# Patient Record
Sex: Female | Born: 1955
Health system: Southern US, Community
[De-identification: ages and names within clinical notes are randomized; demographics above are authoritative.]

## PROBLEM LIST (undated history)

## (undated) DIAGNOSIS — K649 Unspecified hemorrhoids: Secondary | ICD-10-CM

## (undated) DIAGNOSIS — M8580 Other specified disorders of bone density and structure, unspecified site: Secondary | ICD-10-CM

## (undated) DIAGNOSIS — E039 Hypothyroidism, unspecified: Secondary | ICD-10-CM

## (undated) DIAGNOSIS — D229 Melanocytic nevi, unspecified: Secondary | ICD-10-CM

## (undated) DIAGNOSIS — C439 Malignant melanoma of skin, unspecified: Secondary | ICD-10-CM

## (undated) DIAGNOSIS — I10 Essential (primary) hypertension: Secondary | ICD-10-CM

## (undated) DIAGNOSIS — C801 Malignant (primary) neoplasm, unspecified: Secondary | ICD-10-CM

## (undated) DIAGNOSIS — D259 Leiomyoma of uterus, unspecified: Secondary | ICD-10-CM

## (undated) DIAGNOSIS — C4491 Basal cell carcinoma of skin, unspecified: Secondary | ICD-10-CM

## (undated) DIAGNOSIS — F419 Anxiety disorder, unspecified: Secondary | ICD-10-CM

## (undated) DIAGNOSIS — M1990 Unspecified osteoarthritis, unspecified site: Secondary | ICD-10-CM

## (undated) DIAGNOSIS — J309 Allergic rhinitis, unspecified: Secondary | ICD-10-CM

## (undated) DIAGNOSIS — E785 Hyperlipidemia, unspecified: Secondary | ICD-10-CM

## (undated) DIAGNOSIS — T7840XA Allergy, unspecified, initial encounter: Secondary | ICD-10-CM

## (undated) DIAGNOSIS — K602 Anal fissure, unspecified: Secondary | ICD-10-CM

## (undated) HISTORY — DX: Unspecified osteoarthritis, unspecified site: M19.90

## (undated) HISTORY — DX: Anxiety disorder, unspecified: F41.9

## (undated) HISTORY — DX: Unspecified hemorrhoids: K64.9

## (undated) HISTORY — DX: Anal fissure, unspecified: K60.2

## (undated) HISTORY — DX: Allergy, unspecified, initial encounter: T78.40XA

## (undated) HISTORY — DX: Leiomyoma of uterus, unspecified: D25.9

## (undated) HISTORY — DX: Malignant (primary) neoplasm, unspecified: C80.1

## (undated) HISTORY — PX: HEMORRHOID BANDING: SHX5850

## (undated) HISTORY — DX: Allergic rhinitis, unspecified: J30.9

## (undated) HISTORY — DX: Hyperlipidemia, unspecified: E78.5

## (undated) HISTORY — DX: Other specified disorders of bone density and structure, unspecified site: M85.80

## (undated) HISTORY — DX: Hypothyroidism, unspecified: E03.9

## (undated) HISTORY — DX: Essential (primary) hypertension: I10

## (undated) HISTORY — PX: OTHER SURGICAL HISTORY: SHX169

---
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
## 1986-02-12 HISTORY — PX: URETHRAL DILATION: SUR417

## 1993-02-12 HISTORY — PX: ABDOMINAL HYSTERECTOMY: SHX81

## 1997-03-26 ENCOUNTER — Encounter: Payer: Self-pay | Admitting: Family Medicine

## 1997-03-26 LAB — CONVERTED CEMR LAB: Blood Glucose, Fasting: 79 mg/dL

## 1997-06-29 ENCOUNTER — Encounter: Payer: Self-pay | Admitting: Family Medicine

## 1997-06-29 LAB — CONVERTED CEMR LAB: Blood Glucose, Fasting: 81 mg/dL

## 1997-08-20 ENCOUNTER — Ambulatory Visit (HOSPITAL_COMMUNITY): Admission: RE | Admit: 1997-08-20 | Discharge: 1997-08-20 | Payer: Self-pay | Admitting: Obstetrics & Gynecology

## 1999-09-04 ENCOUNTER — Encounter: Payer: Self-pay | Admitting: Family Medicine

## 1999-09-08 ENCOUNTER — Encounter: Payer: Self-pay | Admitting: Family Medicine

## 1999-09-08 LAB — CONVERTED CEMR LAB: TSH: 3.97 microintl units/mL

## 2000-02-13 HISTORY — PX: FLEXIBLE SIGMOIDOSCOPY: SHX1649

## 2001-08-25 ENCOUNTER — Other Ambulatory Visit: Admission: RE | Admit: 2001-08-25 | Discharge: 2001-08-25 | Payer: Self-pay | Admitting: Obstetrics & Gynecology

## 2002-09-24 ENCOUNTER — Other Ambulatory Visit: Admission: RE | Admit: 2002-09-24 | Discharge: 2002-09-24 | Payer: Self-pay | Admitting: Obstetrics & Gynecology

## 2003-07-22 ENCOUNTER — Encounter: Admission: RE | Admit: 2003-07-22 | Discharge: 2003-07-22 | Payer: Self-pay | Admitting: Obstetrics & Gynecology

## 2003-08-13 ENCOUNTER — Encounter: Admission: RE | Admit: 2003-08-13 | Discharge: 2003-08-13 | Payer: Self-pay | Admitting: Obstetrics & Gynecology

## 2003-08-27 ENCOUNTER — Encounter: Admission: RE | Admit: 2003-08-27 | Discharge: 2003-08-27 | Payer: Self-pay | Admitting: Obstetrics & Gynecology

## 2003-09-21 ENCOUNTER — Encounter: Admission: RE | Admit: 2003-09-21 | Discharge: 2003-09-21 | Payer: Self-pay | Admitting: Obstetrics & Gynecology

## 2003-09-29 ENCOUNTER — Other Ambulatory Visit: Admission: RE | Admit: 2003-09-29 | Discharge: 2003-09-29 | Payer: Self-pay | Admitting: Obstetrics & Gynecology

## 2004-10-20 ENCOUNTER — Other Ambulatory Visit: Admission: RE | Admit: 2004-10-20 | Discharge: 2004-10-20 | Payer: Self-pay | Admitting: Obstetrics & Gynecology

## 2004-11-03 ENCOUNTER — Ambulatory Visit: Payer: Self-pay | Admitting: Family Medicine

## 2004-12-18 ENCOUNTER — Ambulatory Visit: Payer: Self-pay | Admitting: Family Medicine

## 2005-05-17 ENCOUNTER — Ambulatory Visit: Payer: Self-pay | Admitting: Family Medicine

## 2005-06-28 ENCOUNTER — Ambulatory Visit: Payer: Self-pay | Admitting: Family Medicine

## 2005-08-28 ENCOUNTER — Ambulatory Visit: Payer: Self-pay | Admitting: Family Medicine

## 2005-08-31 ENCOUNTER — Ambulatory Visit: Payer: Self-pay | Admitting: Family Medicine

## 2005-12-24 ENCOUNTER — Ambulatory Visit: Payer: Self-pay | Admitting: Family Medicine

## 2006-01-11 DIAGNOSIS — Z85828 Personal history of other malignant neoplasm of skin: Secondary | ICD-10-CM

## 2006-01-11 HISTORY — DX: Personal history of other malignant neoplasm of skin: Z85.828

## 2006-01-24 ENCOUNTER — Encounter: Payer: Self-pay | Admitting: Family Medicine

## 2006-01-24 LAB — CONVERTED CEMR LAB: TSH: 6.375 microintl units/mL

## 2006-03-01 ENCOUNTER — Ambulatory Visit: Payer: Self-pay | Admitting: Family Medicine

## 2006-04-02 ENCOUNTER — Ambulatory Visit: Payer: Self-pay | Admitting: Family Medicine

## 2007-01-24 DIAGNOSIS — Z86018 Personal history of other benign neoplasm: Secondary | ICD-10-CM

## 2007-01-24 HISTORY — DX: Personal history of other benign neoplasm: Z86.018

## 2007-01-29 ENCOUNTER — Ambulatory Visit: Payer: Self-pay | Admitting: Family Medicine

## 2007-01-29 ENCOUNTER — Encounter: Payer: Self-pay | Admitting: Family Medicine

## 2007-01-29 DIAGNOSIS — E7800 Pure hypercholesterolemia, unspecified: Secondary | ICD-10-CM | POA: Insufficient documentation

## 2007-01-29 DIAGNOSIS — F329 Major depressive disorder, single episode, unspecified: Secondary | ICD-10-CM | POA: Insufficient documentation

## 2007-01-29 DIAGNOSIS — I10 Essential (primary) hypertension: Secondary | ICD-10-CM | POA: Insufficient documentation

## 2007-02-13 HISTORY — PX: COLONOSCOPY: SHX174

## 2007-03-03 ENCOUNTER — Ambulatory Visit: Payer: Self-pay | Admitting: Gastroenterology

## 2007-03-25 ENCOUNTER — Encounter: Payer: Self-pay | Admitting: Family Medicine

## 2007-03-25 ENCOUNTER — Ambulatory Visit: Payer: Self-pay | Admitting: Gastroenterology

## 2007-06-05 ENCOUNTER — Ambulatory Visit: Payer: Self-pay | Admitting: Family Medicine

## 2007-06-05 LAB — CONVERTED CEMR LAB
ALT: 15 units/L (ref 0–35)
AST: 19 units/L (ref 0–37)
Albumin: 3.5 g/dL (ref 3.5–5.2)
Basophils Absolute: 0 10*3/uL (ref 0.0–0.1)
Cholesterol: 218 mg/dL (ref 0–200)
Eosinophils Absolute: 0.1 10*3/uL (ref 0.0–0.7)
Eosinophils Relative: 1.5 % (ref 0.0–5.0)
GFR calc non Af Amer: 70 mL/min
Glucose, Bld: 89 mg/dL (ref 70–99)
HCT: 38.5 % (ref 36.0–46.0)
Lymphocytes Relative: 20.9 % (ref 12.0–46.0)
Monocytes Relative: 9.5 % (ref 3.0–12.0)
Neutrophils Relative %: 68.1 % (ref 43.0–77.0)
Platelets: 174 10*3/uL (ref 150–400)
Potassium: 4 meq/L (ref 3.5–5.1)
Sodium: 141 meq/L (ref 135–145)
Total Bilirubin: 0.4 mg/dL (ref 0.3–1.2)
VLDL: 15 mg/dL (ref 0–40)

## 2007-06-09 ENCOUNTER — Ambulatory Visit: Payer: Self-pay | Admitting: Family Medicine

## 2007-06-10 ENCOUNTER — Encounter: Payer: Self-pay | Admitting: Family Medicine

## 2007-06-18 ENCOUNTER — Encounter: Payer: Self-pay | Admitting: Family Medicine

## 2008-03-11 ENCOUNTER — Ambulatory Visit: Payer: Self-pay | Admitting: Family Medicine

## 2008-07-14 ENCOUNTER — Ambulatory Visit: Payer: Self-pay | Admitting: Family Medicine

## 2008-07-15 ENCOUNTER — Encounter (INDEPENDENT_AMBULATORY_CARE_PROVIDER_SITE_OTHER): Payer: Self-pay | Admitting: *Deleted

## 2008-07-15 ENCOUNTER — Ambulatory Visit: Payer: Self-pay | Admitting: Family Medicine

## 2008-07-15 LAB — CONVERTED CEMR LAB
AST: 20 units/L (ref 0–37)
Albumin: 3.3 g/dL — ABNORMAL LOW (ref 3.5–5.2)
Calcium: 8.7 mg/dL (ref 8.4–10.5)
Creatinine, Ser: 0.9 mg/dL (ref 0.4–1.2)
Direct LDL: 142.7 mg/dL
Total Bilirubin: 0.8 mg/dL (ref 0.3–1.2)
Total CHOL/HDL Ratio: 3
Total Protein: 6.3 g/dL (ref 6.0–8.3)
Triglycerides: 89 mg/dL (ref 0.0–149.0)

## 2009-02-02 ENCOUNTER — Telehealth: Payer: Self-pay | Admitting: Family Medicine

## 2009-05-09 ENCOUNTER — Ambulatory Visit: Payer: Self-pay | Admitting: Family Medicine

## 2009-05-09 DIAGNOSIS — J018 Other acute sinusitis: Secondary | ICD-10-CM | POA: Insufficient documentation

## 2009-08-01 ENCOUNTER — Ambulatory Visit: Payer: Self-pay | Admitting: Family Medicine

## 2009-08-01 LAB — CONVERTED CEMR LAB
BUN: 20 mg/dL (ref 6–23)
CO2: 30 meq/L (ref 19–32)
Cholesterol: 213 mg/dL — ABNORMAL HIGH (ref 0–200)
Direct LDL: 139.6 mg/dL
GFR calc non Af Amer: 67.61 mL/min (ref >60)
Glucose, Bld: 85 mg/dL (ref 70–99)
HDL: 53.5 mg/dL (ref >39.00)
Total Bilirubin: 0.4 mg/dL (ref 0.3–1.2)
Total Protein: 6.7 g/dL (ref 6.0–8.3)
VLDL: 20.4 mg/dL (ref 0.0–40.0)

## 2009-08-08 ENCOUNTER — Ambulatory Visit: Payer: Self-pay | Admitting: Family Medicine

## 2009-08-08 DIAGNOSIS — J309 Allergic rhinitis, unspecified: Secondary | ICD-10-CM | POA: Insufficient documentation

## 2009-12-19 ENCOUNTER — Ambulatory Visit: Payer: Self-pay | Admitting: Family Medicine

## 2009-12-19 DIAGNOSIS — N3941 Urge incontinence: Secondary | ICD-10-CM | POA: Insufficient documentation

## 2009-12-19 DIAGNOSIS — R3129 Other microscopic hematuria: Secondary | ICD-10-CM | POA: Insufficient documentation

## 2009-12-19 DIAGNOSIS — R109 Unspecified abdominal pain: Secondary | ICD-10-CM | POA: Insufficient documentation

## 2009-12-19 LAB — CONVERTED CEMR LAB
Ketones, urine, test strip: NEGATIVE
Nitrite: NEGATIVE
Specific Gravity, Urine: >=1.03
WBC Urine, dipstick: NEGATIVE
pH: 5

## 2009-12-20 ENCOUNTER — Encounter: Payer: Self-pay | Admitting: Family Medicine

## 2009-12-22 ENCOUNTER — Telehealth: Payer: Self-pay | Admitting: Family Medicine

## 2009-12-29 DIAGNOSIS — C439 Malignant melanoma of skin, unspecified: Secondary | ICD-10-CM

## 2009-12-29 HISTORY — DX: Malignant melanoma of skin, unspecified: C43.9

## 2010-03-16 NOTE — Assessment & Plan Note (Signed)
Summary: ACUTE-SORE SORE THROAT, SINUSES, ETC  CYD   Vital Signs:  Patient profile:   55 year old female Height:      58 inches Weight:      119 pounds BMI:     24.96 Temp:     99 degrees F oral Pulse rate:   88 / minute Pulse rhythm:   regular BP sitting:   108 / 72  (left arm) Cuff size:   regular  Vitals Entered By: Delilah Shan CMA (AAMA) (May 09, 2009 8:16 AM) CC: Cough, sinus congestion.   History of Present Illness: 55 yo with 1 week of sinus pressure, congestion. Started with sore throat and runny nose. Feels symptoms getting worse over last 3 days. Increased frontal pressure, had chills and subjective fever last night. Dry cough. No wheezing or SOB. Ears popping.  Current Medications (verified): 1)  Metoprolol Succinate 50 Mg Xr24h-Tab (Metoprolol Succinate) .... One Tab By Mouth At Night 2)  Cvs Magnesium 250 Mg  Tabs (Magnesium) .Marland Kitchen.. 1 Daily By Mouth 3)  Calcium 1200-1000 Mg-Unit  Chew (Calcium Carbonate-Vit D-Min) .Marland Kitchen.. 1 Daily By Mouth 4)  Vitamin D 1000 Unit  Caps (Cholecalciferol) .Marland Kitchen.. 1 Daily By Mouth 5)  Multivitamins  Tabs (Multiple Vitamin) .Marland Kitchen.. 1 Daily By Mouth 6)  Anusol-Hc 25 Mg Supp (Hydrocortisone Acetate) .... One Supp Per Rectum Three Times A Day For One Week Then As Needed. 7)  Estradiol 1 Mg Tabs (Estradiol) .... Take 1 Tablet By Mouth Once A Day 8)  Augmentin 875-125 Mg Tabs (Amoxicillin-Pot Clavulanate) .Marland Kitchen.. 1 By Mouth 2 Times Daily X 10 Days  Allergies (verified): No Known Drug Allergies  Review of Systems      See HPI General:  Complains of chills and fever. ENT:  Complains of sinus pressure. CV:  Denies chest pain or discomfort. Resp:  Complains of cough; denies shortness of breath, sputum productive, and wheezing.  Physical Exam  General:  Well-developed,well-nourished,in no acute distress; alert,appropriate and cooperative throughout examination Ears:  Mod clear fluid bilat. Nose:  nasal dischargemucosal pallor.   Mouth:  Oral  mucosa and oropharynx without lesions or exudates.  Teeth in good repair. Lungs:  Normal respiratory effort, chest expands symmetrically. Lungs are clear to auscultation, no crackles or wheezes. Heart:  Normal rate and regular rhythm. S1 and S2 normal without gallop, murmur, click, rub or other extra sounds. Extremities:  No clubbing, cyanosis, edema, or deformity noted with normal full range of motion of all joints.   Psych:  Cognition and judgment appear intact. Alert and cooperative with normal attention span and concentration. No apparent delusions, illusions, hallucinations   Impression & Recommendations:  Problem # 1:  OTHER ACUTE SINUSITIS (ICD-461.8) Assessment New Given duration and progression of symptoms, will treat for bacterial sinusitis with Augmetin. Cont supportive care.  See pt instructions for details. Her updated medication list for this problem includes:    Augmentin 875-125 Mg Tabs (Amoxicillin-pot clavulanate) .Marland Kitchen... 1 by mouth 2 times daily x 10 days  Complete Medication List: 1)  Metoprolol Succinate 50 Mg Xr24h-tab (Metoprolol succinate) .... One tab by mouth at night 2)  Cvs Magnesium 250 Mg Tabs (Magnesium) .Marland Kitchen.. 1 daily by mouth 3)  Calcium 1200-1000 Mg-unit Chew (Calcium carbonate-vit d-min) .Marland Kitchen.. 1 daily by mouth 4)  Vitamin D 1000 Unit Caps (Cholecalciferol) .Marland Kitchen.. 1 daily by mouth 5)  Multivitamins Tabs (Multiple vitamin) .Marland Kitchen.. 1 daily by mouth 6)  Anusol-hc 25 Mg Supp (Hydrocortisone acetate) .... One supp per rectum three times  a day for one week then as needed. 7)  Estradiol 1 Mg Tabs (Estradiol) .... Take 1 tablet by mouth once a day 8)  Augmentin 875-125 Mg Tabs (Amoxicillin-pot clavulanate) .Marland Kitchen.. 1 by mouth 2 times daily x 10 days  Patient Instructions: 1)  Take Augmentin as directed.  Drink lots of fluids.  Treat sympotmatically with Mucinex, nasal saline irrigation, and Tylenol/Ibuprofen. Also try claritin D or zyrtec D over the counter- two times a day as  needed ( have to sign for them at pharmacy). You can use warm compresses.  Cough suppressant at night. Call if not improving as expected in 5-7 days.  Prescriptions: AUGMENTIN 875-125 MG TABS (AMOXICILLIN-POT CLAVULANATE) 1 by mouth 2 times daily x 10 days  #20 x 0   Entered and Authorized by:   Ruthe Mannan MD   Signed by:   Ruthe Mannan MD on 05/09/2009   Method used:   Electronically to        [facility] (retail)       [address]       [place], [place]  [address]       Ph: [phone]       Fax: [phone]   RxID:   [id number]   Current Allergies (reviewed today): No known allergies

## 2010-03-16 NOTE — Assessment & Plan Note (Signed)
Summary: CPX AND ESTAB FROM [facility]   Vital Signs:  Patient profile:   55 year old female Height:      58.5 inches Weight:      118 pounds Temp:     98.6 degrees F oral Pulse rate:   64 / minute Pulse rhythm:   regular BP sitting:   122 / 80  (left arm) Cuff size:   regular  Vitals Entered By: Lowella Petties CMA (August 08, 2009 9:23 AM) CC: New patient to establish, transfer from Dr. Hetty Ely   History of Present Illness: 55 yo here for CPX and to transfer care to me.  HTN- on Metoprolol 50 mg daily. Over past several months, has noticed that her BP is running low, sometimes as low as 80s/60s, which makes her feel really bad. She gets dizzy and very fatigued. She has improved her diet and exercise since she started this higher dose of metoprolol  (was previously on 25 mg daily).  No CP, SOB, fevers or chills.  Well woman- goes to OBGYN for annual pap (s/p hysterectomy?) and mammogram. UTD on all prevention.  lipid panel stable from prior years.  Current Medications (verified): 1)  Cvs Magnesium 250 Mg  Tabs (Magnesium) .Marland Kitchen.. 1 Daily By Mouth 2)  Calcium 1200-1000 Mg-Unit  Chew (Calcium Carbonate-Vit D-Min) .Marland Kitchen.. 1 Daily By Mouth 3)  Vitamin D 1000 Unit  Caps (Cholecalciferol) .Marland Kitchen.. 1 Daily By Mouth 4)  Multivitamins  Tabs (Multiple Vitamin) .Marland Kitchen.. 1 Daily By Mouth 5)  Anusol-Hc 25 Mg Supp (Hydrocortisone Acetate) .... One Supp Per Rectum Three Times A Day For One Week Then As Needed. 6)  Estradiol 1 Mg Tabs (Estradiol) .... Take 1 Tablet By Mouth Once A Day 7)  Metoprolol Succinate 25 Mg Tb24 (Metoprolol Succinate) .... Take 1 Tab  By Mouth Daily  Allergies (verified): No Known Drug Allergies  Past History:  Past Surgical History: Last updated: 04/04/2007 PARTIAL HYSTERECTOMY; FIBROIDS :(1995) DR. Gerre Scull DILITATION :(1988) DR.BRADLEY FLEX TO 40 CM // INTERNAL AND EXTERNAL HEMORRHOIDS:(10/01/2000) COLONOSCOPY  SM EXT HEMMS  (03/25/2007)   10 YEARS  Family  History: Last updated: 08-03-08 Father: DECEASED AT 65 YOA CIRRHOSIS DUE TO ETOH ABUSE Mother: 58  HTN  Chol SISTER A 51 SISTER A 50 HTN CV: NEGATIVE HBP + MOTHER// MAUNT DM: NEGATIVE GOUT/ARTHRITIS: PROSTATE CANCER: NEGATIVE BREAST/OVARIAN/UTERINE CANCER: NEGATIVE COLON CANCER: LUNG --PAUNT DEPRESSION: ETOH/DRUG ABUSE: + FATHER  Social History: Last updated: 06/09/2007 Marital Status: DIVORCED Children: NONE Occupation: Conservator, museum/gallery asst, Community education officer.  Risk Factors: Alcohol Use: 0 (08-03-2008) Caffeine Use: 2 (Aug 03, 2008) Exercise: yes (08-03-2008)  Risk Factors: Smoking Status: quit (Aug 03, 2008) Packs/Day: 1991 (2008/08/03) Passive Smoke Exposure: no (08/03/2008)  Review of Systems      See HPI General:  Complains of fatigue. Eyes:  Denies blurring. ENT:  Denies difficulty swallowing. CV:  Denies chest pain or discomfort. Resp:  Denies shortness of breath. GI:  Denies abdominal pain, bloody stools, and change in bowel habits. GU:  Denies abnormal vaginal bleeding. MS:  Denies joint pain, joint redness, joint swelling, and muscle weakness. Derm:  Denies rash. Neuro:  Denies headaches. Psych:  Denies anxiety and depression. Endo:  Denies cold intolerance and heat intolerance. Heme:  Denies abnormal bruising.  Physical Exam  General:  Well-developed,well-nourished,in no acute distress; alert,appropriate and cooperative throughout examination Head:  Normocephalic and atraumatic without obvious abnormalities. No apparent alopecia or balding. Eyes:  Conjunctiva clear bilaterally.  Ears:  Mod clear fluid bilat. Nose:  nasal dischargemucosal pallor.  Mouth:  Oral mucosa and oropharynx without lesions or exudates.  Teeth in good repair. Lungs:  Normal respiratory effort, chest expands symmetrically. Lungs are clear to auscultation, no crackles or wheezes. Heart:  Normal rate and regular rhythm. S1 and S2 normal without gallop, murmur, click, rub or other extra  sounds. Abdomen:  Bowel sounds positive,abdomen soft and non-tender without masses, organomegaly or hernias noted. Msk:  No deformity or scoliosis noted of thoracic or lumbar spine.   Extremities:  No clubbing, cyanosis, edema, or deformity noted with normal full range of motion of all joints.   Neurologic:  No cranial nerve deficits noted. Station and gait are normal. Plantar reflexes are down-going bilaterally. DTRs are symmetrical throughout. Sensory, motor and coordinative functions appear intact. Skin:  Intact without suspicious lesions or rashes Psych:  Cognition and judgment appear intact. Alert and cooperative with normal attention span and concentration. No apparent delusions, illusions, hallucinations   Impression & Recommendations:  Problem # 1:  Preventive Health Care (ICD-V70.0) Reviewed preventive care protocols, scheduled due services, and updated immunizations Discussed nutrition, exercise, diet, and healthy lifestyle.  UTD on prevention.  Problem # 2:  HYPERTENSION, BENIGN ESSENTIAL (ICD-401.1) Assessment: Improved with new dizziness and fatigue. Will decrease metoprolol dose to 25 mg daily and have her call me in a few weeks with update of her BP. May eventually be able to stop the medication completely. The following medications were removed from the medication list:    Metoprolol Succinate 50 Mg Xr24h-tab (Metoprolol succinate) ..... One tab by mouth at night Her updated medication list for this problem includes:    Metoprolol Succinate 25 Mg Tb24 (Metoprolol succinate) .Marland Kitchen... Take 1 tab  by mouth daily  Problem # 3:  ALLERGIC RHINITIS CAUSE UNSPECIFIED (ICD-477.9) Assessment: New Discussed using claritin or other medications safe for a hypertensive.  Complete Medication List: 1)  Cvs Magnesium 250 Mg Tabs (Magnesium) .Marland Kitchen.. 1 daily by mouth 2)  Calcium 1200-1000 Mg-unit Chew (Calcium carbonate-vit d-min) .Marland Kitchen.. 1 daily by mouth 3)  Vitamin D 1000 Unit Caps  (Cholecalciferol) .Marland Kitchen.. 1 daily by mouth 4)  Multivitamins Tabs (Multiple vitamin) .Marland Kitchen.. 1 daily by mouth 5)  Anusol-hc 25 Mg Supp (Hydrocortisone acetate) .... One supp per rectum three times a day for one week then as needed. 6)  Estradiol 1 Mg Tabs (Estradiol) .... Take 1 tablet by mouth once a day 7)  Metoprolol Succinate 25 Mg Tb24 (Metoprolol succinate) .... Take 1 tab  by mouth daily Prescriptions: METOPROLOL SUCCINATE 25 MG TB24 (METOPROLOL SUCCINATE) Take 1 tab  by mouth daily  #180 x 3   Entered and Authorized by:   Ruthe Mannan MD   Signed by:   Ruthe Mannan MD on 08/08/2009   Method used:   Electronically to        [facility] (retail)       [address]       [place], [place]  [address]       Ph: [phone]       Fax: [phone]   RxID:   [id number]   Prior Medications (reviewed today): [facility]S MAGNESIUM 250 MG  TABS (MAGNESIUM) 1 daily by mouth CALCIUM 1200-1000 MG-UNIT  CHEW (CALCIUM CARBONATE-VIT D-MIN) 1 daily by mouth VITAMIN D 1000 UNIT  CAPS (CHOLECALCIFEROL) 1 daily by mouth MULTIVITAMINS  TABS (MULTIPLE VITAMIN) 1 daily by mouth ANUSOL-HC 25 MG SUPP (HYDROCORTISONE ACETATE) one supp per rectum three times a day for  one week then as needed. ESTRADIOL 1 MG TABS (ESTRADIOL) Take 1 tablet by mouth once a day Current Allergies (reviewed today): No known allergies   Flex Sig Next Due:  Not Indicated Hemoccult Next Due:  Not Indicated

## 2010-03-16 NOTE — Progress Notes (Signed)
Summary: Pt request call back  Phone Note Call from Patient   Reason for Call: Talk to Nurse Details for Reason: Pt requested call back Summary of Call: Pt left voice mail and requesting call back from nurse/doctor.  Pt asked to be called at work # [phone] Initial call taken by: Clarisa Schools,  December 22, 2009 9:15 AM  Follow-up for Phone Call        Patient advised as instructed via telephone regarding urine culture results.  She has an appt to see her gyn on 01/12/2010 and she will wait until after that appt to decide whether or not she wants a referral to urology.  See append dated 12/21/2009. Follow-up by: Linde Gillis CMA Duncan Dull),  December 22, 2009 9:26 AM

## 2010-03-16 NOTE — Assessment & Plan Note (Signed)
Summary: ?UTI/CLE   Vital Signs:  Patient profile:   55 year old female Height:      58.5 inches Weight:      121 pounds BMI:     24.95 Temp:     98.9 degrees F oral Pulse rate:   68 / minute Pulse rhythm:   regular BP sitting:   112 / 80  (left arm) Cuff size:   regular  Vitals Entered By: Linde Gillis CMA Duncan Dull) (December 19, 2009 10:50 AM) CC: ? UTI   History of Present Illness: 55 yo here with ? UTI.  One week ago, suprapubic pressure which resolved. H/o increased urinary frequency for sometime.  Sometimes does not make it to the bathroom. No incontinence with straing, cough or sneezing.  No back pain. Does have h/o kidney stones and h/o microscopic hematuria.  No fevers, chills, nausea or vomiting.    Current Medications (verified): 1)  Cvs Magnesium 250 Mg  Tabs (Magnesium) .Marland Kitchen.. 1 Daily By Mouth 2)  Calcium 1200-1000 Mg-Unit  Chew (Calcium Carbonate-Vit D-Min) .Marland Kitchen.. 1 Daily By Mouth 3)  Vitamin D 1000 Unit  Caps (Cholecalciferol) .Marland Kitchen.. 1 Daily By Mouth 4)  Multivitamins  Tabs (Multiple Vitamin) .Marland Kitchen.. 1 Daily By Mouth 5)  Anusol-Hc 25 Mg Supp (Hydrocortisone Acetate) .... One Supp Per Rectum Three Times A Day For One Week Then As Needed. 6)  Estradiol 1 Mg Tabs (Estradiol) .... Take 1 Tablet By Mouth Once A Day 7)  Metoprolol Succinate 25 Mg Tb24 (Metoprolol Succinate) .... Take 1 Tab  By Mouth Daily 8)  Detrol La 2 Mg  Cp24 (Tolterodine Tartrate) .Marland Kitchen.. 1 Tablet By Mouth Daily  Allergies (verified): No Known Drug Allergies  Past History:  Past Surgical History: Last updated: 04/04/2007 PARTIAL HYSTERECTOMY; FIBROIDS :(1995) DR. Gerre Scull DILITATION :(1988) DR.BRADLEY FLEX TO 40 CM // INTERNAL AND EXTERNAL HEMORRHOIDS:(10/01/2000) COLONOSCOPY  SM EXT HEMMS  (03/25/2007)   10 YEARS  Family History: Last updated: 2008/07/20 Father: DECEASED AT 78 YOA CIRRHOSIS DUE TO ETOH ABUSE Mother: 27  HTN  Chol SISTER A 51 SISTER A 50 HTN CV: NEGATIVE HBP + MOTHER//  MAUNT DM: NEGATIVE GOUT/ARTHRITIS: PROSTATE CANCER: NEGATIVE BREAST/OVARIAN/UTERINE CANCER: NEGATIVE COLON CANCER: LUNG --PAUNT DEPRESSION: ETOH/DRUG ABUSE: + FATHER  Social History: Last updated: 06/09/2007 Marital Status: DIVORCED Children: NONE Occupation: Conservator, museum/gallery asst, Community education officer.  Risk Factors: Alcohol Use: 0 (2008/07/20) Caffeine Use: 2 (2008/07/20) Exercise: yes (2008-07-20)  Risk Factors: Smoking Status: quit (07-20-08) Packs/Day: 1991 (07-20-2008) Passive Smoke Exposure: no (2008-07-20)  Review of Systems      See HPI General:  Denies chills and fever. GI:  Denies abdominal pain.  Physical Exam  General:  Well-developed,well-nourished,in no acute distress; alert,appropriate and cooperative throughout examination Abdomen:  Bowel sounds positive,abdomen soft and non-tender without masses, organomegaly or hernias noted. NO suprapubic or CVA tenderness Psych:  Cognition and judgment appear intact. Alert and cooperative with normal attention span and concentration. No apparent delusions, illusions, hallucinations   Impression & Recommendations:  Problem # 1:  MICROSCOPIC HEMATURIA (ICD-599.72) Assessment New Per pt, recurrent issue.  will send urine for cultlure. Strongly adivsed urological work up.  Pt will consider.  Problem # 2:  INCONTINENCE, URGE (ICD-788.31) Assessment: New Will start Detrol 2 mg daily. Discussed side effects.  Complete Medication List: 1)  Cvs Magnesium 250 Mg Tabs (Magnesium) .Marland Kitchen.. 1 daily by mouth 2)  Calcium 1200-1000 Mg-unit Chew (Calcium carbonate-vit d-min) .Marland Kitchen.. 1 daily by mouth 3)  Vitamin D 1000 Unit Caps (Cholecalciferol) .Marland KitchenMarland KitchenMarland Kitchen  1 daily by mouth 4)  Multivitamins Tabs (Multiple vitamin) .Marland Kitchen.. 1 daily by mouth 5)  Anusol-hc 25 Mg Supp (Hydrocortisone acetate) .... One supp per rectum three times a day for one week then as needed. 6)  Estradiol 1 Mg Tabs (Estradiol) .... Take 1 tablet by mouth once a day 7)  Metoprolol  Succinate 25 Mg Tb24 (Metoprolol succinate) .... Take 1 tab  by mouth daily 8)  Detrol La 2 Mg Cp24 (Tolterodine tartrate) .Marland Kitchen.. 1 tablet by mouth daily  Other Orders: UA Dipstick w/o Micro (manual) (71696) T-Culture, Urine ([id number]) Prescriptions: DETROL LA 2 MG  CP24 (TOLTERODINE TARTRATE) 1 tablet by mouth daily  #90 x 3   Entered and Authorized by:   Ruthe Mannan MD   Signed by:   Ruthe Mannan MD on 12/19/2009   Method used:   Electronically to        [facility] (retail)       [address]       [place], [place]  [address]       Ph: [phone]       Fax: [phone]   RxID:   [id number]    Orders Added: 1)  UA Dipstick w/o Micro (manual) [81002] 2)  T-Culture, Urine [[id number]] 3)  Est. Patient Level IV [25053]    Current Allergies (reviewed today): No known allergies   Laboratory Results   Urine Tests  Date/Time Received: December 19, 2009 10:59 AM   Routine Urinalysis   Color: yellow Appearance: Clear Glucose: negative   (Normal Range: Negative) Bilirubin: negative   (Normal Range: Negative) Ketone: negative   (Normal Range: Negative) Spec. Gravity: >=1.030   (Normal Range: 1.003-1.035) Blood: small   (Normal Range: Negative) pH: 5.0   (Normal Range: 5.0-8.0) Protein: trace   (Normal Range: Negative) Urobilinogen: 0.2   (Normal Range: 0-1) Nitrite: negative   (Normal Range: Negative) Leukocyte Esterace: negative   (Normal Range: Negative)       Appended Document: ?UTI/CLE

## 2010-08-21 ENCOUNTER — Other Ambulatory Visit: Payer: Self-pay | Admitting: *Deleted

## 2010-08-21 MED ORDER — METOPROLOL TARTRATE 25 MG PO TABS: 25.0000 mg | ORAL_TABLET | Freq: Two times a day (BID) | ORAL | Status: DC

## 2010-08-22 ENCOUNTER — Telehealth: Payer: Self-pay | Admitting: *Deleted

## 2010-08-22 MED ORDER — METOPROLOL SUCCINATE ER 25 MG PO TB24: 25.0000 mg | ORAL_TABLET | Freq: Every day | ORAL | Status: DC

## 2010-08-22 NOTE — Telephone Encounter (Signed)
Called pharmacy to call in Rx for Metoprolol Tartrate, was advised by pharmacist that patient is on succiante not tartrate.  Please advise.  The fax we received as for Tartrate.  Please advise.

## 2010-08-22 NOTE — Telephone Encounter (Signed)
Rx changed in Epic, rx sent to pharmacy.

## 2010-08-22 NOTE — Telephone Encounter (Signed)
Ok to switch to succinate.  Please change rx in epic. Thanks

## 2010-08-29 ENCOUNTER — Other Ambulatory Visit: Payer: Self-pay | Admitting: Family Medicine

## 2010-08-29 DIAGNOSIS — E7800 Pure hypercholesterolemia, unspecified: Secondary | ICD-10-CM

## 2010-08-29 DIAGNOSIS — Z0000 Encounter for general adult medical examination without abnormal findings: Secondary | ICD-10-CM

## 2010-09-04 ENCOUNTER — Other Ambulatory Visit (INDEPENDENT_AMBULATORY_CARE_PROVIDER_SITE_OTHER): Payer: BC Managed Care – PPO | Admitting: Family Medicine

## 2010-09-04 DIAGNOSIS — Z0000 Encounter for general adult medical examination without abnormal findings: Secondary | ICD-10-CM

## 2010-09-04 DIAGNOSIS — E7800 Pure hypercholesterolemia, unspecified: Secondary | ICD-10-CM

## 2010-09-04 LAB — LIPID PANEL
HDL: 77.9 mg/dL (ref >39.00)
Total CHOL/HDL Ratio: 3

## 2010-09-04 LAB — BASIC METABOLIC PANEL
CO2: 27 mEq/L (ref 19–32)
Calcium: 8.7 mg/dL (ref 8.4–10.5)
Creatinine, Ser: 1 mg/dL (ref 0.4–1.2)
GFR: 58.45 mL/min — ABNORMAL LOW (ref >60.00)

## 2010-09-04 LAB — LDL CHOLESTEROL, DIRECT: Direct LDL: 128.4 mg/dL

## 2010-09-08 ENCOUNTER — Encounter: Payer: Self-pay | Admitting: Family Medicine

## 2010-09-12 ENCOUNTER — Ambulatory Visit (INDEPENDENT_AMBULATORY_CARE_PROVIDER_SITE_OTHER): Payer: BC Managed Care – PPO | Admitting: Family Medicine

## 2010-09-12 ENCOUNTER — Encounter: Payer: Self-pay | Admitting: Family Medicine

## 2010-09-12 VITALS — BP 120/80 | HR 79 | Temp 98.6°F | Ht 58.75 in | Wt 115.5 lb

## 2010-09-12 DIAGNOSIS — Z0000 Encounter for general adult medical examination without abnormal findings: Secondary | ICD-10-CM | POA: Insufficient documentation

## 2010-09-12 DIAGNOSIS — I10 Essential (primary) hypertension: Secondary | ICD-10-CM

## 2010-09-12 DIAGNOSIS — E7800 Pure hypercholesterolemia, unspecified: Secondary | ICD-10-CM

## 2010-09-12 MED ORDER — METOPROLOL SUCCINATE 12.5 MG HALF TABLET: 12.5000 mg | ORAL_TABLET | Freq: Every day | ORAL | Status: DC

## 2010-09-12 NOTE — Progress Notes (Signed)
55 yo here for CPX .  HTN- on Metoprolol 25 mg daily, we cut back from 50 mg daily last year. No dizziness but does question if she can continue to cut back as BP has been great. Lab Results  Component Value Date   CREATININE 1.0 09/04/2010    Well woman- goes to OBGYN for annual pap (s/p hysterectomy?) and mammogram. UTD on all prevention.   Patient Active Problem List  Diagnoses  . PURE HYPERCHOLESTEROLEMIA  . DEPRESSION, MILD  . HYPERTENSION, BENIGN ESSENTIAL  . OTHER ACUTE SINUSITIS  . ALLERGIC RHINITIS CAUSE UNSPECIFIED  . MICROSCOPIC HEMATURIA  . INCONTINENCE, URGE  . ABDOMINAL PAIN OTHER SPECIFIED SITE   No past medical history on file. Past Surgical History  Procedure Date  . Abdominal hysterectomy 1995    partial, fibroids, Dr. Jennette Kettle  . Urethral dilation 1988    Dr. Elige Radon   History  Substance Use Topics  . Smoking status: Not on file  . Smokeless tobacco: Not on file  . Alcohol Use:    Family History  Problem Relation Age of Onset  . Hypertension Mother   . Hyperlipidemia Mother   . Alcohol abuse Father   . Cirrhosis Father   . Hypertension Maternal Aunt   . Cancer Paternal Aunt     lung  . Hypertension Sister    Allergies not on file Current Outpatient Prescriptions on File Prior to Visit  Medication Sig Dispense Refill  . Calcium 1200-1000 MG-UNIT CHEW Chew 1 capsule by mouth daily.        . cholecalciferol (VITAMIN D) 1000 UNITS tablet Take 1,000 Units by mouth daily.        Marland Kitchen estradiol (ESTRACE) 1 MG tablet Take 1 mg by mouth daily.        . hydrocortisone (ANUSOL-HC) 25 MG suppository Place 25 mg rectally as needed.        . Magnesium (CVS MAGNESIUM) 250 MG TABS Take 1 tablet by mouth daily.        . metoprolol succinate (TOPROL-XL) 25 MG 24 hr tablet Take 1 tablet (25 mg total) by mouth daily.  30 tablet  0  . Multiple Vitamin (MULTIVITAMIN) tablet Take 1 tablet by mouth daily.        Marland Kitchen tolterodine (DETROL LA) 2 MG 24 hr capsule Take 2 mg by  mouth daily.         The PMH, PSH, Social History, Family History, Medications, and allergies have been reviewed in [facility], and have been updated if relevant.   Review of Systems       See HPI General:  Complains of fatigue. Eyes:  Denies blurring. ENT:  Denies difficulty swallowing. CV:  Denies chest pain or discomfort. Resp:  Denies shortness of breath. GI:  Denies abdominal pain, bloody stools, and change in bowel habits. GU:  Denies abnormal vaginal bleeding. MS:  Denies joint pain, joint redness, joint swelling, and muscle weakness. Derm:  Denies rash. Neuro:  Denies headaches. Psych:  Denies anxiety and depression. Endo:  Denies cold intolerance and heat intolerance. Heme:  Denies abnormal bruising.  Physical Exam BP 120/80  Pulse 79  Temp(Src) 98.6 F (37 C) (Oral)  Ht 4' 10.75" (1.492 m)  Wt 115 lb 8 oz (52.39 kg)  BMI 23.53 kg/m2  General:  Well-developed,well-nourished,in no acute distress; alert,appropriate and cooperative throughout examination Head:  Normocephalic and atraumatic without obvious abnormalities. No apparent alopecia or balding. Eyes:  Conjunctiva clear bilaterally.  Ears:  Mod clear fluid bilat.  Nose:  nasal dischargemucosal pallor.   Mouth:  Oral mucosa and oropharynx without lesions or exudates.  Teeth in good repair. Lungs:  Normal respiratory effort, chest expands symmetrically. Lungs are clear to auscultation, no crackles or wheezes. Heart:  Normal rate and regular rhythm. S1 and S2 normal without gallop, murmur, click, rub or other extra sounds. Abdomen:  Bowel sounds positive,abdomen soft and non-tender without masses, organomegaly or hernias noted. Msk:  No deformity or scoliosis noted of thoracic or lumbar spine.   Extremities:  No clubbing, cyanosis, edema, or deformity noted with normal full range of motion of all joints.   Neurologic:  No cranial nerve deficits noted. Station and gait are normal. Plantar reflexes are down-going bilaterally.  DTRs are symmetrical throughout. Sensory, motor and coordinative functions appear intact. Skin:  Intact without suspicious lesions or rashes Psych:  Cognition and judgment appear intact. Alert and cooperative with normal attention span and concentration. No apparent delusions, illusions, hallucinations  Assessment and Plan: 1. Pure hypercholesterolemia  Lipids excellent, discussed results with pt.  2. HYPERTENSION, BENIGN ESSENTIAL  Stable.  Will decrease to 12.5 mg daily.  3. Routine general medical examination at a health care facility        Reviewed preventive care protocols, scheduled due services, and updated immunizations      Discussed nutrition, exercise, diet, and healthy lifestyle.

## 2010-10-06 ENCOUNTER — Encounter: Payer: Self-pay | Admitting: Family Medicine

## 2010-10-06 ENCOUNTER — Ambulatory Visit (INDEPENDENT_AMBULATORY_CARE_PROVIDER_SITE_OTHER): Payer: BC Managed Care – PPO | Admitting: Family Medicine

## 2010-10-06 VITALS — BP 130/90 | HR 84 | Temp 98.2°F | Wt 118.8 lb

## 2010-10-06 DIAGNOSIS — J329 Chronic sinusitis, unspecified: Secondary | ICD-10-CM

## 2010-10-06 MED ORDER — AMOXICILLIN 500 MG PO CAPS: 500.0000 mg | ORAL_CAPSULE | Freq: Two times a day (BID) | ORAL | Status: AC

## 2010-10-06 NOTE — Progress Notes (Signed)
SUBJECTIVE:  Terri Espinoza is a 55 y.o. female who complains of coryza, congestion, headache and teeth hurting for 21 days. She denies a history of fatigue, fevers, sweats, vomiting and weakness and denies a history of asthma. Patient denies smoke cigarettes. Using netty pot and claritin OTC   OBJECTIVE: BP 130/90  Pulse 84  Temp(Src) 98.2 F (36.8 C) (Oral)  Wt 118 lb 12 oz (53.865 kg)  She appears well, vital signs are as noted. Ears normal.  Throat and pharynx normal.  Neck supple. No adenopathy in the neck. Nose is congested. Sinuses non tender. The chest is clear, without wheezes or rales.  ASSESSMENT:  sinusitis  PLAN: Given duration and progression of symptoms, will treat for bacterial sinusitis with amoxicillin 500 mg twice daily x 10 days. Symptomatic therapy suggested: push fluids, rest and return office visit prn if symptoms persist or worsen. Call or return to clinic prn if these symptoms worsen or fail to improve as anticipated.

## 2010-10-24 ENCOUNTER — Telehealth: Payer: Self-pay | Admitting: *Deleted

## 2010-10-24 MED ORDER — METOPROLOL SUCCINATE ER 25 MG PO TB24: 25.0000 mg | ORAL_TABLET | Freq: Every day | ORAL | Status: DC

## 2010-10-24 NOTE — Telephone Encounter (Signed)
Pt states her metoprolol was cut back to half of a 25mg  tablet but now her BP is going up.  She has gotten readings of 148/102, 160/108.  She thinks her dose needs to go back up up.  She took 25 mg's yesterday and it went back down to 125/82.  Uses [facility].

## 2010-10-24 NOTE — Telephone Encounter (Signed)
Yes please increase dosage back 25 mg daily.

## 2010-10-24 NOTE — Telephone Encounter (Signed)
Patient advised as instructed via telephone.  Medication updated in Epic, Rx sent to pharmacy.

## 2011-06-05 ENCOUNTER — Other Ambulatory Visit: Payer: Self-pay | Admitting: *Deleted

## 2011-06-05 MED ORDER — METOPROLOL SUCCINATE ER 25 MG PO TB24: 25.0000 mg | ORAL_TABLET | Freq: Every day | ORAL | Status: DC

## 2011-07-02 ENCOUNTER — Encounter: Payer: Self-pay | Admitting: Family Medicine

## 2011-07-02 ENCOUNTER — Ambulatory Visit (INDEPENDENT_AMBULATORY_CARE_PROVIDER_SITE_OTHER): Payer: BC Managed Care – PPO | Admitting: Family Medicine

## 2011-07-02 VITALS — BP 124/80 | HR 64 | Temp 98.0°F | Wt 118.0 lb

## 2011-07-02 DIAGNOSIS — L237 Allergic contact dermatitis due to plants, except food: Secondary | ICD-10-CM | POA: Insufficient documentation

## 2011-07-02 DIAGNOSIS — L255 Unspecified contact dermatitis due to plants, except food: Secondary | ICD-10-CM

## 2011-07-02 MED ORDER — PREDNISONE 20 MG PO TABS: ORAL_TABLET | ORAL | Status: DC

## 2011-07-02 NOTE — Patient Instructions (Signed)
Poison Oak Poison oak is an inflammation of the skin (contact dermatitis). It is caused by contact with the allergens on the leaves of the oak (toxicodendron) plants. Depending on your sensitivity, the rash may consist simply of redness and itching, or it may also progress to blisters which may break open (rupture). These must be well cared for to prevent secondary germ (bacterial) infection as these infections can lead to scarring. The eyes may also get puffy. The puffiness is worst in the morning and gets better as the day progresses. Healing is best accomplished by keeping any open areas dry, clean, covered with a bandage, and covered with an antibacterial ointment if needed. Without secondary infection, this dermatitis usually heals without scarring within 2 to 3 weeks without treatment. HOME CARE INSTRUCTIONS When you have been exposed to poison oak, it is very important to thoroughly wash with soap and water as soon as the exposure has been discovered. You have about one half hour to remove the plant resin before it will cause the rash. This cleaning will quickly destroy the oil or antigen on the skin (the antigen is what causes the rash). Wash aggressively under the fingernails as any plant resin still there will continue to spread the rash. Do not rub skin vigorously when washing affected area. Poison oak cannot spread if no oil from the plant remains on your body. Rash that has progressed to weeping sores (lesions) will not spread the rash unless you have not washed thoroughly. It is also important to clean any clothes you have been wearing as they may carry active allergens which will spread the rash, even several days later. Avoidance of the plant in the future is the best measure. Poison oak plants can be recognized by the number of leaves. Generally, poison oak has three leaves with flowering branches on a single stem. Diphenhydramine may be purchased over the counter and used as needed for  itching. Do not drive with this medication if it makes you drowsy. Ask your caregiver about medication for children. SEEK IMMEDIATE MEDICAL CARE IF:   Open areas of the rash develop.   You notice redness extending beyond the area of the rash.   There is a pus like discharge.   There is increased pain.   Other signs of infection develop (such as fever).  Document Released: 08/05/2002 Document Revised: 01/18/2011 Document Reviewed: 12/15/2008 [employer] Patient Information 2012 [employer]. 

## 2011-07-02 NOTE — Progress Notes (Signed)
  Subjective:    Patient ID: Terri Espinoza, female    DOB: 13-Sep-1955, 56 y.o.   MRN: [id number]  HPI  56 yo was working in yard over the weekend, pulling weeds.  Noticed a little itchy spot, raised on inner thighs bilaterally, now has spread to left arm, bilateral hands, abdomen.  Very itchy.  No wheezing or SOB.  Patient Active Problem List  Diagnoses  . PURE HYPERCHOLESTEROLEMIA  . DEPRESSION, MILD  . HYPERTENSION, BENIGN ESSENTIAL  . OTHER ACUTE SINUSITIS  . ALLERGIC RHINITIS CAUSE UNSPECIFIED  . MICROSCOPIC HEMATURIA  . INCONTINENCE, URGE  . ABDOMINAL PAIN OTHER SPECIFIED SITE  . Routine general medical examination at a health care facility   No past medical history on file. Past Surgical History  Procedure Date  . Abdominal hysterectomy 1995    partial, fibroids, Dr. Jennette Kettle  . Urethral dilation 1988    Dr. Elige Radon   History  Substance Use Topics  . Smoking status: Never Smoker   . Smokeless tobacco: Not on file  . Alcohol Use: Not on file   Family History  Problem Relation Age of Onset  . Hypertension Mother   . Hyperlipidemia Mother   . Alcohol abuse Father   . Cirrhosis Father   . Hypertension Maternal Aunt   . Cancer Paternal Aunt     lung  . Hypertension Sister    No Known Allergies Current Outpatient Prescriptions on File Prior to Visit  Medication Sig Dispense Refill  . Calcium 1200-1000 MG-UNIT CHEW Chew 1 capsule by mouth daily.        . cholecalciferol (VITAMIN D) 1000 UNITS tablet Take 1,000 Units by mouth daily.        Marland Kitchen estradiol (ESTRACE) 1 MG tablet Take 1 mg by mouth daily.        . hydrocortisone (ANUSOL-HC) 25 MG suppository Place 25 mg rectally as needed.        . metoprolol succinate (TOPROL-XL) 25 MG 24 hr tablet Take 1 tablet (25 mg total) by mouth daily.  30 tablet  3  . Multiple Vitamin (MULTIVITAMIN) tablet Take 1 tablet by mouth daily.         The PMH, PSH, Social History, Family History, Medications, and allergies have been  reviewed in [facility], and have been updated if relevant.    Review of Systems    See HPI Objective:   Physical Exam BP 124/80  Pulse 64  Temp(Src) 98 F (36.7 C) (Oral)  Wt 118 lb (53.524 kg) Gen:  Alert, pleasant, NAD Skin: multiple raised, linear, erythematous lesions consistent with contact dermatitis on left arm, bilaterally hands, inner thighs and abdomen.  No surrounding erythema or drainage.     Assessment & Plan:   1. Plant allergic contact dermatitis    New- see pt instructions for details. Given wide spread lesions- treat with oral prednisone taper.  Continue antihistamines as directed. The patient indicates understanding of these issues and agrees with the plan.

## 2011-10-02 ENCOUNTER — Other Ambulatory Visit: Payer: Self-pay | Admitting: Family Medicine

## 2011-11-30 ENCOUNTER — Other Ambulatory Visit: Payer: Self-pay | Admitting: Family Medicine

## 2011-11-30 DIAGNOSIS — E7800 Pure hypercholesterolemia, unspecified: Secondary | ICD-10-CM

## 2011-11-30 DIAGNOSIS — Z0000 Encounter for general adult medical examination without abnormal findings: Secondary | ICD-10-CM

## 2011-11-30 DIAGNOSIS — I10 Essential (primary) hypertension: Secondary | ICD-10-CM

## 2011-12-03 ENCOUNTER — Other Ambulatory Visit (INDEPENDENT_AMBULATORY_CARE_PROVIDER_SITE_OTHER): Payer: BC Managed Care – PPO

## 2011-12-03 DIAGNOSIS — E7800 Pure hypercholesterolemia, unspecified: Secondary | ICD-10-CM

## 2011-12-03 DIAGNOSIS — I10 Essential (primary) hypertension: Secondary | ICD-10-CM

## 2011-12-03 LAB — COMPREHENSIVE METABOLIC PANEL
AST: 18 U/L (ref 0–37)
BUN: 17 mg/dL (ref 6–23)
Calcium: 8.8 mg/dL (ref 8.4–10.5)
Chloride: 105 mEq/L (ref 96–112)
Creatinine, Ser: 0.9 mg/dL (ref 0.4–1.2)

## 2011-12-03 LAB — LIPID PANEL
Cholesterol: 239 mg/dL — ABNORMAL HIGH (ref 0–200)
HDL: 63.2 mg/dL (ref >39.00)
Triglycerides: 118 mg/dL (ref 0.0–149.0)

## 2011-12-03 LAB — LDL CHOLESTEROL, DIRECT: Direct LDL: 140 mg/dL

## 2011-12-10 ENCOUNTER — Encounter: Payer: Self-pay | Admitting: Family Medicine

## 2011-12-10 ENCOUNTER — Ambulatory Visit (INDEPENDENT_AMBULATORY_CARE_PROVIDER_SITE_OTHER): Payer: BC Managed Care – PPO | Admitting: Family Medicine

## 2011-12-10 VITALS — BP 140/82 | HR 72 | Temp 98.3°F | Ht 58.5 in | Wt 121.0 lb

## 2011-12-10 DIAGNOSIS — E7800 Pure hypercholesterolemia, unspecified: Secondary | ICD-10-CM

## 2011-12-10 DIAGNOSIS — F329 Major depressive disorder, single episode, unspecified: Secondary | ICD-10-CM

## 2011-12-10 DIAGNOSIS — R5381 Other malaise: Secondary | ICD-10-CM

## 2011-12-10 DIAGNOSIS — R5383 Other fatigue: Secondary | ICD-10-CM

## 2011-12-10 DIAGNOSIS — J309 Allergic rhinitis, unspecified: Secondary | ICD-10-CM

## 2011-12-10 DIAGNOSIS — Z0000 Encounter for general adult medical examination without abnormal findings: Secondary | ICD-10-CM

## 2011-12-10 DIAGNOSIS — I10 Essential (primary) hypertension: Secondary | ICD-10-CM

## 2011-12-10 DIAGNOSIS — F3289 Other specified depressive episodes: Secondary | ICD-10-CM

## 2011-12-10 LAB — CBC WITH DIFFERENTIAL/PLATELET
Basophils Absolute: 0 10*3/uL (ref 0.0–0.1)
Basophils Relative: 1 % (ref 0.0–3.0)
HCT: 36.9 % (ref 36.0–46.0)
Hemoglobin: 12.3 g/dL (ref 12.0–15.0)
MCHC: 33.2 g/dL (ref 30.0–36.0)
MCV: 93.4 fl (ref 78.0–100.0)
Neutro Abs: 3.5 10*3/uL (ref 1.4–7.7)
RBC: 3.95 Mil/uL (ref 3.87–5.11)

## 2011-12-10 MED ORDER — HYDROCORTISONE ACETATE 25 MG RE SUPP: 25.0000 mg | RECTAL | Status: DC | PRN

## 2011-12-10 MED ORDER — FLUTICASONE PROPIONATE 50 MCG/ACT NA SUSP: 2.0000 | Freq: Every day | NASAL | Status: DC

## 2011-12-10 NOTE — Progress Notes (Signed)
56 yo very pleasant female here for CPX .  Allergic rhinitis- deteriorated.  Increased runny nose, sinus pressure past several months.  Takes Allegra but it makes her sleepy so she often doesn't take anything.  HTN- on Metoprolol 12.5 mg daily now ( we have been slowing decreasing the dose).  Lab Results  Component Value Date   CREATININE 0.9 12/03/2011    Well woman- goes to OBGYN for annual pap (s/p hysterectomy?), bone density and mammogram- next appointment in January. UTD on all prevention.  Does  yearly stool cards with Dr. Jennette Kettle. Lab Results  Component Value Date   CHOL 239* 12/03/2011   HDL 63.20 12/03/2011   LDLDIRECT 140.0 12/03/2011   TRIG 118.0 12/03/2011   CHOLHDL 4 12/03/2011   She has been more fatigued lately.  Denies any CP or SOB.  Would like her thyroid checked today.  Patient Active Problem List  Diagnosis  . PURE HYPERCHOLESTEROLEMIA  . DEPRESSION, MILD  . HYPERTENSION, BENIGN ESSENTIAL  . OTHER ACUTE SINUSITIS  . ALLERGIC RHINITIS CAUSE UNSPECIFIED  . MICROSCOPIC HEMATURIA  . INCONTINENCE, URGE  . ABDOMINAL PAIN OTHER SPECIFIED SITE  . Routine general medical examination at a health care facility  . Plant allergic contact dermatitis   No past medical history on file. Past Surgical History  Procedure Date  . Abdominal hysterectomy 1995    partial, fibroids, Dr. Jennette Kettle  . Urethral dilation 1988    Dr. Elige Radon   History  Substance Use Topics  . Smoking status: Never Smoker   . Smokeless tobacco: Not on file  . Alcohol Use: Not on file   Family History  Problem Relation Age of Onset  . Hypertension Mother   . Hyperlipidemia Mother   . Alcohol abuse Father   . Cirrhosis Father   . Hypertension Maternal Aunt   . Cancer Paternal Aunt     lung  . Hypertension Sister    No Known Allergies Current Outpatient Prescriptions on File Prior to Visit  Medication Sig Dispense Refill  . Calcium 1200-1000 MG-UNIT CHEW Chew 1 capsule by mouth daily.         . cholecalciferol (VITAMIN D) 1000 UNITS tablet Take 1,000 Units by mouth daily.        Marland Kitchen estradiol (ESTRACE) 1 MG tablet Take 1 mg by mouth daily.        . hydrocortisone (ANUSOL-HC) 25 MG suppository Place 25 mg rectally as needed.        . metoprolol succinate (TOPROL-XL) 25 MG 24 hr tablet TAKE ONE (1) TABLET EACH DAY  30 tablet  2  . Multiple Vitamin (MULTIVITAMIN) tablet Take 1 tablet by mouth daily.        . predniSONE (DELTASONE) 20 MG tablet 3 tabs by mouth daily x 3 days, 2 tabs by mouth daily x 2 days, 1 tab by mouth daily x 2 days, 1/2 tab by mouth x 2 days. Dispense qus  1 tablet  0   The PMH, PSH, Social History, Family History, Medications, and allergies have been reviewed in [facility], and have been updated if relevant.   Review of Systems       See HPI Patient reports no  vision/ hearing changes,anorexia, weight change, fever ,adenopathy, persistant / recurrent hoarseness, swallowing issues, chest pain, edema,persistant / recurrent cough, hemoptysis, dyspnea(rest, exertional, paroxysmal nocturnal), gastrointestinal  bleeding (melena, rectal bleeding), abdominal pain, excessive heart burn, GU symptoms(dysuria, hematuria, pyuria, voiding/incontinence  Issues) syncope, focal weakness, severe memory loss, concerning skin lesions, depression,  anxiety, abnormal bruising/bleeding, major joint swelling, breast masses or abnormal vaginal bleeding.     Physical Exam BP 140/82  Pulse 72  Temp 98.3 F (36.8 C)  Ht 4' 10.5" (1.486 m)  Wt 121 lb (54.885 kg)  BMI 24.86 kg/m2  General:  Well-developed,well-nourished,in no acute distress; alert,appropriate and cooperative throughout examination Head:  Normocephalic and atraumatic without obvious abnormalities. No apparent alopecia or balding. Eyes:  Conjunctiva clear bilaterally.  Ears:  Mod clear fluid bilat. Nose:  nasal dischargemucosal pallor.   Mouth:  Oral mucosa and oropharynx without lesions or exudates.  Teeth in good  repair. Lungs:  Normal respiratory effort, chest expands symmetrically. Lungs are clear to auscultation, no crackles or wheezes. Heart:  Normal rate and regular rhythm. S1 and S2 normal without gallop, murmur, click, rub or other extra sounds. Abdomen:  Bowel sounds positive,abdomen soft and non-tender without masses, organomegaly or hernias noted. Msk:  No deformity or scoliosis noted of thoracic or lumbar spine.   Extremities:  No clubbing, cyanosis, edema, or deformity noted with normal full range of motion of all joints.   Neurologic:  No cranial nerve deficits noted. Station and gait are normal. Plantar reflexes are down-going bilaterally. DTRs are symmetrical throughout. Sensory, motor and coordinative functions appear intact. Skin:  Intact without suspicious lesions or rashes Psych:  Cognition and judgment appear intact. Alert and cooperative with normal attention span and concentration. No apparent delusions, illusions, hallucinations  Assessment and Plan:  1. Routine general medical examination at a health care facility Reviewed preventive care protocols, scheduled due services, and updated immunizations Discussed nutrition, exercise, diet, and healthy lifestyle.     2. HYPERTENSION, BENIGN ESSENTIAL  Stable on current dose of toprol.   3. DEPRESSION, MILD    4. Pure hypercholesterolemia  LDL mildly elevated- discussed cutting out fast food, fried foods, saturated fats.   5. Allergic rhinitis  Deteriorated.  Rx sent for flonase.   6. Fatigue  Deteriorated.  Likely due to increased stressors.  Will check labs to rule out other possible causes. The patient indicates understanding of these issues and agrees with the plan.  TSH, T4, Free, CBC with Differential

## 2011-12-10 NOTE — Patient Instructions (Addendum)
We are checking your thyroid function and your CBC (rule out anemia) today.  We will call you with your lab results.  Try flonase- ok to use with Allegra.

## 2011-12-11 ENCOUNTER — Encounter: Payer: Self-pay | Admitting: *Deleted

## 2012-01-01 ENCOUNTER — Other Ambulatory Visit: Payer: Self-pay | Admitting: Family Medicine

## 2012-01-30 ENCOUNTER — Encounter: Payer: Self-pay | Admitting: Family Medicine

## 2012-01-30 ENCOUNTER — Ambulatory Visit (INDEPENDENT_AMBULATORY_CARE_PROVIDER_SITE_OTHER): Payer: BC Managed Care – PPO | Admitting: Family Medicine

## 2012-01-30 VITALS — BP 130/90 | HR 76 | Temp 97.9°F | Wt 124.0 lb

## 2012-01-30 DIAGNOSIS — S43429A Sprain of unspecified rotator cuff capsule, initial encounter: Secondary | ICD-10-CM

## 2012-01-30 MED ORDER — MELOXICAM 15 MG PO TABS: 15.0000 mg | ORAL_TABLET | Freq: Every day | ORAL | Status: DC

## 2012-01-30 NOTE — Patient Instructions (Addendum)
You have rotator cuff impingement Try to avoid painful activities (overhead activities, lifting with extended arm) as much as possible. Meloxicam and/or tylenol as needed for pain. Subacromial injection may be beneficial to help with pain and to decrease inflammation.   Do home exercise program as tolerated.  If not improving at follow-up we will consider further imaging and/or physical therapy.

## 2012-01-30 NOTE — Progress Notes (Signed)
SUBJECTIVE: Terri Espinoza is a 56 y.o. female who complains of right shoulder pain x 4 days.  No known injury.  Pain starts in her shoulder and goes all the way down to her right thumb.  No UE weakness.  Most difficultly lifting arm over head or behind her back.    Prior history of related problems: no prior problems with this area in the past.  Patient Active Problem List  Diagnosis  . PURE HYPERCHOLESTEROLEMIA  . DEPRESSION, MILD  . HYPERTENSION, BENIGN ESSENTIAL  . OTHER ACUTE SINUSITIS  . ALLERGIC RHINITIS CAUSE UNSPECIFIED  . MICROSCOPIC HEMATURIA  . INCONTINENCE, URGE  . ABDOMINAL PAIN OTHER SPECIFIED SITE  . Routine general medical examination at a health care facility  . Plant allergic contact dermatitis  . Allergic rhinitis   No past medical history on file. Past Surgical History  Procedure Date  . Abdominal hysterectomy 1995    partial, fibroids, Dr. Jennette Kettle  . Urethral dilation 1988    Dr. Elige Radon   History  Substance Use Topics  . Smoking status: Former Games developer  . Smokeless tobacco: Not on file  . Alcohol Use: Not on file   Family History  Problem Relation Age of Onset  . Hypertension Mother   . Hyperlipidemia Mother   . Alcohol abuse Father   . Cirrhosis Father   . Hypertension Maternal Aunt   . Cancer Paternal Aunt     lung  . Hypertension Sister    No Known Allergies Current Outpatient Prescriptions on File Prior to Visit  Medication Sig Dispense Refill  . Calcium 1200-1000 MG-UNIT CHEW Chew 1 capsule by mouth daily.        . cholecalciferol (VITAMIN D) 1000 UNITS tablet Take 1,000 Units by mouth daily.        Marland Kitchen estradiol (ESTRACE) 1 MG tablet Take 1 mg by mouth daily.        . fluticasone (FLONASE) 50 MCG/ACT nasal spray Place 2 sprays into the nose daily.  16 g  6  . hydrocortisone (ANUSOL-HC) 25 MG suppository Place 1 suppository (25 mg total) rectally as needed.  12 suppository  0  . metoprolol succinate (TOPROL-XL) 25 MG 24 hr tablet TAKE ONE (1)  TABLET EACH DAY  30 tablet  5  . Multiple Vitamin (MULTIVITAMIN) tablet Take 1 tablet by mouth daily.         The PMH, PSH, Social History, Family History, Medications, and allergies have been reviewed in [facility], and have been updated if relevant.  OBJECTIVE: BP 130/90  Pulse 76  Temp 97.9 F (36.6 C)  Wt 124 lb (56.246 kg)  Vital signs as noted above. Appearance: alert, well appearing, and in no distress. Shoulder exam: positive impingement signs. Pos arch sign, pos empty can   ASSESSMENT: Shoulder/rotator cuff impingement.  PLAN: rest the injured area as much as practical See orders for this visit as documented in the electronic medical record.

## 2012-02-29 ENCOUNTER — Other Ambulatory Visit: Payer: Self-pay | Admitting: Obstetrics & Gynecology

## 2012-02-29 DIAGNOSIS — R928 Other abnormal and inconclusive findings on diagnostic imaging of breast: Secondary | ICD-10-CM

## 2012-03-10 ENCOUNTER — Ambulatory Visit
Admission: RE | Admit: 2012-03-10 | Discharge: 2012-03-10 | Disposition: A | Discharge status: Home / Self Care | Payer: BC Managed Care – PPO | Source: Ambulatory Visit | Attending: Obstetrics & Gynecology | Admitting: Obstetrics & Gynecology

## 2012-03-10 DIAGNOSIS — R928 Other abnormal and inconclusive findings on diagnostic imaging of breast: Secondary | ICD-10-CM

## 2012-04-21 ENCOUNTER — Telehealth: Payer: Self-pay

## 2012-04-21 NOTE — Telephone Encounter (Signed)
Advised patient, appt scheduled. 

## 2012-04-21 NOTE — Telephone Encounter (Signed)
Pt has had h/a and dizziness on and off but thinks allergy related. On 04/08/12 7 Am BP taken at work 139/97. 04/08/12 PM 122/88;  04/09/12 AM 118/82;  04/10/12 AM 129/89;  04/11/12 AM 123/91;   04/14/12 PM 113/83 and 04/21/12 AM 128/84. Pt presently taking metoprolol 25 mg daily. Pt used to take Metoprolol 50 mg; pt wants to know if needs to increase Metoprolol back to 50 mg or stay at 25 mg daily or does pt need appt. Pt request call back.Medical [employer].

## 2012-04-21 NOTE — Telephone Encounter (Signed)
I would not increase her metoprolol with those readings.  Please have her come in to see me.

## 2012-04-24 ENCOUNTER — Encounter: Payer: Self-pay | Admitting: Family Medicine

## 2012-04-24 ENCOUNTER — Ambulatory Visit (INDEPENDENT_AMBULATORY_CARE_PROVIDER_SITE_OTHER): Payer: BC Managed Care – PPO | Admitting: Family Medicine

## 2012-04-24 VITALS — BP 120/84 | HR 67 | Temp 98.0°F | Ht 59.0 in | Wt 120.0 lb

## 2012-04-24 DIAGNOSIS — J309 Allergic rhinitis, unspecified: Secondary | ICD-10-CM

## 2012-04-24 DIAGNOSIS — E785 Hyperlipidemia, unspecified: Secondary | ICD-10-CM | POA: Insufficient documentation

## 2012-04-24 DIAGNOSIS — E038 Other specified hypothyroidism: Secondary | ICD-10-CM | POA: Insufficient documentation

## 2012-04-24 DIAGNOSIS — I10 Essential (primary) hypertension: Secondary | ICD-10-CM

## 2012-04-24 DIAGNOSIS — E039 Hypothyroidism, unspecified: Secondary | ICD-10-CM | POA: Insufficient documentation

## 2012-04-24 NOTE — Patient Instructions (Addendum)
Good to see you. Let's recheck your thyroid function today. You are working on your diet for low cholesterol.  Please come back in 8 weeks to recheck your cholesterol.

## 2012-04-24 NOTE — Progress Notes (Signed)
57 yo very pleasant female here to follow up HTN.     HTN- on Metoprolol 25 mg daily now.  She called the office 3 days ago because she had a headache and dizziness on.   . On 04/08/12 7 Am BP taken at work 139/97. 04/08/12 PM 122/88; 04/09/12 AM 118/82; 04/10/12 AM 129/89; 04/11/12 AM 123/91; 04/14/12 PM 113/83 and 04/21/12 AM 128/84.  Dizziness has resolved.  She thinks it was her seasonal allergies.  ?Hypothyroidism- thyroid panel was normal here in 11/2011 but had lab screening for work and brings these labs in today.  TSH was 10.00. She does have increased fatigue.  Denies any other symptoms of hypothyroidism.  ?HLD-  Was told her lipids are elevated.  LDL 147.  Was 128 last year.  Admits to not eating as well.      Patient Active Problem List  Diagnosis  . PURE HYPERCHOLESTEROLEMIA  . DEPRESSION, MILD  . HYPERTENSION, BENIGN ESSENTIAL  . OTHER ACUTE SINUSITIS  . ALLERGIC RHINITIS CAUSE UNSPECIFIED  . MICROSCOPIC HEMATURIA  . INCONTINENCE, URGE  . ABDOMINAL PAIN OTHER SPECIFIED SITE  . Routine general medical examination at a health care facility  . Plant allergic contact dermatitis  . Allergic rhinitis   No past medical history on file. Past Surgical History  Procedure Laterality Date  . Abdominal hysterectomy  1995    partial, fibroids, Dr. Jennette Kettle  . Urethral dilation  1988    Dr. Elige Radon   History  Substance Use Topics  . Smoking status: Former Games developer  . Smokeless tobacco: Not on file  . Alcohol Use: Not on file   Family History  Problem Relation Age of Onset  . Hypertension Mother   . Hyperlipidemia Mother   . Alcohol abuse Father   . Cirrhosis Father   . Hypertension Maternal Aunt   . Cancer Paternal Aunt     lung  . Hypertension Sister    No Known Allergies Current Outpatient Prescriptions on File Prior to Visit  Medication Sig Dispense Refill  . Calcium 1200-1000 MG-UNIT CHEW Chew 1 capsule by mouth daily.        . cholecalciferol (VITAMIN D) 1000  UNITS tablet Take 1,000 Units by mouth daily.        Marland Kitchen estradiol (ESTRACE) 1 MG tablet Take 1 mg by mouth daily.        . fluticasone (FLONASE) 50 MCG/ACT nasal spray Place 2 sprays into the nose daily.  16 g  6  . hydrocortisone (ANUSOL-HC) 25 MG suppository Place 1 suppository (25 mg total) rectally as needed.  12 suppository  0  . meloxicam (MOBIC) 15 MG tablet Take 1 tablet (15 mg total) by mouth daily.  30 tablet  0  . metoprolol succinate (TOPROL-XL) 25 MG 24 hr tablet TAKE ONE (1) TABLET EACH DAY  30 tablet  5  . Multiple Vitamin (MULTIVITAMIN) tablet Take 1 tablet by mouth daily.         No current facility-administered medications on file prior to visit.   The PMH, PSH, Social History, Family History, Medications, and allergies have been reviewed in [facility], and have been updated if relevant.   Review of Systems       See HPI Patient reports no  vision/ hearing changes,anorexia, weight change, fever ,adenopathy, persistant / recurrent hoarseness, swallowing issues, chest pain, edema,persistant / recurrent cough, hemoptysis, dyspnea(rest, exertional, paroxysmal nocturnal), gastrointestinal  bleeding (melena, rectal bleeding), abdominal pain, excessive heart burn, GU symptoms(dysuria, hematuria, pyuria, voiding/incontinence  Issues)  syncope, focal weakness, severe memory loss, concerning skin lesions, depression, anxiety, abnormal bruising/bleeding, major joint swelling, breast masses or abnormal vaginal bleeding.     Physical Exam BP 120/84  Pulse 67  Temp(Src) 98 F (36.7 C) (Oral)  Ht 4\' 11"  (1.499 m)  Wt 120 lb (54.432 kg)  BMI 24.22 kg/m2  SpO2 98%  General:  Well-developed,well-nourished,in no acute distress; alert,appropriate and cooperative throughout examination Head:  Normocephalic and atraumatic without obvious abnormalities. No apparent alopecia or balding. Eyes:  Conjunctiva clear bilaterally.  Ears:  Mod clear fluid bilat. Nose:  nasal dischargemucosal pallor.    Mouth:  Oral mucosa and oropharynx without lesions or exudates.  Teeth in good repair. Lungs:  Normal respiratory effort, chest expands symmetrically. Lungs are clear to auscultation, no crackles or wheezes. Heart:  Normal rate and regular rhythm. S1 and S2 normal without gallop, murmur, click, rub or other extra sounds. Abdomen:  Bowel sounds positive,abdomen soft and non-tender without masses, organomegaly or hernias noted. Msk:  No deformity or scoliosis noted of thoracic or lumbar spine.   Extremities:  No clubbing, cyanosis, edema, or deformity noted with normal full range of motion of all joints.   Neurologic:  No cranial nerve deficits noted. Station and gait are normal. Plantar reflexes are down-going bilaterally. DTRs are symmetrical throughout. Sensory, motor and coordinative functions appear intact. Skin:  Intact without suspicious lesions or rashes Psych:  Cognition and judgment appear intact. Alert and cooperative with normal attention span and concentration. No apparent delusions, illusions, hallucinations  Assessment and Plan:  1. HYPERTENSION, BENIGN ESSENTIAL Normotensive today, symptoms resolved.  Continue current dose of Metoprolol.  2. Allergic rhinitis Improved.    3. Unspecified hypothyroidism Recheck thyroid panel today.  May need thyroid replacement.   - TSH - T4, Free  4. HLD (hyperlipidemia) Discussed "normal" levels and goals.  She is non diabetic, no h/o CAD and LDL of 147 is acceptable.  We did discuss low cholesterol diet, she has a hand out.  She will return in 8 weeks to recheck lipids. The patient indicates understanding of these issues and agrees with the plan.

## 2012-06-23 ENCOUNTER — Other Ambulatory Visit: Payer: Self-pay | Admitting: Family Medicine

## 2012-12-23 ENCOUNTER — Other Ambulatory Visit: Payer: Self-pay | Admitting: *Deleted

## 2012-12-23 MED ORDER — METOPROLOL SUCCINATE ER 25 MG PO TB24: ORAL_TABLET | ORAL | Status: DC

## 2012-12-30 ENCOUNTER — Ambulatory Visit (INDEPENDENT_AMBULATORY_CARE_PROVIDER_SITE_OTHER): Payer: BC Managed Care – PPO | Admitting: Internal Medicine

## 2012-12-30 ENCOUNTER — Encounter: Payer: Self-pay | Admitting: Internal Medicine

## 2012-12-30 VITALS — BP 110/78 | HR 71 | Temp 98.2°F | Ht 58.75 in | Wt 123.8 lb

## 2012-12-30 DIAGNOSIS — Z0000 Encounter for general adult medical examination without abnormal findings: Secondary | ICD-10-CM

## 2012-12-30 DIAGNOSIS — I10 Essential (primary) hypertension: Secondary | ICD-10-CM

## 2012-12-30 LAB — LIPID PANEL
Cholesterol: 235 mg/dL — ABNORMAL HIGH (ref 0–200)
HDL: 67.5 mg/dL (ref >39.00)
Triglycerides: 127 mg/dL (ref 0.0–149.0)
VLDL: 25.4 mg/dL (ref 0.0–40.0)

## 2012-12-30 LAB — COMPREHENSIVE METABOLIC PANEL
BUN: 16 mg/dL (ref 6–23)
CO2: 30 mEq/L (ref 19–32)
Creatinine, Ser: 0.9 mg/dL (ref 0.4–1.2)
GFR: 71.22 mL/min (ref >60.00)
Glucose, Bld: 85 mg/dL (ref 70–99)
Total Bilirubin: 0.6 mg/dL (ref 0.3–1.2)
Total Protein: 6.7 g/dL (ref 6.0–8.3)

## 2012-12-30 LAB — CBC
HCT: 36.3 % (ref 36.0–46.0)
Hemoglobin: 12.4 g/dL (ref 12.0–15.0)
Platelets: 179 10*3/uL (ref 150.0–400.0)
RBC: 3.99 Mil/uL (ref 3.87–5.11)
WBC: 7.3 10*3/uL (ref 4.5–10.5)

## 2012-12-30 MED ORDER — METOPROLOL SUCCINATE ER 25 MG PO TB24: ORAL_TABLET | ORAL | Status: DC

## 2012-12-30 NOTE — Progress Notes (Signed)
Pre-visit discussion using our clinic review tool. No additional management support is needed unless otherwise documented below in the visit note.  

## 2012-12-30 NOTE — Progress Notes (Signed)
Subjective:    Patient ID: Terri Espinoza, female    DOB: [birth year], 57 y.o.   MRN: [id number]  HPI  Pt presents to the clinic today for her annual exam. She has no concerns today.  Flu: never Tetanus: 2010 Pap Smear: 02/2012 Mammogram: 02/2012 Colonoscopy: 2007 (10 years) Eye Doctor: Lupita Raider Dentist: bianually  Review of Systems      No past medical history on file.  Current Outpatient Prescriptions  Medication Sig Dispense Refill  . cholecalciferol (VITAMIN D) 1000 UNITS tablet Take 1,000 Units by mouth daily.        Marland Kitchen estradiol (ESTRACE) 1 MG tablet Take 1 mg by mouth daily.        . fluticasone (FLONASE) 50 MCG/ACT nasal spray Place 2 sprays into the nose daily.  16 g  6  . metoprolol succinate (TOPROL-XL) 25 MG 24 hr tablet TAKE ONE (1) TABLET EACH DAY **NEEDS FOLLOW UP FOR FURTHER REFILLS**  30 tablet  1  . Multiple Vitamin (MULTIVITAMIN) tablet Take 1 tablet by mouth daily.         No current facility-administered medications for this visit.    No Known Allergies  Family History  Problem Relation Age of Onset  . Hypertension Mother   . Hyperlipidemia Mother   . Alcohol abuse Father   . Cirrhosis Father   . Hypertension Maternal Aunt   . Cancer Paternal Aunt     lung  . Hypertension Sister     History   Social History  . Marital Status: Divorced    Spouse Name: N/A    Number of Children: 0  . Years of Education: N/A   Occupational History  . Underwriter, Insurance    Social History Main Topics  . Smoking status: Former Games developer  . Smokeless tobacco: Not on file  . Alcohol Use: Not on file  . Drug Use: Not on file  . Sexual Activity: Not on file   Other Topics Concern  . Not on file   Social History Narrative  . No narrative on file     Constitutional: Denies fever, malaise, fatigue, headache or abrupt weight changes.  HEENT: Denies eye pain, eye redness, ear pain, ringing in the ears, wax buildup, runny nose, nasal congestion, bloody  nose, or sore throat. Respiratory: Denies difficulty breathing, shortness of breath, cough or sputum production.   Cardiovascular: Denies chest pain, chest tightness, palpitations or swelling in the hands or feet.  Gastrointestinal: Denies abdominal pain, bloating, constipation, diarrhea or blood in the stool.  GU: Denies urgency, frequency, pain with urination, burning sensation, blood in urine, odor or discharge. Musculoskeletal: Denies decrease in range of motion, difficulty with gait, muscle pain or joint pain and swelling.  Skin: Denies redness, rashes, lesions or ulcercations.  Neurological: Denies dizziness, difficulty with memory, difficulty with speech or problems with balance and coordination.   No other specific complaints in a complete review of systems (except as listed in HPI above).  Objective:   Physical Exam  BP 110/78  Pulse 71  Temp(Src) 98.2 F (36.8 C) (Oral)  Ht 4' 10.75" (1.492 m)  Wt 123 lb 12 oz (56.133 kg)  BMI 25.22 kg/m2  SpO2 98% Wt Readings from Last 3 Encounters:  12/30/12 123 lb 12 oz (56.133 kg)  04/24/12 120 lb (54.432 kg)  01/30/12 124 lb (56.246 kg)    General: Appears her stated age, well developed, well nourished in NAD. Skin: Warm, dry and intact. No rashes, lesions or ulcerations noted. HEENT:  Head: normal shape and size; Eyes: sclera white, no icterus, conjunctiva pink, PERRLA and EOMs intact; Ears: Tm's gray and intact, normal light reflex; Nose: mucosa pink and moist, septum midline; Throat/Mouth: Teeth present, mucosa pink and moist, no exudate, lesions or ulcerations noted.  Neck: Normal range of motion. Neck supple, trachea midline. No massses, lumps or thyromegaly present.  Cardiovascular: Normal rate and rhythm. S1,S2 noted.  No murmur, rubs or gallops noted. No JVD or BLE edema. No carotid bruits noted. Pulmonary/Chest: Normal effort and positive vesicular breath sounds. No respiratory distress. No wheezes, rales or ronchi noted.   Abdomen: Soft and nontender. Normal bowel sounds, no bruits noted. No distention or masses noted. Liver, spleen and kidneys non palpable. Musculoskeletal: Normal range of motion. No signs of joint swelling. No difficulty with gait.  Neurological: Alert and oriented. Cranial nerves II-XII intact. Coordination normal. +DTRs bilaterally. Psychiatric: Mood and affect normal. Behavior is normal. Judgment and thought content normal.     BMET    Component Value Date/Time   NA 139 12/03/2011 0854   K 4.3 12/03/2011 0854   CL 105 12/03/2011 0854   CO2 29 12/03/2011 0854   GLUCOSE 80 12/03/2011 0854   BUN 17 12/03/2011 0854   CREATININE 0.9 12/03/2011 0854   CALCIUM 8.8 12/03/2011 0854   GFRNONAA 67.61 08/01/2009 0846   GFRAA 85 06/05/2007 0955    Lipid Panel     Component Value Date/Time   CHOL 239* 12/03/2011 0854   TRIG 118.0 12/03/2011 0854   HDL 63.20 12/03/2011 0854   CHOLHDL 4 12/03/2011 0854   VLDL 23.6 12/03/2011 0854    CBC    Component Value Date/Time   WBC 5.0 12/10/2011 0948   RBC 3.95 12/10/2011 0948   HGB 12.3 12/10/2011 0948   HCT 36.9 12/10/2011 0948   PLT 168.0 12/10/2011 0948   MCV 93.4 12/10/2011 0948   MCHC 33.2 12/10/2011 0948   RDW 12.8 12/10/2011 0948   LYMPHSABS 0.9 12/10/2011 0948   MONOABS 0.5 12/10/2011 0948   EOSABS 0.1 12/10/2011 0948   BASOSABS 0.0 12/10/2011 0948    Hgb A1C No results found for this basename: HGBA1C         Assessment & Plan:   Preventative health Maintenance:  Pt declines flu shot today Will obtain labs today and call you with the results Refilled Metoprolol per pt request  RTC in1 year or sooner if needed

## 2012-12-30 NOTE — Patient Instructions (Signed)

## 2013-03-02 ENCOUNTER — Telehealth: Payer: Self-pay

## 2013-03-02 NOTE — Telephone Encounter (Signed)
Pt left v/m since Christmas pt's BP has been elevated; highest has been 155/102. Today BP is 135/90. Pt has been taking Metoprolol 25 mg taking one twice a day and pt wants to know if that is OK. On med list pt should be taking Metoprolol 25 mg one tab daily. Unable to reach pt by phone for more info.Please advise.

## 2013-03-02 NOTE — Telephone Encounter (Signed)
Spoke to pt and advised per Dr Aron;f/u appt sched for 0815 on 03/05/13

## 2013-03-02 NOTE — Telephone Encounter (Signed)
Yes ok to take two for now but needs to be seen this week.

## 2013-03-05 ENCOUNTER — Encounter: Payer: Self-pay | Admitting: Family Medicine

## 2013-03-05 ENCOUNTER — Ambulatory Visit (INDEPENDENT_AMBULATORY_CARE_PROVIDER_SITE_OTHER): Payer: BC Managed Care – PPO | Admitting: Family Medicine

## 2013-03-05 VITALS — BP 126/76 | HR 72 | Temp 97.7°F | Ht 59.0 in | Wt 122.0 lb

## 2013-03-05 DIAGNOSIS — I10 Essential (primary) hypertension: Secondary | ICD-10-CM

## 2013-03-05 DIAGNOSIS — F411 Generalized anxiety disorder: Secondary | ICD-10-CM | POA: Insufficient documentation

## 2013-03-05 MED ORDER — METOPROLOL SUCCINATE ER 50 MG PO TB24: ORAL_TABLET | ORAL | Status: DC

## 2013-03-05 MED ORDER — SERTRALINE HCL 25 MG PO TABS: 25.0000 mg | ORAL_TABLET | Freq: Every day | ORAL | Status: DC

## 2013-03-05 NOTE — Assessment & Plan Note (Signed)
Improved today on two 25 mg tablets. Will increase dose to Toprol XL 5 mg daily. She will update me with her blood pressures. Call or return to clinic prn if these symptoms worsen or fail to improve as anticipated. The patient indicates understanding of these issues and agrees with the plan.

## 2013-03-05 NOTE — Patient Instructions (Signed)
Good to see you. Let's start Toprol ER 50 mg daily.  We are also starting  Zoloft 25 mg at bedtime. Call me or message me in 2-3 weeks, sooner if you have issues.

## 2013-03-05 NOTE — Assessment & Plan Note (Signed)
Discussed tx options. She is interested in started low dose SSRI- zoloft 25 mg qhs.  She will update me in a few weeks.

## 2013-03-05 NOTE — Progress Notes (Signed)
58 yo very pleasant female here to follow up HTN.     HTN- on Metoprolol 25 mg XR daily now.  BP has been elevated since Christmas.  Admits to eating more salt and also being more anxious.  She has been taking two tablets daily.  Denies any HA, blurred vision, CP or LE edema.    Anxiety- feels like she has been more on edge lately- things that didn't use to bother her, bother her now.  Denies feeling depressed.  Sleeping ok.  Appetite good, weight stable. Wt Readings from Last 3 Encounters:  03/05/13 122 lb (55.339 kg)  12/30/12 123 lb 12 oz (56.133 kg)  04/24/12 120 lb (54.432 kg)   Denies SI or HI.    Patient Active Problem List   Diagnosis Date Noted  . Generalized anxiety disorder 03/05/2013  . Unspecified hypothyroidism 04/24/2012  . HLD (hyperlipidemia) 04/24/2012  . INCONTINENCE, URGE 12/19/2009  . ALLERGIC RHINITIS CAUSE UNSPECIFIED 08/08/2009  . DEPRESSION, MILD 01/29/2007  . HYPERTENSION, BENIGN ESSENTIAL 01/29/2007   No past medical history on file. Past Surgical History  Procedure Laterality Date  . Abdominal hysterectomy  1995    partial, fibroids, Dr. Nori Riis  . Urethral dilation  1988    Dr. Leory Plowman   History  Substance Use Topics  . Smoking status: Former Research scientist (life sciences)  . Smokeless tobacco: Not on file  . Alcohol Use: Not on file   Family History  Problem Relation Age of Onset  . Hypertension Mother   . Hyperlipidemia Mother   . Alcohol abuse Father   . Cirrhosis Father   . Hypertension Maternal Aunt   . Cancer Paternal Aunt     lung  . Hypertension Sister    No Known Allergies Current Outpatient Prescriptions on File Prior to Visit  Medication Sig Dispense Refill  . cholecalciferol (VITAMIN D) 1000 UNITS tablet Take 1,000 Units by mouth daily.        Marland Kitchen estradiol (ESTRACE) 1 MG tablet Take 1 mg by mouth daily.        . fluticasone (FLONASE) 50 MCG/ACT nasal spray Place 2 sprays into the nose daily.  16 g  6  . Multiple Vitamin (MULTIVITAMIN) tablet Take  1 tablet by mouth daily.         No current facility-administered medications on file prior to visit.   The PMH, PSH, Social History, Family History, Medications, and allergies have been reviewed in [facility], and have been updated if relevant.   Review of Systems       See HPI   Physical Exam BP 126/76  Pulse 72  Temp(Src) 97.7 F (36.5 C) (Oral)  Ht 4\' 11"  (1.499 m)  Wt 122 lb (55.339 kg)  BMI 24.63 kg/m2  SpO2 97%  General:  Well-developed,well-nourished,in no acute distress; alert,appropriate and cooperative throughout examination Head:  Normocephalic and atraumatic without obvious abnormalities. No apparent alopecia or balding. Lungs:  Normal respiratory effort, chest expands symmetrically. Lungs are clear to auscultation, no crackles or wheezes. Heart:  Normal rate and regular rhythm. S1 and S2 normal without gallop, murmur, click, rub or other extra sounds. Abdomen:  Bowel sounds positive,abdomen soft and non-tender without masses, organomegaly or hernias noted. Msk:  No deformity or scoliosis noted of thoracic or lumbar spine.   Extremities:  No clubbing, cyanosis, edema, or deformity noted with normal full range of motion of all joints.   Neurologic:  No cranial nerve deficits noted. Station and gait are normal. Plantar reflexes are down-going bilaterally. DTRs  are symmetrical throughout. Sensory, motor and coordinative functions appear intact. Skin:  Intact without suspicious lesions or rashes Psych:  Cognition and judgment appear intact.  Moderately anxious  Assessment and Plan:

## 2013-03-05 NOTE — Progress Notes (Signed)
Pre-visit discussion using our clinic review tool. No additional management support is needed unless otherwise documented below in the visit note.  

## 2013-03-06 ENCOUNTER — Telehealth: Payer: Self-pay | Admitting: Family Medicine

## 2013-03-06 NOTE — Telephone Encounter (Signed)
Relevant patient education assigned to patient using Emmi. ° °

## 2013-07-08 ENCOUNTER — Telehealth: Payer: Self-pay | Admitting: Gastroenterology

## 2013-07-09 NOTE — Telephone Encounter (Signed)
Dr Ardis Hughs this pt was seen by you in 2009, should I schedule her to see you or refer to Dr [name]?

## 2013-07-09 NOTE — Telephone Encounter (Signed)
Terri Espinoza, How would you prefer that this be handled?  I can see her in office and refer her to you if appropriate.  Or she could be scheduled to see you directly in the office.

## 2013-07-09 NOTE — Telephone Encounter (Signed)
I am fine with me seeing her directly if you are and she can return to you for procedures, etc

## 2013-07-09 NOTE — Telephone Encounter (Signed)
That sounds great.  Thanks.  Patty, can you help set up NGI appt with Dr. [name] for hemorrhoids.  thanks

## 2013-07-09 NOTE — Telephone Encounter (Signed)
The pt has been scheduled to see Dr [name].  She is aware

## 2013-08-27 ENCOUNTER — Encounter: Payer: Self-pay | Admitting: Gastroenterology

## 2013-09-07 ENCOUNTER — Ambulatory Visit (INDEPENDENT_AMBULATORY_CARE_PROVIDER_SITE_OTHER): Payer: BC Managed Care – PPO | Admitting: Internal Medicine

## 2013-09-07 ENCOUNTER — Encounter: Payer: Self-pay | Admitting: Internal Medicine

## 2013-09-07 VITALS — BP 120/70 | HR 72 | Ht 59.0 in | Wt 121.4 lb

## 2013-09-07 DIAGNOSIS — K648 Other hemorrhoids: Secondary | ICD-10-CM

## 2013-09-07 DIAGNOSIS — K641 Second degree hemorrhoids: Secondary | ICD-10-CM | POA: Insufficient documentation

## 2013-09-07 DIAGNOSIS — K602 Anal fissure, unspecified: Secondary | ICD-10-CM | POA: Insufficient documentation

## 2013-09-07 MED ORDER — AMBULATORY NON FORMULARY MEDICATION: Status: DC

## 2013-09-07 NOTE — Patient Instructions (Addendum)
Today you have been provided a handout on benefiber to read and follow.  Start off with one tablespoon daily and work up to two tablespoons daily.  Today you have been given a handout to read on anal fissure.  We faxed a rx to [facility] in [place] for you to pick up of the nitroglycerin oint.  Follow up with [place] in 2 months, appt made for 11/16/2013 at 3pm.  I appreciate the opportunity to care for you.

## 2013-09-07 NOTE — Assessment & Plan Note (Signed)
sxs ant and post but post on exam - anoscopy Will treat with Benefiber and 0.125% NTG ointment qid

## 2013-09-07 NOTE — Progress Notes (Signed)
   Subjective:    Patient ID: Ames Coupe, female    DOB: 08-25-1955, 58 y.o.   MRN: [id number]  HPI  Is a very nice lady known to [place] from previous colonoscopy with small external hemorrhoids, back in 2008. Dr. Ardis Hughs to this procedure. She had called the office testing be seen and hemorrhoids. She is describing relatively persistent and frequent anal discomfort with defecation. It's been ongoing for several weeks to months. It is a chronic recurrent issue. She strains to stool some. She had to be constipated. There is a small amount of rectal bleeding and some anal itching is well. Her biggest complaint is pain. She does not describe protruding hemorrhoids. She has used over-the-counter and prescription suppositories to treat hemorrhoids with some benefit.  Review of Systems Positive for urinary frequency allergies osteoarthritis.    Objective:   Physical Exam WDWN NAD  Rectal:  Patti [place], [place] present.  Anoderm inspection revealed small anterior Anal wink was absent Digital exam revealed mild-moderate tenderness and increased resting tone  No mass or rectocele present.   Anoscopy was performed with the patient in the left lateral decubitus position while a chaperone was present and revealed grade 1 internal hemorrhoids all positions but RP and LL most prominent, and suspected posterior fissure        Assessment & Plan:  Anal fissure sxs ant and post but post on exam - anoscopy Will treat with Benefiber and 0.125% NTG ointment qid     Internal hemorrhoids with itching, bleeding Await tx of fissure - may need banding benefiber   I appreciate the opportunity to care for this patient. CC: Arnette Norris, MD

## 2013-09-07 NOTE — Assessment & Plan Note (Signed)
Await tx of fissure - may need banding benefiber

## 2013-09-09 ENCOUNTER — Encounter: Payer: Self-pay | Admitting: Family Medicine

## 2013-09-10 NOTE — Telephone Encounter (Signed)
Pt responded to via mychart and advised to contact office to schedule appt

## 2013-09-11 ENCOUNTER — Encounter: Payer: Self-pay | Admitting: Family Medicine

## 2013-09-11 ENCOUNTER — Ambulatory Visit (INDEPENDENT_AMBULATORY_CARE_PROVIDER_SITE_OTHER): Payer: BC Managed Care – PPO | Admitting: Family Medicine

## 2013-09-11 VITALS — BP 130/78 | HR 60 | Temp 98.1°F | Ht 59.0 in | Wt 122.8 lb

## 2013-09-11 DIAGNOSIS — L255 Unspecified contact dermatitis due to plants, except food: Secondary | ICD-10-CM

## 2013-09-11 DIAGNOSIS — L237 Allergic contact dermatitis due to plants, except food: Secondary | ICD-10-CM | POA: Insufficient documentation

## 2013-09-11 MED ORDER — MOMETASONE FUROATE 0.1 % EX CREA: 1.0000 "application " | TOPICAL_CREAM | Freq: Every day | CUTANEOUS | Status: DC

## 2013-09-11 MED ORDER — PREDNISONE 10 MG PO TABS: ORAL_TABLET | ORAL | Status: DC

## 2013-09-11 NOTE — Patient Instructions (Signed)
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

## 2013-09-11 NOTE — Assessment & Plan Note (Signed)
Will wash clothing/ shoes that contacted the plant pred taper 30 mg -disc side eff Antihistamine -allegra Keep cool Elocon cream topically prn to aff areas  Update if not starting to improve in a week or if worsening

## 2013-09-11 NOTE — Progress Notes (Signed)
Pre visit review using our clinic review tool, if applicable. No additional management support is needed unless otherwise documented below in the visit note. 

## 2013-09-11 NOTE — Progress Notes (Signed)
Subjective:    Patient ID: Terri Espinoza, female    DOB: [birth year], 58 y.o.   MRN: [id number]  HPI She has poison oak/ poison ivy dermatitis   She was cutting back some bushes  Started Monday- face and chest and neck  Now legs and arms  Used px cream-but it was old   Sometimes needs prednisone   She did take benadryl once  Durward Fortes daily   Patient Active Problem List   Diagnosis Date Noted  . Anal fissure 09/07/2013  . Internal hemorrhoids with itching, bleeding 09/07/2013  . Generalized anxiety disorder 03/05/2013  . Unspecified hypothyroidism 04/24/2012  . HLD (hyperlipidemia) 04/24/2012  . INCONTINENCE, URGE 12/19/2009  . ALLERGIC RHINITIS CAUSE UNSPECIFIED 08/08/2009  . DEPRESSION, MILD 01/29/2007  . HYPERTENSION, BENIGN ESSENTIAL 01/29/2007   Past Medical History  Diagnosis Date  . Hemorrhoids   . HTN (hypertension)   . Anxiety   . Hypothyroidism   . HLD (hyperlipidemia)   . Allergic rhinitis   . Depression   . Uterine fibroid    Past Surgical History  Procedure Laterality Date  . Abdominal hysterectomy  1995    partial, fibroids, Dr. Nori Riis  . Urethral dilation  1988    Dr. Leory Plowman  . Flexible sigmoidoscopy  2002  . Colonoscopy  2009   History  Substance Use Topics  . Smoking status: Former Research scientist (life sciences)  . Smokeless tobacco: Never Used  . Alcohol Use: No   Family History  Problem Relation Age of Onset  . Hypertension Mother   . Hyperlipidemia Mother   . Alcohol abuse Father   . Cirrhosis Father   . Hypertension Maternal Aunt   . Lung cancer Paternal Aunt   . Hypertension Sister    No Known Allergies Current Outpatient Prescriptions on File Prior to Visit  Medication Sig Dispense Refill  . AMBULATORY NON FORMULARY MEDICATION Medication Name: Nitroglycerine ointment 0.125 %  Apply a pea sized amount internally four times daily. Dispense 30 GM zero refill  30 g  2  . cholecalciferol (VITAMIN D) 1000 UNITS tablet Take 1,000 Units by mouth  daily.        Marland Kitchen estradiol (ESTRACE) 1 MG tablet Take 1 mg by mouth daily.        . metoprolol succinate (TOPROL-XL) 50 MG 24 hr tablet 1 tab by mouth daily.  30 tablet  11  . Multiple Vitamin (MULTIVITAMIN) tablet Take 1 tablet by mouth daily.         No current facility-administered medications on file prior to visit.      Review of Systems Review of Systems  Constitutional: Negative for fever, appetite change, fatigue and unexpected weight change.  Eyes: Negative for pain and visual disturbance.  Respiratory: Negative for cough and shortness of breath.   Cardiovascular: Negative for cp or palpitations    Gastrointestinal: Negative for nausea, diarrhea and constipation.  Genitourinary: Negative for urgency and frequency.  Skin: Negative for pallor and pos for rash /itching  Neurological: Negative for weakness, light-headedness, numbness and headaches.  Hematological: Negative for adenopathy. Does not bruise/bleed easily.  Psychiatric/Behavioral: Negative for dysphoric mood. The patient is not nervous/anxious.         Objective:   Physical Exam  Constitutional: She appears well-developed and well-nourished. No distress.  HENT:  Head: Normocephalic and atraumatic.  Mouth/Throat: Oropharynx is clear and moist.  Eyes: Conjunctivae and EOM are normal. Pupils are equal, round, and reactive to light. Right eye exhibits no discharge. Left eye  exhibits no discharge. No scleral icterus.  Neck: Normal range of motion. Neck supple.  Cardiovascular: Normal rate and regular rhythm.   Pulmonary/Chest: Effort normal and breath sounds normal. No respiratory distress. She has no wheezes.  Musculoskeletal: She exhibits no edema.  Lymphadenopathy:    She has no cervical adenopathy.  Neurological: She is alert.  Skin: Skin is warm and dry. Rash noted. There is erythema.  Vesicular rash with erythema in patches on chest and neck -with a few vesicles on arms and legs  Spares back   Psychiatric:  She has a normal mood and affect.          Assessment & Plan:   Problem List Items Addressed This Visit     Musculoskeletal and Integument   Plant allergic contact dermatitis - Primary     Will wash clothing/ shoes that contacted the plant pred taper 30 mg -disc side eff Antihistamine -allegra Keep cool Elocon cream topically prn to aff areas  Update if not starting to improve in a week or if worsening

## 2013-10-15 ENCOUNTER — Encounter: Payer: Self-pay | Admitting: Gastroenterology

## 2013-11-16 ENCOUNTER — Encounter: Payer: Self-pay | Admitting: Internal Medicine

## 2013-11-16 ENCOUNTER — Ambulatory Visit (INDEPENDENT_AMBULATORY_CARE_PROVIDER_SITE_OTHER): Payer: BC Managed Care – PPO | Admitting: Internal Medicine

## 2013-11-16 VITALS — BP 110/70 | HR 68 | Ht 59.0 in | Wt 122.8 lb

## 2013-11-16 DIAGNOSIS — K648 Other hemorrhoids: Secondary | ICD-10-CM

## 2013-11-16 DIAGNOSIS — K602 Anal fissure, unspecified: Secondary | ICD-10-CM

## 2013-11-16 MED ORDER — DILTIAZEM GEL 2 %: 1.0000 "application " | Freq: Two times a day (BID) | CUTANEOUS | Status: DC

## 2013-11-16 NOTE — Progress Notes (Signed)
   Subjective:    Patient ID: Terri Espinoza, female    DOB: Jun 10, 1955, 58 y.o.   MRN: [id number]  HPI The patient is here for f/u of anal fissure and hemorrhoids. She has noticed improvement with NTG ointment. Still with some occasional pain with defecation. Benefiber caused some indigestion.  Medications, allergies, past medical history, past surgical history, family history and social history are reviewed and updated in the EMR.  Review of Systems As above    Objective:   Physical Exam Rectal inspection with female staff present shows small anterior tag     Assessment & Plan:  Anal fissure Improving Tx w/ diltiazem gel instead X 2 + months Change to stool softener F/U prn  Internal hemorrhoids with itching, bleeding Improving Continue to Tx fissure   Cc:Arnette Norris, MD

## 2013-11-16 NOTE — Assessment & Plan Note (Signed)
Improving Continue to Tx fissure

## 2013-11-16 NOTE — Assessment & Plan Note (Addendum)
Improving Tx w/ diltiazem gel instead X 2 + months Change to stool softener F/U prn

## 2013-11-16 NOTE — Patient Instructions (Signed)
Follow up with [place] as needed, however if your symptoms don't get better in 2-3 months come back and see [place].  Today we are giving you a rx for diltiazem gel to fill and use when you get done with your nitroglycerin rx.  Please take colace instead of benefiber.   I appreciate the opportunity to care for you.

## 2013-11-27 ENCOUNTER — Other Ambulatory Visit: Payer: Self-pay

## 2014-03-01 ENCOUNTER — Telehealth: Payer: Self-pay

## 2014-03-01 MED ORDER — METOPROLOL SUCCINATE ER 50 MG PO TB24: ORAL_TABLET | ORAL | Status: DC

## 2014-03-01 NOTE — Telephone Encounter (Signed)
Ok to refill one month supply and I will see her within a month.  No need to use same day slot since they are already limited.

## 2014-03-01 NOTE — Telephone Encounter (Signed)
Notified [facility] and pt scheduled appt on 03/10/14 as instructed.

## 2014-03-01 NOTE — Telephone Encounter (Signed)
Pt left v/m requesting appt to see Dr Deborra Medina to get refill on Metoprol; pt has 2 pills left. Can a same day appt be used on 03/02/14 or 03/03/14 to schedule f/u for BP?  Pt last seen 03/05/13.

## 2014-03-10 ENCOUNTER — Encounter: Payer: Self-pay | Admitting: Family Medicine

## 2014-03-10 ENCOUNTER — Ambulatory Visit (INDEPENDENT_AMBULATORY_CARE_PROVIDER_SITE_OTHER): Payer: BLUE CROSS/BLUE SHIELD | Admitting: Family Medicine

## 2014-03-10 VITALS — BP 116/68 | HR 63 | Temp 98.2°F | Wt 124.5 lb

## 2014-03-10 DIAGNOSIS — J309 Allergic rhinitis, unspecified: Secondary | ICD-10-CM

## 2014-03-10 DIAGNOSIS — E785 Hyperlipidemia, unspecified: Secondary | ICD-10-CM

## 2014-03-10 DIAGNOSIS — I10 Essential (primary) hypertension: Secondary | ICD-10-CM

## 2014-03-10 LAB — CBC WITH DIFFERENTIAL/PLATELET
BASOS PCT: 0.6 % (ref 0.0–3.0)
Basophils Absolute: 0 10*3/uL (ref 0.0–0.1)
EOS ABS: 0.1 10*3/uL (ref 0.0–0.7)
Eosinophils Relative: 1.1 % (ref 0.0–5.0)
HCT: 37.8 % (ref 36.0–46.0)
HEMOGLOBIN: 13 g/dL (ref 12.0–15.0)
LYMPHS ABS: 1.2 10*3/uL (ref 0.7–4.0)
Lymphocytes Relative: 17.9 % (ref 12.0–46.0)
MCHC: 34.3 g/dL (ref 30.0–36.0)
MCV: 90.3 fl (ref 78.0–100.0)
Monocytes Absolute: 0.5 10*3/uL (ref 0.1–1.0)
Monocytes Relative: 7.3 % (ref 3.0–12.0)
Neutro Abs: 5 10*3/uL (ref 1.4–7.7)
Neutrophils Relative %: 73.1 % (ref 43.0–77.0)
PLATELETS: 190 10*3/uL (ref 150.0–400.0)
RBC: 4.19 Mil/uL (ref 3.87–5.11)
RDW: 12.8 % (ref 11.5–15.5)
WBC: 6.8 10*3/uL (ref 4.0–10.5)

## 2014-03-10 LAB — COMPREHENSIVE METABOLIC PANEL
ALBUMIN: 3.8 g/dL (ref 3.5–5.2)
ALT: 12 U/L (ref 0–35)
AST: 17 U/L (ref 0–37)
Alkaline Phosphatase: 50 U/L (ref 39–117)
BUN: 17 mg/dL (ref 6–23)
CO2: 27 meq/L (ref 19–32)
Calcium: 8.8 mg/dL (ref 8.4–10.5)
Chloride: 105 mEq/L (ref 96–112)
Creatinine, Ser: 0.89 mg/dL (ref 0.40–1.20)
GFR: 69.09 mL/min (ref >60.00)
Glucose, Bld: 85 mg/dL (ref 70–99)
POTASSIUM: 4.2 meq/L (ref 3.5–5.1)
Sodium: 138 mEq/L (ref 135–145)
TOTAL PROTEIN: 6.4 g/dL (ref 6.0–8.3)
Total Bilirubin: 0.4 mg/dL (ref 0.2–1.2)

## 2014-03-10 LAB — LIPID PANEL
CHOL/HDL RATIO: 4
CHOLESTEROL: 229 mg/dL — AB (ref 0–200)
HDL: 64 mg/dL (ref >39.00)
LDL Cholesterol: 136 mg/dL — ABNORMAL HIGH (ref 0–99)
NonHDL: 165
TRIGLYCERIDES: 145 mg/dL (ref 0.0–149.0)
VLDL: 29 mg/dL (ref 0.0–40.0)

## 2014-03-10 LAB — TSH: TSH: 4.15 u[IU]/mL (ref 0.35–4.50)

## 2014-03-10 MED ORDER — METOPROLOL SUCCINATE ER 50 MG PO TB24: ORAL_TABLET | ORAL | Status: DC

## 2014-03-10 NOTE — Progress Notes (Signed)
Pre visit review using our clinic review tool, if applicable. No additional management support is needed unless otherwise documented below in the visit note. 

## 2014-03-10 NOTE — Assessment & Plan Note (Signed)
Due for labs- will check today. Orders Placed This Encounter  Procedures  . CBC with Differential/Platelet  . Comprehensive metabolic panel  . Lipid panel  . TSH

## 2014-03-10 NOTE — Assessment & Plan Note (Signed)
Well controlled on current rx. No changes made today. 

## 2014-03-10 NOTE — Patient Instructions (Signed)
Good to see you. I will call you with your lab results.  Try Claritin or Zyrtec.  Keep me updated.

## 2014-03-10 NOTE — Progress Notes (Signed)
59 yo very pleasant female here to follow up HTN.     HTN- on Metoprolol 50 mg XR daily.  She has been taking two tablets daily.  Denies any HA, blurred vision, CP or LE edema.   Has had some dizziness but not when standing- only in morning when she is in bed and changing head positions quickly.  Has had more trouble with nasal drainage and ear pressure. No cough or fevers.  Wt Readings from Last 3 Encounters:  03/10/14 124 lb 8 oz (56.473 kg)  11/16/13 122 lb 12.8 oz (55.702 kg)  09/11/13 122 lb 12 oz (55.679 kg)   Lab Results  Component Value Date   CREATININE 0.9 12/30/2012   BP Readings from Last 3 Encounters:  03/10/14 116/68  11/16/13 110/70  09/11/13 130/78       Patient Active Problem List   Diagnosis Date Noted  . Plant allergic contact dermatitis 09/11/2013  . Anal fissure 09/07/2013  . Internal hemorrhoids with itching, bleeding 09/07/2013  . Generalized anxiety disorder 03/05/2013  . Unspecified hypothyroidism 04/24/2012  . HLD (hyperlipidemia) 04/24/2012  . INCONTINENCE, URGE 12/19/2009  . ALLERGIC RHINITIS CAUSE UNSPECIFIED 08/08/2009  . DEPRESSION, MILD 01/29/2007  . HYPERTENSION, BENIGN ESSENTIAL 01/29/2007   Past Medical History  Diagnosis Date  . Hemorrhoids   . HTN (hypertension)   . Anxiety   . Hypothyroidism   . HLD (hyperlipidemia)   . Allergic rhinitis   . Depression   . Uterine fibroid   . Anal fissure    Past Surgical History  Procedure Laterality Date  . Abdominal hysterectomy  1995    partial, fibroids, Dr. Nori Riis  . Urethral dilation  1988    Dr. Leory Plowman  . Flexible sigmoidoscopy  2002  . Colonoscopy  2009   History  Substance Use Topics  . Smoking status: Former Research scientist (life sciences)  . Smokeless tobacco: Never Used  . Alcohol Use: No   Family History  Problem Relation Age of Onset  . Hypertension Mother   . Hyperlipidemia Mother   . Alcohol abuse Father   . Cirrhosis Father   . Hypertension Maternal Aunt   . Lung cancer Paternal  Aunt   . Hypertension Sister    No Known Allergies Current Outpatient Prescriptions on File Prior to Visit  Medication Sig Dispense Refill  . AMBULATORY NON FORMULARY MEDICATION Medication Name: Nitroglycerine ointment 0.125 %  Apply a pea sized amount internally four times daily. Dispense 30 GM zero refill 30 g 2  . cholecalciferol (VITAMIN D) 1000 UNITS tablet Take 1,000 Units by mouth daily.      Marland Kitchen diltiazem 2 % GEL Apply 1 application topically 2 (two) times daily. 30 g 2  . estradiol (ESTRACE) 1 MG tablet Take 1 mg by mouth daily.      . Multiple Vitamin (MULTIVITAMIN) tablet Take 1 tablet by mouth daily.       No current facility-administered medications on file prior to visit.   The PMH, PSH, Social History, Family History, Medications, and allergies have been reviewed in [facility], and have been updated if relevant.   Review of Systems       See HPI No CP No SOB No dizziness when she stands from a seated position Denies feeling depressed No anxiety but work is often stressful No SI or HI   Physical Exam BP 116/68 mmHg  Pulse 63  Temp(Src) 98.2 F (36.8 C) (Oral)  Wt 124 lb 8 oz (56.473 kg)  SpO2 95%  General:  Well-developed,well-nourished,in no acute distress; alert,appropriate and cooperative throughout examination Ears:  Tms retracted otherwise unremarkable Head:  Normocephalic and atraumatic without obvious abnormalities. No apparent alopecia or balding. Lungs:  Normal respiratory effort, chest expands symmetrically. Lungs are clear to auscultation, no crackles or wheezes. Heart:  Normal rate and regular rhythm. S1 and S2 normal without gallop, murmur, click, rub or other extra sounds. Abdomen:  Bowel sounds positive,abdomen soft and non-tender without masses, organomegaly or hernias noted. Msk:  No deformity or scoliosis noted of thoracic or lumbar spine.   Extremities:  No clubbing, cyanosis, edema, or deformity noted with normal full range of motion of all joints.    Neurologic:  No cranial nerve deficits noted. Station and gait are normal. Plantar reflexes are down-going bilaterally. DTRs are symmetrical throughout. Sensory, motor and coordinative functions appear intact. Skin:  Intact without suspicious lesions or rashes Psych:  Cognition and judgment appear intact.  Moderately anxious

## 2014-03-10 NOTE — Assessment & Plan Note (Signed)
Deteriorated. Advised trial of another antihistamine, like claritin or zyrtec. May be having some intermittent BPV due to ETD. Call or return to clinic prn if these symptoms worsen or fail to improve as anticipated. The patient indicates understanding of these issues and agrees with the plan.

## 2014-03-12 ENCOUNTER — Encounter: Payer: Self-pay | Admitting: *Deleted

## 2014-04-12 ENCOUNTER — Encounter: Payer: Self-pay | Admitting: Family Medicine

## 2014-04-13 MED ORDER — FLUTICASONE PROPIONATE 50 MCG/ACT NA SUSP: 2.0000 | Freq: Every day | NASAL | Status: DC

## 2014-04-13 NOTE — Telephone Encounter (Signed)
Medication no longer on Rx list.

## 2014-04-13 NOTE — Addendum Note (Signed)
Addended by: Modena Nunnery on: 04/13/2014 09:36 AM   Modules accepted: Orders

## 2014-05-31 ENCOUNTER — Encounter (HOSPITAL_COMMUNITY)
Admission: RE | Admit: 2014-05-31 | Discharge: 2014-05-31 | Disposition: A | Discharge status: Home / Self Care | Payer: BLUE CROSS/BLUE SHIELD | Source: Ambulatory Visit | Attending: Obstetrics & Gynecology | Admitting: Obstetrics & Gynecology

## 2014-05-31 DIAGNOSIS — M810 Age-related osteoporosis without current pathological fracture: Secondary | ICD-10-CM | POA: Insufficient documentation

## 2014-05-31 MED ORDER — ZOLEDRONIC ACID 5 MG/100ML IV SOLN
5.0000 mg | Freq: Once | INTRAVENOUS | Status: AC
  Administered 2014-05-31: 5 mg via INTRAVENOUS

## 2014-05-31 MED ORDER — ZOLEDRONIC ACID 5 MG/100ML IV SOLN
INTRAVENOUS | Status: AC
  Administered 2014-05-31: 5 mg via INTRAVENOUS
  Filled 2014-05-31: qty 100

## 2014-08-26 IMAGING — MG MM DIGITAL DIAGNOSTIC UNILAT*L*
3 series · 3 of 3 positions shown · non-contrast
Comparison: A 02/25/2012, 02/12/2011, 02/10/2010, 02/08/2009

CLINICAL DATA: Calcifications left breast identified on recent
screening mammogram.

DIGITAL DIAGNOSTIC LEFT MAMMOGRAM WITHOUT CAD

[L CC (1 of 2)]
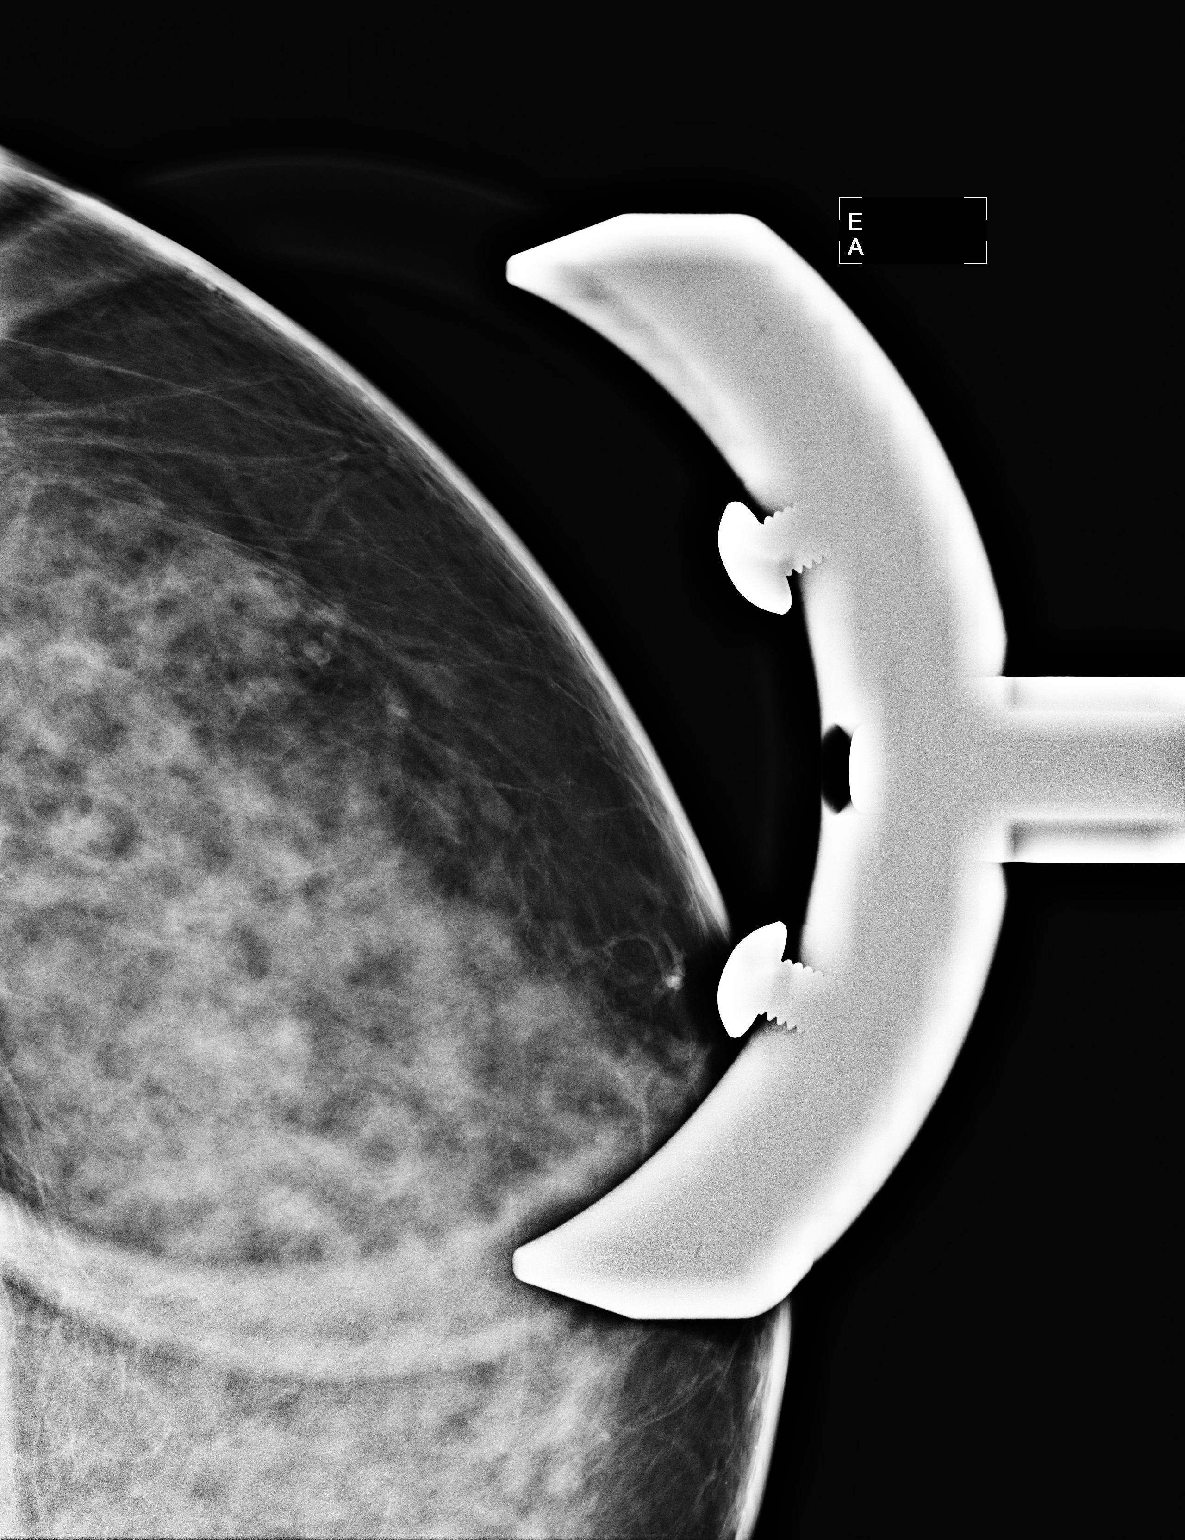

[L ML]
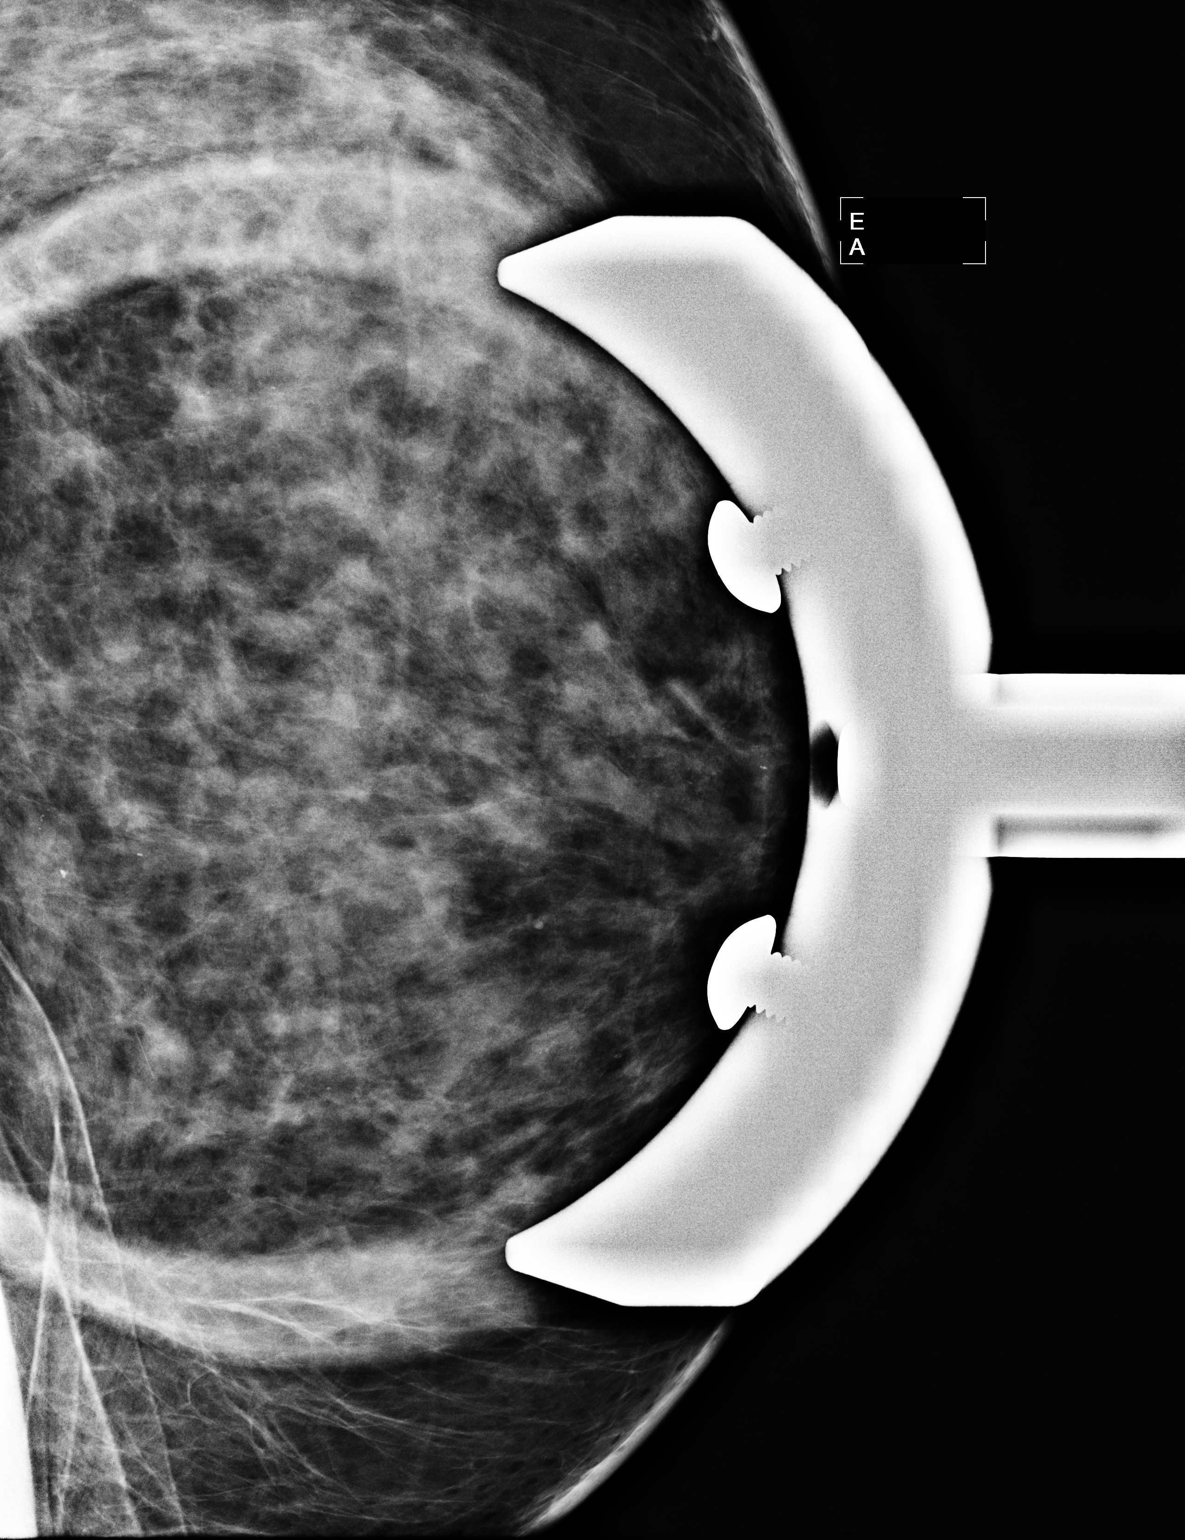

[L CC (2 of 2)]
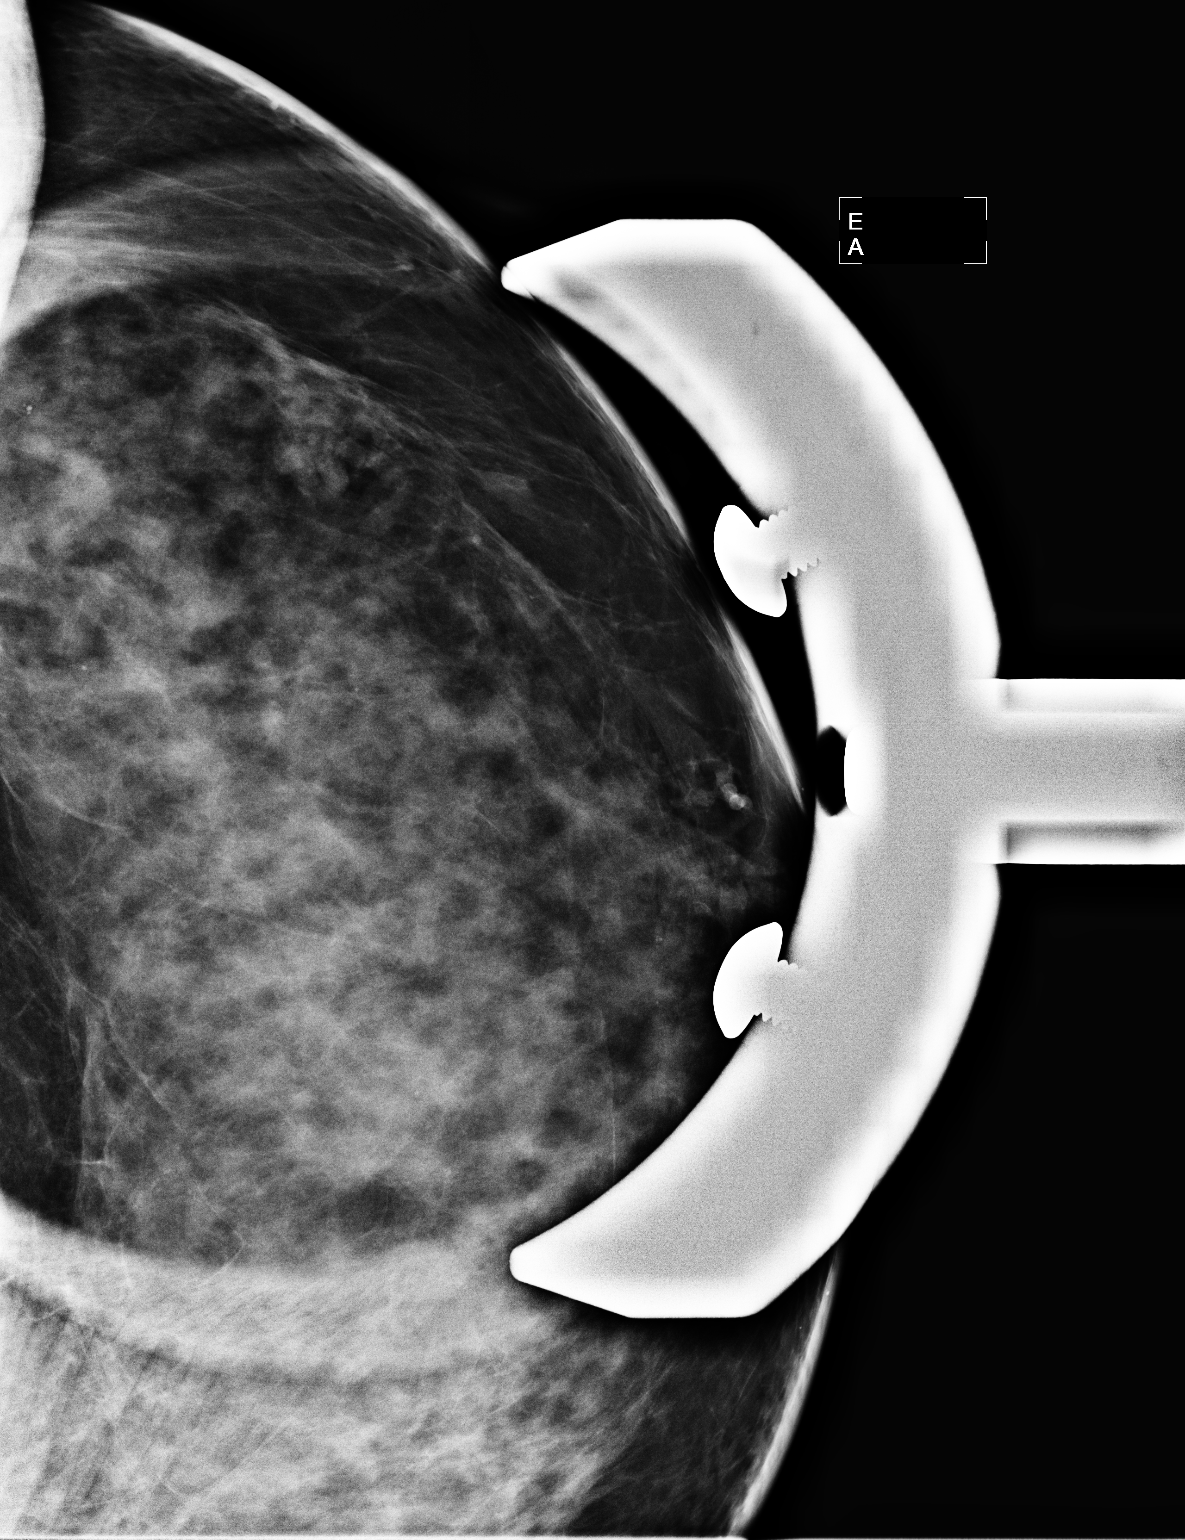

[3 of 3 positions shown; findings below may reference images not displayed]

FINDINGS: ACR Breast Density Category 3: The breast tissue is heterogeneously
dense.

Magnification views of the deep outer right breast near the level
of the nipple demonstrate a 5mm cluster of punctate to round
calcifications.  A single coarse calcification is seen in this
region as well.  This cluster calcifications measures up to 5 mm.
There are no suspicious linear forms.
IMPRESSION: Probably benign calcifications in the left breast.

RECOMMENDATION:
6-month follow-up left mammogram with magnification views.

I have discussed the findings and recommendations with the patient.
Results were also provided in writing at the conclusion of the
visit.

BI-RADS CATEGORY 3:  Probably benign finding(s) - short interval
follow-up suggested.

## 2014-09-06 ENCOUNTER — Telehealth: Payer: Self-pay

## 2014-09-06 NOTE — Telephone Encounter (Signed)
Left a voicemail for patient to return my call, in regards to scheduling a Mammogram.  

## 2015-03-31 ENCOUNTER — Ambulatory Visit (INDEPENDENT_AMBULATORY_CARE_PROVIDER_SITE_OTHER): Payer: BLUE CROSS/BLUE SHIELD | Admitting: Family Medicine

## 2015-03-31 ENCOUNTER — Encounter: Payer: Self-pay | Admitting: Family Medicine

## 2015-03-31 VITALS — BP 114/62 | HR 71 | Temp 98.0°F | Wt 124.5 lb

## 2015-03-31 DIAGNOSIS — I10 Essential (primary) hypertension: Secondary | ICD-10-CM

## 2015-03-31 MED ORDER — METOPROLOL SUCCINATE ER 25 MG PO TB24: 25.0000 mg | ORAL_TABLET | Freq: Every day | ORAL | Status: DC

## 2015-03-31 MED ORDER — METOPROLOL SUCCINATE ER 50 MG PO TB24: ORAL_TABLET | ORAL | Status: DC

## 2015-03-31 NOTE — Patient Instructions (Signed)
Good to see you. We are decreasing your Toprol XL to 25 mg daily.  Please check your blood pressure at home and call me.   Please do forward the blood work they are doing at work.

## 2015-03-31 NOTE — Assessment & Plan Note (Signed)
BP on low side today and she is having symptoms of possible orthostasis. Decrease toprol XL to 25 mg daily. She has a BP cuff at home and will monitor it. She declines blood work today as they are doing a full panel at work next week. I did request that she have those results faxed to me. The patient indicates understanding of these issues and agrees with the plan.

## 2015-03-31 NOTE — Progress Notes (Signed)
60 yo very pleasant female here to follow up HTN.     HTN- on Metoprolol 50 mg XR daily.  Denies any HA, blurred vision, CP or LE edema.    She is has noticed past several months that she is more fatigued and dizzy when she stands from a seated position.     Wt Readings from Last 3 Encounters:  03/31/15 124 lb 8 oz (56.473 kg)  05/31/14 123 lb (55.792 kg)  03/10/14 124 lb 8 oz (56.473 kg)   Lab Results  Component Value Date   CREATININE 0.89 03/10/2014   BP Readings from Last 3 Encounters:  03/31/15 114/62  05/31/14 116/69  03/10/14 116/68       Patient Active Problem List   Diagnosis Date Noted  . Plant allergic contact dermatitis 09/11/2013  . Anal fissure 09/07/2013  . Internal hemorrhoids with itching, bleeding 09/07/2013  . Generalized anxiety disorder 03/05/2013  . Unspecified hypothyroidism 04/24/2012  . HLD (hyperlipidemia) 04/24/2012  . INCONTINENCE, URGE 12/19/2009  . Allergic rhinitis 08/08/2009  . DEPRESSION, MILD 01/29/2007  . HYPERTENSION, BENIGN ESSENTIAL 01/29/2007   Past Medical History  Diagnosis Date  . Hemorrhoids   . HTN (hypertension)   . Anxiety   . Hypothyroidism   . HLD (hyperlipidemia)   . Allergic rhinitis   . Depression   . Uterine fibroid   . Anal fissure    Past Surgical History  Procedure Laterality Date  . Abdominal hysterectomy  1995    partial, fibroids, Dr. Nori Riis  . Urethral dilation  1988    Dr. Leory Plowman  . Flexible sigmoidoscopy  2002  . Colonoscopy  2009   Social History  Substance Use Topics  . Smoking status: Former Research scientist (life sciences)  . Smokeless tobacco: Never Used  . Alcohol Use: No   Family History  Problem Relation Age of Onset  . Hypertension Mother   . Hyperlipidemia Mother   . Alcohol abuse Father   . Cirrhosis Father   . Hypertension Maternal Aunt   . Lung cancer Paternal Aunt   . Hypertension Sister    No Known Allergies Current Outpatient Prescriptions on File Prior to Visit  Medication Sig Dispense  Refill  . cholecalciferol (VITAMIN D) 1000 UNITS tablet Take 1,000 Units by mouth daily.      Marland Kitchen diltiazem 2 % GEL Apply 1 application topically 2 (two) times daily. 30 g 2  . estradiol (ESTRACE) 1 MG tablet Take 1 mg by mouth daily.      . fluticasone (FLONASE) 50 MCG/ACT nasal spray Place 2 sprays into both nostrils daily. 16 g 6  . Multiple Vitamin (MULTIVITAMIN) tablet Take 1 tablet by mouth daily.       No current facility-administered medications on file prior to visit.   The PMH, PSH, Social History, Family History, Medications, and allergies have been reviewed in [facility], and have been updated if relevant.   Review of Systems  Constitutional: Positive for fatigue.  HENT: Negative.   Eyes: Negative.   Respiratory: Negative.   Cardiovascular: Negative.  Negative for palpitations.  Gastrointestinal: Negative.   Endocrine: Negative.   Musculoskeletal: Negative.   Skin: Negative.   Allergic/Immunologic: Negative.   Neurological: Positive for dizziness. Negative for tremors, seizures, syncope, facial asymmetry, speech difficulty, weakness, light-headedness, numbness and headaches.  Hematological: Negative.   Psychiatric/Behavioral: Negative.   All other systems reviewed and are negative.     Physical Exam BP 114/62 mmHg  Pulse 71  Temp(Src) 98 F (36.7 C) (Oral)  Wt  124 lb 8 oz (56.473 kg)  SpO2 98%  General:  Well-developed,well-nourished,in no acute distress; alert,appropriate and cooperative throughout examination Ears:  Tms retracted otherwise unremarkable Head:  Normocephalic and atraumatic without obvious abnormalities. No apparent alopecia or balding. Lungs:  Normal respiratory effort, chest expands symmetrically. Lungs are clear to auscultation, no crackles or wheezes. Heart:  Normal rate and regular rhythm. S1 and S2 normal without gallop, murmur, click, rub or other extra sounds. Abdomen:  Bowel sounds positive,abdomen soft and non-tender without masses,  organomegaly or hernias noted. Msk:  No deformity or scoliosis noted of thoracic or lumbar spine.   Extremities:  No clubbing, cyanosis, edema, or deformity noted with normal full range of motion of all joints.   Neurologic:  No cranial nerve deficits noted. Station and gait are normal. Plantar reflexes are down-going bilaterally. DTRs are symmetrical throughout. Sensory, motor and coordinative functions appear intact. Skin:  Intact without suspicious lesions or rashes Psych:  Cognition and judgment appear intact.

## 2015-04-01 ENCOUNTER — Encounter: Payer: Self-pay | Admitting: Family Medicine

## 2015-04-19 ENCOUNTER — Other Ambulatory Visit: Payer: Self-pay | Admitting: Obstetrics & Gynecology

## 2015-04-19 DIAGNOSIS — R928 Other abnormal and inconclusive findings on diagnostic imaging of breast: Secondary | ICD-10-CM

## 2015-04-25 ENCOUNTER — Ambulatory Visit
Admission: RE | Admit: 2015-04-25 | Discharge: 2015-04-25 | Disposition: A | Discharge status: Home / Self Care | Payer: BLUE CROSS/BLUE SHIELD | Source: Ambulatory Visit | Attending: Obstetrics & Gynecology | Admitting: Obstetrics & Gynecology

## 2015-04-25 DIAGNOSIS — R928 Other abnormal and inconclusive findings on diagnostic imaging of breast: Secondary | ICD-10-CM

## 2015-05-04 ENCOUNTER — Other Ambulatory Visit: Payer: Self-pay | Admitting: *Deleted

## 2015-05-04 MED ORDER — METOPROLOL SUCCINATE ER 25 MG PO TB24: 25.0000 mg | ORAL_TABLET | Freq: Every day | ORAL | Status: DC

## 2015-05-12 ENCOUNTER — Encounter: Payer: Self-pay | Admitting: Family Medicine

## 2015-08-29 ENCOUNTER — Other Ambulatory Visit: Payer: Self-pay | Admitting: Family Medicine

## 2015-10-03 ENCOUNTER — Encounter: Payer: Self-pay | Admitting: Family Medicine

## 2015-10-03 ENCOUNTER — Ambulatory Visit (INDEPENDENT_AMBULATORY_CARE_PROVIDER_SITE_OTHER): Payer: BLUE CROSS/BLUE SHIELD | Admitting: Family Medicine

## 2015-10-03 VITALS — BP 126/70 | HR 70 | Temp 98.2°F | Wt 125.5 lb

## 2015-10-03 DIAGNOSIS — E039 Hypothyroidism, unspecified: Secondary | ICD-10-CM

## 2015-10-03 DIAGNOSIS — F411 Generalized anxiety disorder: Secondary | ICD-10-CM | POA: Diagnosis not present

## 2015-10-03 DIAGNOSIS — E785 Hyperlipidemia, unspecified: Secondary | ICD-10-CM

## 2015-10-03 DIAGNOSIS — Z1159 Encounter for screening for other viral diseases: Secondary | ICD-10-CM

## 2015-10-03 DIAGNOSIS — I10 Essential (primary) hypertension: Secondary | ICD-10-CM

## 2015-10-03 LAB — HEPATITIS C ANTIBODY: HCV Ab: NEGATIVE

## 2015-10-03 MED ORDER — ZOSTER VACCINE LIVE 19400 UNT/0.65ML ~~LOC~~ SUSR: 0.6500 mL | Freq: Once | SUBCUTANEOUS | 0 refills | Status: AC

## 2015-10-03 MED ORDER — FLUTICASONE PROPIONATE 50 MCG/ACT NA SUSP: 2.0000 | Freq: Every day | NASAL | 6 refills | Status: AC

## 2015-10-03 NOTE — Progress Notes (Signed)
Pre visit review using our clinic review tool, if applicable. No additional management support is needed unless otherwise documented below in the visit note. 

## 2015-10-03 NOTE — Progress Notes (Signed)
60  yo very pleasant female here to follow up HTN.     HTN- on Metoprolol 25 mg XR daily- dose was decreased from 50 mg daily in 03/2015 due to orthostatic hypotension symptoms.  Denies any HA, blurred vision, CP or LE edema.      Wt Readings from Last 3 Encounters:  10/03/15 125 lb 8 oz (56.9 kg)  03/31/15 124 lb 8 oz (56.5 kg)  05/31/14 123 lb (55.8 kg)   Lab Results  Component Value Date   CREATININE 0.89 03/10/2014   BP Readings from Last 3 Encounters:  10/03/15 126/70  03/31/15 114/62  05/31/14 116/69       Patient Active Problem List  Diagnosis  . DEPRESSION, MILD  . HYPERTENSION, BENIGN ESSENTIAL  . Allergic rhinitis  . INCONTINENCE, URGE  . Hypothyroidism  . HLD (hyperlipidemia)  . Generalized anxiety disorder   Past Medical History:  Diagnosis Date  . Allergic rhinitis   . Anal fissure   . Anxiety   . Depression   . Hemorrhoids   . HLD (hyperlipidemia)   . HTN (hypertension)   . Hypothyroidism   . Uterine fibroid    Past Surgical History:  Procedure Laterality Date  . ABDOMINAL HYSTERECTOMY  1995   partial, fibroids, Dr. Nori Riis  . COLONOSCOPY  2009  . FLEXIBLE SIGMOIDOSCOPY  2002  . URETHRAL DILATION  1988   Dr. Leory Plowman   Social History  Substance Use Topics  . Smoking status: Former Research scientist (life sciences)  . Smokeless tobacco: Never Used  . Alcohol use No   Family History  Problem Relation Age of Onset  . Hypertension Mother   . Hyperlipidemia Mother   . Alcohol abuse Father   . Cirrhosis Father   . Hypertension Maternal Aunt   . Lung cancer Paternal Aunt   . Hypertension Sister    No Known Allergies Current Outpatient Prescriptions on File Prior to Visit  Medication Sig Dispense Refill  . cholecalciferol (VITAMIN D) 1000 UNITS tablet Take 1,000 Units by mouth daily.      Marland Kitchen diltiazem 2 % GEL Apply 1 application topically 2 (two) times daily. 30 g 2  . estradiol (ESTRACE) 1 MG tablet Take 1 mg by mouth daily.      . fluticasone (FLONASE) 50  MCG/ACT nasal spray Place 2 sprays into both nostrils daily. 16 g 6  . metoprolol succinate (TOPROL-XL) 25 MG 24 hr tablet TAKE ONE TABLET BY MOUTH DAILY 30 tablet 6  . Multiple Vitamin (MULTIVITAMIN) tablet Take 1 tablet by mouth daily.       No current facility-administered medications on file prior to visit.    The PMH, PSH, Social History, Family History, Medications, and allergies have been reviewed in [facility], and have been updated if relevant.   Review of Systems  Constitutional: Negative for fatigue.  HENT: Negative.   Eyes: Negative.   Respiratory: Negative.   Cardiovascular: Negative.  Negative for palpitations.  Gastrointestinal: Negative.   Endocrine: Negative.   Musculoskeletal: Negative.   Skin: Negative.   Allergic/Immunologic: Negative.   Neurological: Negative for dizziness, tremors, seizures, syncope, facial asymmetry, speech difficulty, weakness, light-headedness, numbness and headaches.  Hematological: Negative.   Psychiatric/Behavioral: Negative.   All other systems reviewed and are negative.     Physical Exam BP 126/70   Pulse 70   Temp 98.2 F (36.8 C) (Oral)   Wt 125 lb 8 oz (56.9 kg)   SpO2 97%   BMI 25.78 kg/m   General:  Well-developed,well-nourished,in no  acute distress; alert,appropriate and cooperative throughout examination Ears:  Tms retracted otherwise unremarkable Head:  Normocephalic and atraumatic without obvious abnormalities. No apparent alopecia or balding. Lungs:  Normal respiratory effort, chest expands symmetrically. Lungs are clear to auscultation, no crackles or wheezes. Heart:  Normal rate and regular rhythm. S1 and S2 normal without gallop, murmur, click, rub or other extra sounds. Abdomen:  Bowel sounds positive,abdomen soft and non-tender without masses, organomegaly or hernias noted. Msk:  No deformity or scoliosis noted of thoracic or lumbar spine.   Extremities:  No clubbing, cyanosis, edema, or deformity noted with normal  full range of motion of all joints.   Neurologic:  No cranial nerve deficits noted. Station and gait are normal. Plantar reflexes are down-going bilaterally. DTRs are symmetrical throughout. Sensory, motor and coordinative functions appear intact. Skin:  Intact without suspicious lesions or rashes Psych:  Cognition and judgment appear intact.

## 2015-10-03 NOTE — Patient Instructions (Signed)
Check with your insurance to see if they will cover the shingles shot.

## 2015-10-03 NOTE — Assessment & Plan Note (Signed)
Well controlled. No changes made today. 

## 2015-10-04 LAB — LIPID PANEL
CHOLESTEROL: 243 mg/dL — AB (ref 0–200)
HDL: 72.3 mg/dL (ref >39.00)
LDL Cholesterol: 131 mg/dL — ABNORMAL HIGH (ref 0–99)
NonHDL: 170.54
TRIGLYCERIDES: 196 mg/dL — AB (ref 0.0–149.0)
Total CHOL/HDL Ratio: 3
VLDL: 39.2 mg/dL (ref 0.0–40.0)

## 2015-10-04 LAB — COMPREHENSIVE METABOLIC PANEL
ALBUMIN: 3.9 g/dL (ref 3.5–5.2)
ALT: 13 U/L (ref 0–35)
AST: 21 U/L (ref 0–37)
Alkaline Phosphatase: 51 U/L (ref 39–117)
BUN: 19 mg/dL (ref 6–23)
CO2: 30 mEq/L (ref 19–32)
Calcium: 8.9 mg/dL (ref 8.4–10.5)
Chloride: 104 mEq/L (ref 96–112)
Creatinine, Ser: 0.98 mg/dL (ref 0.40–1.20)
GFR: 61.49 mL/min (ref >60.00)
Glucose, Bld: 85 mg/dL (ref 70–99)
POTASSIUM: 4.5 meq/L (ref 3.5–5.1)
Sodium: 140 mEq/L (ref 135–145)
Total Bilirubin: 0.2 mg/dL (ref 0.2–1.2)
Total Protein: 6.8 g/dL (ref 6.0–8.3)

## 2015-10-31 DIAGNOSIS — Z85820 Personal history of malignant melanoma of skin: Secondary | ICD-10-CM | POA: Diagnosis not present

## 2015-10-31 DIAGNOSIS — Z85828 Personal history of other malignant neoplasm of skin: Secondary | ICD-10-CM | POA: Diagnosis not present

## 2015-10-31 DIAGNOSIS — L820 Inflamed seborrheic keratosis: Secondary | ICD-10-CM | POA: Diagnosis not present

## 2015-10-31 DIAGNOSIS — Z1283 Encounter for screening for malignant neoplasm of skin: Secondary | ICD-10-CM | POA: Diagnosis not present

## 2015-10-31 DIAGNOSIS — D485 Neoplasm of uncertain behavior of skin: Secondary | ICD-10-CM | POA: Diagnosis not present

## 2016-03-29 ENCOUNTER — Other Ambulatory Visit: Payer: Self-pay | Admitting: Family Medicine

## 2016-04-30 DIAGNOSIS — Z85820 Personal history of malignant melanoma of skin: Secondary | ICD-10-CM | POA: Diagnosis not present

## 2016-04-30 DIAGNOSIS — Z1283 Encounter for screening for malignant neoplasm of skin: Secondary | ICD-10-CM | POA: Diagnosis not present

## 2016-04-30 DIAGNOSIS — Z85828 Personal history of other malignant neoplasm of skin: Secondary | ICD-10-CM | POA: Diagnosis not present

## 2016-04-30 DIAGNOSIS — L820 Inflamed seborrheic keratosis: Secondary | ICD-10-CM | POA: Diagnosis not present

## 2016-04-30 DIAGNOSIS — D485 Neoplasm of uncertain behavior of skin: Secondary | ICD-10-CM | POA: Diagnosis not present

## 2016-05-01 DIAGNOSIS — N760 Acute vaginitis: Secondary | ICD-10-CM | POA: Diagnosis not present

## 2016-05-01 DIAGNOSIS — Z1231 Encounter for screening mammogram for malignant neoplasm of breast: Secondary | ICD-10-CM | POA: Diagnosis not present

## 2016-05-01 DIAGNOSIS — Z01419 Encounter for gynecological examination (general) (routine) without abnormal findings: Secondary | ICD-10-CM | POA: Diagnosis not present

## 2016-05-01 DIAGNOSIS — Z13820 Encounter for screening for osteoporosis: Secondary | ICD-10-CM | POA: Diagnosis not present

## 2016-05-01 DIAGNOSIS — Z6826 Body mass index (BMI) 26.0-26.9, adult: Secondary | ICD-10-CM | POA: Diagnosis not present

## 2016-05-03 ENCOUNTER — Other Ambulatory Visit: Payer: Self-pay | Admitting: Family Medicine

## 2016-05-03 ENCOUNTER — Telehealth: Payer: Self-pay | Admitting: Family Medicine

## 2016-05-03 MED ORDER — METOPROLOL SUCCINATE ER 50 MG PO TB24: 50.0000 mg | ORAL_TABLET | Freq: Every day | ORAL | 3 refills | Status: DC

## 2016-05-03 NOTE — Telephone Encounter (Signed)
Yes I have increased her Toprol XL to 50 mg daily- eRx sent.  Please continue to monitor blood pressures and to keep [place] updated.

## 2016-05-03 NOTE — Telephone Encounter (Signed)
Pt left v/m; Pt has had 2 high readings of BP this week.on 05/01/16 BP 147/98 and on 05/03/16 BP 150/117 and then reck 159/104. No added stress or pain. Pt has a H/A for 3 days.pt has not missed any med. No CP.SOB, dizziness or N&V. pt taking metoprolol 25 mg daily. Pt wants to now if metoprolol could be increased.[facility] pt request cb after reviewed by Dr Deborra Medina.

## 2016-05-03 NOTE — Telephone Encounter (Signed)
Notified pt Rx Toprol been increased to 50 mg and send to the pharmacy

## 2016-05-03 NOTE — Telephone Encounter (Deleted)
Refilled rx today

## 2016-07-15 ENCOUNTER — Encounter: Payer: Self-pay | Admitting: Family Medicine

## 2016-07-17 ENCOUNTER — Ambulatory Visit (INDEPENDENT_AMBULATORY_CARE_PROVIDER_SITE_OTHER): Payer: BLUE CROSS/BLUE SHIELD | Admitting: Family Medicine

## 2016-07-17 ENCOUNTER — Encounter: Payer: Self-pay | Admitting: Family Medicine

## 2016-07-17 DIAGNOSIS — K921 Melena: Secondary | ICD-10-CM

## 2016-07-17 DIAGNOSIS — R319 Hematuria, unspecified: Secondary | ICD-10-CM | POA: Insufficient documentation

## 2016-07-17 MED ORDER — HYDROCORTISONE 2.5 % RE CREA: 1.0000 "application " | TOPICAL_CREAM | Freq: Two times a day (BID) | RECTAL | 0 refills | Status: DC

## 2016-07-17 NOTE — Patient Instructions (Signed)
Great to see you. We will call you with your GI referral.  Let me know if the anusol works or does not help.   Hemorrhoids Hemorrhoids are swollen veins in and around the rectum or anus. Hemorrhoids can cause pain, itching, or bleeding. Most of the time, they do not cause serious problems. They usually get better with diet changes, lifestyle changes, and other home treatments. Follow these instructions at home: Eating and drinking  Eat foods that have fiber, such as whole grains, beans, nuts, fruits, and vegetables. Ask your doctor about taking products that have added fiber (fibersupplements).  Drink enough fluid to keep your pee (urine) clear or pale yellow. For Pain and Swelling  Take a warm-water bath (sitz bath) for 20 minutes to ease pain. Do this 3-4 times a day.  If directed, put ice on the painful area. It may be helpful to use ice between your warm baths. ? Put ice in a plastic bag. ? Place a towel between your skin and the bag. ? Leave the ice on for 20 minutes, 2-3 times a day. General instructions  Take over-the-counter and prescription medicines only as told by your doctor. ? Medicated creams and medicines that are inserted into the anus (suppositories) may be used or applied as told.  Exercise often.  Go to the bathroom when you have the urge to poop (to have a bowel movement). Do not wait.  Avoid pushing too hard (straining) when you poop.  Keep the butt area dry and clean. Use wet toilet paper or moist paper towels.  Do not sit on the toilet for a long time. Contact a doctor if:  You have any of these: ? Pain and swelling that do not get better with treatment or medicine. ? Bleeding that will not stop. ? Trouble pooping or you cannot poop. ? Pain or swelling outside the area of the hemorrhoids. This information is not intended to replace advice given to you by your health care provider. Make sure you discuss any questions you have with your health care  provider. Document Released: 11/08/2007 Document Revised: 07/07/2015 Document Reviewed: 10/13/2014 Elsevier Interactive Patient Education  [employer].

## 2016-07-17 NOTE — Progress Notes (Signed)
Subjective:   Patient ID: Terri Espinoza, female    DOB: 03-Apr-1955, 61 y.o.   MRN: [id number]  Terri Espinoza is a pleasant 61 y.o. year old female who presents to clinic today with Blood In Stools  on 07/17/2016  HPI:  Blood in stool- intermittent for months.  Has had issues with bleeding external hemorrhoids in past.  This seems similar.  BRB, not with every BM.  Does have intermittent constipation. Not painful but can be itchy.   Last colonoscopy 03/25/07- Dr. Ardis Hughs.  External hemorrhoids, otherwise normal.     Current Outpatient Prescriptions on File Prior to Visit  Medication Sig Dispense Refill  . cholecalciferol (VITAMIN D) 1000 UNITS tablet Take 1,000 Units by mouth daily.      Marland Kitchen diltiazem 2 % GEL Apply 1 application topically 2 (two) times daily. 30 g 2  . estradiol (ESTRACE) 1 MG tablet Take 1 mg by mouth daily.      . fluticasone (FLONASE) 50 MCG/ACT nasal spray Place 2 sprays into both nostrils daily. 16 g 6  . metoprolol succinate (TOPROL-XL) 50 MG 24 hr tablet Take 1 tablet (50 mg total) by mouth daily. Take with or immediately following a meal. 90 tablet 3  . Multiple Vitamin (MULTIVITAMIN) tablet Take 1 tablet by mouth daily.       No current facility-administered medications on file prior to visit.     No Known Allergies  Past Medical History:  Diagnosis Date  . Allergic rhinitis   . Anal fissure   . Anxiety   . Depression   . Hemorrhoids   . HLD (hyperlipidemia)   . HTN (hypertension)   . Hypothyroidism   . Uterine fibroid     Past Surgical History:  Procedure Laterality Date  . ABDOMINAL HYSTERECTOMY  1995   partial, fibroids, Dr. Nori Riis  . COLONOSCOPY  2009  . FLEXIBLE SIGMOIDOSCOPY  2002  . URETHRAL DILATION  1988   Dr. Leory Plowman    Family History  Problem Relation Age of Onset  . Hypertension Mother   . Hyperlipidemia Mother   . Alcohol abuse Father   . Cirrhosis Father   . Hypertension Maternal Aunt   . Lung cancer Paternal Aunt     . Hypertension Sister     Social History   Social History  . Marital status: Divorced    Spouse name: N/A  . Number of children: 0  . Years of education: N/A   Occupational History  . [employer], [place]   Social History Main Topics  . Smoking status: Former Research scientist (life sciences)  . Smokeless tobacco: Never Used  . Alcohol use No  . Drug use: No  . Sexual activity: Not on file   Other Topics Concern  . 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 One caffeinated beverage daily. No children.   Updated as of 09/07/2013   The PMH, PSH, Social History, Family History, Medications, and allergies have been reviewed in [facility], and have been updated if relevant.   Review of Systems  Gastrointestinal: Positive for anal bleeding, blood in stool and constipation. Negative for abdominal distention, abdominal pain, diarrhea, nausea and rectal pain.  Genitourinary: Negative.        Objective:    BP 120/70   Pulse 71   Temp 98 F (36.7 C)   Wt 125 lb (56.7 kg)   SpO2 98%   BMI 25.68 kg/m    Physical Exam  Constitutional:  She is oriented to person, place, and time. She appears well-developed and well-nourished. No distress.  HENT:  Head: Normocephalic and atraumatic.  Eyes: Conjunctivae are normal.  Cardiovascular: Normal rate.   Pulmonary/Chest: Effort normal.  Abdominal: Soft. Bowel sounds are normal. She exhibits no distension. There is no tenderness. There is no rebound.  Genitourinary: Rectal exam shows external hemorrhoid. Rectal exam shows no fissure, no mass and no tenderness.  Neurological: She is alert and oriented to person, place, and time. No cranial nerve deficit.  Skin: Skin is warm and dry. She is not diaphoretic.  Psychiatric: She has a normal mood and affect. Her behavior is normal. Judgment and thought content normal.  Nursing note and vitals reviewed.         Assessment & Plan:   Blood in stool - Plan:  Ambulatory referral to Gastroenterology No Follow-up on file.

## 2016-07-17 NOTE — Assessment & Plan Note (Signed)
External hemorrhoid on exam, non thrombosed. eRx sent for annusol cream, advised sitz baths. Refer back to GI for colonoscopy to rule out internal hemorrhoids, polyps and malignancy. The patient indicates understanding of these issues and agrees with the plan.

## 2016-07-27 ENCOUNTER — Encounter: Payer: Self-pay | Admitting: Gastroenterology

## 2016-07-27 ENCOUNTER — Ambulatory Visit (INDEPENDENT_AMBULATORY_CARE_PROVIDER_SITE_OTHER): Payer: BLUE CROSS/BLUE SHIELD | Admitting: Gastroenterology

## 2016-07-27 VITALS — BP 126/74 | HR 68 | Ht 59.0 in | Wt 127.8 lb

## 2016-07-27 DIAGNOSIS — K602 Anal fissure, unspecified: Secondary | ICD-10-CM

## 2016-07-27 DIAGNOSIS — K648 Other hemorrhoids: Secondary | ICD-10-CM | POA: Diagnosis not present

## 2016-07-27 DIAGNOSIS — K625 Hemorrhage of anus and rectum: Secondary | ICD-10-CM

## 2016-07-27 MED ORDER — AMBULATORY NON FORMULARY MEDICATION: 0.1250 mg | Freq: Two times a day (BID) | 1 refills | Status: DC

## 2016-07-27 NOTE — Patient Instructions (Addendum)
We have sent a prescription for nitroglycerin 0.125% gel to [facility]. You should apply a pea size amount to your rectum twice a day x 6-8 weeks.  Use Miralax once daily.   Call back in 6-8 weeks with an update on symptoms and ask for Katina Degree, RN.

## 2016-07-27 NOTE — Progress Notes (Signed)
07/27/2016 Terri Espinoza [id number] 12/02/1955   HISTORY OF PRESENT ILLNESS:  This is a 61 year old female who is previously known to Dr. Ardis Hughs for colonoscopy in February 2009 at which time she is found have only small external hemorrhoids. She was then seen by Dr. [name] on two occasions in 2015 for possible internal hemorrhoid banding, but she was found have a fissure at that time and that was treated instead. She now presents to our office again today with complaints of bright red blood in her stool on and off since December. She says that she was recently seen by her PCP and was given hydrocortisone cream to use for hemorrhoids.  She does admit to constipation and straining with bowel movements. She does admit to rectal pain with bowel movements recently again as well.   Past Medical History:  Diagnosis Date  . Allergic rhinitis   . Anal fissure   . Anxiety   . Depression   . Hemorrhoids   . HLD (hyperlipidemia)   . HTN (hypertension)   . Hypothyroidism   . Uterine fibroid    Past Surgical History:  Procedure Laterality Date  . ABDOMINAL HYSTERECTOMY  1995   partial, fibroids, Dr. Nori Riis  . COLONOSCOPY  2009  . FLEXIBLE SIGMOIDOSCOPY  2002  . URETHRAL DILATION  1988   Dr. Leory Plowman    reports that she has quit smoking. She has never used smokeless tobacco. She reports that she does not drink alcohol or use drugs. family history includes Alcohol abuse in her father; Cirrhosis in her father; Hyperlipidemia in her mother; Hypertension in her maternal aunt, mother, and sister; Lung cancer in her paternal aunt. No Known Allergies    Outpatient Encounter Prescriptions as of 07/27/2016  Medication Sig  . Calcium-Magnesium-Vitamin D (CALCIUM 500 PO) Take by mouth.  . cholecalciferol (VITAMIN D) 1000 UNITS tablet Take 5,000 Units by mouth daily.   Marland Kitchen diltiazem 2 % GEL Apply 1 application topically 2 (two) times daily.  Marland Kitchen estradiol (ESTRACE) 1 MG tablet Take 1 mg by mouth  daily.    . fluticasone (FLONASE) 50 MCG/ACT nasal spray Place 2 sprays into both nostrils daily.  . metoprolol succinate (TOPROL-XL) 50 MG 24 hr tablet Take 1 tablet (50 mg total) by mouth daily. Take with or immediately following a meal. (Patient taking differently: Take 25 mg by mouth daily. Take with or immediately following a meal.)  . Multiple Vitamin (MULTIVITAMIN) tablet Take 1 tablet by mouth daily.    . hydrocortisone (ANUSOL-HC) 2.5 % rectal cream Place 1 application rectally 2 (two) times daily. (Patient not taking: Reported on 07/27/2016)   No facility-administered encounter medications on file as of 07/27/2016.      REVIEW OF SYSTEMS  : All other systems reviewed and negative except where noted in the History of Present Illness.   PHYSICAL EXAM: BP 126/74   Pulse 68   Ht 4\' 11"  (1.499 m)   Wt 127 lb 12.8 oz (58 kg)   SpO2 98%   BMI 25.81 kg/m  General: Well developed white female in no acute distress Head: Normocephalic and atraumatic Eyes:  Sclerae anicteric, conjunctiva pink. Ears: Normal auditory acuity Lungs: Clear throughout to auscultation; no increased WOB. Heart: Regular rate and rhythm Abdomen: Soft, non-distended. Normal bowel sounds.  Non-tender. Rectal:  No external abnormalities noted.  DRE did not reveal any masses.  Anoscopy was performed and showed moderate sized internal hemorrhoids with some irritation posteriorly and suspect fissure in that area  as well. Musculoskeletal: Symmetrical with no gross deformities  Skin: No lesions on visible extremities Extremities: No edema  Neurological: Alert oriented x 4, grossly non-focal Psychological:  Alert and cooperative. Normal mood and affect  ASSESSMENT AND PLAN: -Rectal bleeding:  Does have large internal hemorrhoids but also appears to have a fissure again as well.  May benefit from internal hemorrhoid banding, but once again, we need to treat the fissure first.  Will treat with nitroglycerin gel 0.125% BID  for 6-8 weeks.   -Constipation:  Likely contributing to and worsening her hemorrhoids and fissure.  Will begin Miralax daily.   *She will call back in 6-8 weeks with an update on her symptoms.   CC:  Lucille Passy, MD

## 2016-07-30 ENCOUNTER — Telehealth: Payer: Self-pay | Admitting: Gastroenterology

## 2016-07-30 NOTE — Telephone Encounter (Signed)
Medication verified as Nitroglycerin gel 0.125 % and not 0.125 mg.

## 2016-09-22 ENCOUNTER — Other Ambulatory Visit: Payer: Self-pay | Admitting: Family Medicine

## 2016-10-22 ENCOUNTER — Ambulatory Visit (INDEPENDENT_AMBULATORY_CARE_PROVIDER_SITE_OTHER): Payer: BLUE CROSS/BLUE SHIELD | Admitting: Family Medicine

## 2016-10-22 ENCOUNTER — Encounter: Payer: Self-pay | Admitting: Family Medicine

## 2016-10-22 VITALS — BP 102/78 | HR 74 | Temp 98.1°F | Resp 16 | Wt 123.8 lb

## 2016-10-22 DIAGNOSIS — I10 Essential (primary) hypertension: Secondary | ICD-10-CM | POA: Diagnosis not present

## 2016-10-22 MED ORDER — METOPROLOL SUCCINATE ER 25 MG PO TB24: 25.0000 mg | ORAL_TABLET | Freq: Every day | ORAL | 3 refills | Status: DC

## 2016-10-22 NOTE — Progress Notes (Signed)
61 yo very pleasant female here to follow up HTN.     HTN- Currently taking Metoprolol 25 mg XR daily.  Denies any HA, blurred vision, CP or LE edema.      Wt Readings from Last 3 Encounters:  10/22/16 123 lb 12.8 oz (56.2 kg)  07/27/16 127 lb 12.8 oz (58 kg)  07/17/16 125 lb (56.7 kg)   Lab Results  Component Value Date   CREATININE 0.98 10/03/2015   BP Readings from Last 3 Encounters:  10/22/16 102/78  07/27/16 126/74  07/17/16 120/70       Patient Active Problem List   Diagnosis Date Noted  . Rectal bleeding 07/27/2016  . Blood in stool 07/17/2016  . Anal fissure 09/07/2013  . Internal hemorrhoids 09/07/2013  . Generalized anxiety disorder 03/05/2013  . Hypothyroidism 04/24/2012  . HLD (hyperlipidemia) 04/24/2012  . INCONTINENCE, URGE 12/19/2009  . Allergic rhinitis 08/08/2009  . DEPRESSION, MILD 01/29/2007  . HYPERTENSION, BENIGN ESSENTIAL 01/29/2007   Past Medical History:  Diagnosis Date  . Allergic rhinitis   . Anal fissure   . Anxiety   . Depression   . Hemorrhoids   . HLD (hyperlipidemia)   . HTN (hypertension)   . Hypothyroidism   . Uterine fibroid    Past Surgical History:  Procedure Laterality Date  . ABDOMINAL HYSTERECTOMY  1995   partial, fibroids, Dr. Nori Riis  . COLONOSCOPY  2009  . FLEXIBLE SIGMOIDOSCOPY  2002  . URETHRAL DILATION  1988   Dr. Leory Plowman   Social History  Substance Use Topics  . Smoking status: Former Research scientist (life sciences)  . Smokeless tobacco: Never Used  . Alcohol use No   Family History  Problem Relation Age of Onset  . Hypertension Mother   . Hyperlipidemia Mother   . Alcohol abuse Father   . Cirrhosis Father   . Hypertension Sister   . Hypertension Maternal Aunt   . Lung cancer Paternal Aunt    No Known Allergies Current Outpatient Prescriptions on File Prior to Visit  Medication Sig Dispense Refill  . Calcium-Magnesium-Vitamin D (CALCIUM 500 PO) Take by mouth.    . cholecalciferol (VITAMIN D) 1000 UNITS tablet Take  5,000 Units by mouth daily.     Marland Kitchen diltiazem 2 % GEL Apply 1 application topically 2 (two) times daily. 30 g 2  . estradiol (ESTRACE) 1 MG tablet Take 1 mg by mouth daily.      . fluticasone (FLONASE) 50 MCG/ACT nasal spray Place 2 sprays into both nostrils daily. 16 g 6  . metoprolol succinate (TOPROL-XL) 25 MG 24 hr tablet TAKE 1 TABLET BY MOUTH EVERY DAY. 90 tablet 1  . Multiple Vitamin (MULTIVITAMIN) tablet Take 1 tablet by mouth daily.       No current facility-administered medications on file prior to visit.    The PMH, PSH, Social History, Family History, Medications, and allergies have been reviewed in [facility], and have been updated if relevant.   Review of Systems  Constitutional: Negative for fatigue.  HENT: Negative.   Eyes: Negative.   Respiratory: Negative.   Cardiovascular: Negative.  Negative for palpitations.  Gastrointestinal: Negative.   Endocrine: Negative.   Musculoskeletal: Negative.   Skin: Negative.   Allergic/Immunologic: Negative.   Neurological: Negative for dizziness, tremors, seizures, syncope, facial asymmetry, speech difficulty, weakness, light-headedness, numbness and headaches.  Hematological: Negative.   Psychiatric/Behavioral: Negative.   All other systems reviewed and are negative.     Physical Exam BP 102/78   Pulse 74   Temp  98.1 F (36.7 C) (Oral)   Resp 16   Wt 123 lb 12.8 oz (56.2 kg)   SpO2 97%   BMI 25.00 kg/m   BP Readings from Last 3 Encounters:  10/22/16 102/78  07/27/16 126/74  07/17/16 120/70    Wt Readings from Last 3 Encounters:  10/22/16 123 lb 12.8 oz (56.2 kg)  07/27/16 127 lb 12.8 oz (58 kg)  07/17/16 125 lb (56.7 kg)    General:  Well-developed,well-nourished,in no acute distress; alert,appropriate and cooperative throughout examination Head:  normocephalic and atraumatic.   Eyes:  vision grossly intact, PERRL Ears:  R ear normal and L ear normal externally, TMs clear bilaterally Nose:  no external deformity.    Mouth:  good dentition.   Neck:  No deformities, masses, or tenderness noted.   Lungs:  Normal respiratory effort, chest expands symmetrically. Lungs are clear to auscultation, no crackles or wheezes. Heart:  Normal rate and regular rhythm. S1 and S2 normal without gallop, murmur, click, rub or other extra sounds. Msk:  No deformity or scoliosis noted of thoracic or lumbar spine.   Extremities:  No clubbing, cyanosis, edema, or deformity noted with normal full range of motion of all joints.   Neurologic:  alert & oriented X3 and gait normal.   Skin:  Intact without suspicious lesions or rashes Psych:  Cognition and judgment appear intact. Alert and cooperative with normal attention span and concentration. No apparent delusions, illusions, hallucinations

## 2016-10-22 NOTE — Assessment & Plan Note (Signed)
Well controlled. No changes made. She will keep checking BP at home and looking for signs of hypotension. Currently denies any symptoms.

## 2016-11-05 DIAGNOSIS — Z1283 Encounter for screening for malignant neoplasm of skin: Secondary | ICD-10-CM | POA: Diagnosis not present

## 2016-11-05 DIAGNOSIS — Z85820 Personal history of malignant melanoma of skin: Secondary | ICD-10-CM | POA: Diagnosis not present

## 2016-11-05 DIAGNOSIS — Z85828 Personal history of other malignant neoplasm of skin: Secondary | ICD-10-CM | POA: Diagnosis not present

## 2016-11-05 DIAGNOSIS — D485 Neoplasm of uncertain behavior of skin: Secondary | ICD-10-CM | POA: Diagnosis not present

## 2017-03-22 ENCOUNTER — Ambulatory Visit (AMBULATORY_SURGERY_CENTER): Payer: Self-pay | Admitting: *Deleted

## 2017-03-22 ENCOUNTER — Encounter: Payer: Self-pay | Admitting: Gastroenterology

## 2017-03-22 ENCOUNTER — Other Ambulatory Visit: Payer: Self-pay

## 2017-03-22 VITALS — Ht 58.5 in | Wt 127.0 lb

## 2017-03-22 DIAGNOSIS — Z8371 Family history of colonic polyps: Secondary | ICD-10-CM

## 2017-03-22 DIAGNOSIS — Z1211 Encounter for screening for malignant neoplasm of colon: Secondary | ICD-10-CM

## 2017-03-22 MED ORDER — PEG 3350-KCL-NA BICARB-NACL 420 G PO SOLR: 4000.0000 mL | Freq: Once | ORAL | 0 refills | Status: AC

## 2017-03-22 NOTE — Progress Notes (Signed)
No egg or soy allergy known to patient  No issues with past sedation with any surgeries  or procedures, no intubation problems  No diet pills per patient No home 02 use per patient  No blood thinners per patient  Pt denies issues with constipation  Pt. States sometimes on and off.  Takes Miralax when it occurs.  Last episode was 6 months ago No A fib or A flutter  EMMI video sent to pt's e mail

## 2017-04-01 ENCOUNTER — Encounter: Payer: Self-pay | Admitting: Gastroenterology

## 2017-04-01 ENCOUNTER — Ambulatory Visit (AMBULATORY_SURGERY_CENTER): Payer: BLUE CROSS/BLUE SHIELD | Admitting: Gastroenterology

## 2017-04-01 VITALS — BP 130/85 | HR 58 | Temp 98.1°F | Resp 10 | Ht 59.0 in | Wt 123.0 lb

## 2017-04-01 DIAGNOSIS — D124 Benign neoplasm of descending colon: Secondary | ICD-10-CM

## 2017-04-01 DIAGNOSIS — K649 Unspecified hemorrhoids: Secondary | ICD-10-CM | POA: Diagnosis not present

## 2017-04-01 DIAGNOSIS — Z1211 Encounter for screening for malignant neoplasm of colon: Secondary | ICD-10-CM | POA: Diagnosis not present

## 2017-04-01 DIAGNOSIS — K635 Polyp of colon: Secondary | ICD-10-CM | POA: Diagnosis not present

## 2017-04-01 MED ORDER — SODIUM CHLORIDE 0.9 % IV SOLN: 500.0000 mL | Freq: Once | INTRAVENOUS | Status: DC

## 2017-04-01 NOTE — Patient Instructions (Signed)
YOU HAD AN ENDOSCOPIC PROCEDURE TODAY AT [facility]:   Refer to the procedure report that was given to you for any specific questions about what was found during the examination.  If the procedure report does not answer your questions, please call your gastroenterologist to clarify.  If you requested that your care partner not be given the details of your procedure findings, then the procedure report has been included in a sealed envelope for you to review at your convenience later.  YOU SHOULD EXPECT: Some feelings of bloating in the abdomen. Passage of more gas than usual.  Walking can help get rid of the air that was put into your GI tract during the procedure and reduce the bloating. If you had a lower endoscopy (such as a colonoscopy or flexible sigmoidoscopy) you may notice spotting of blood in your stool or on the toilet paper. If you underwent a bowel prep for your procedure, you may not have a normal bowel movement for a few days.  Please Note:  You might notice some irritation and congestion in your nose or some drainage.  This is from the oxygen used during your procedure.  There is no need for concern and it should clear up in a day or so.  SYMPTOMS TO REPORT IMMEDIATELY:   Following lower endoscopy (colonoscopy or flexible sigmoidoscopy):  Excessive amounts of blood in the stool  Significant tenderness or worsening of abdominal pains  Swelling of the abdomen that is new, acute  Fever of 100F or higher  Please see handouts given to you on polyps and Hemorrhoids. For urgent or emergent issues, a gastroenterologist can be reached at any hour by calling [phone].   DIET:  We do recommend a small meal at first, but then you may proceed to your regular diet.  Drink plenty of fluids but you should avoid alcoholic beverages for 24 hours.  ACTIVITY:  You should plan to take it easy for the rest of today and you should NOT DRIVE or use heavy machinery until tomorrow  (because of the sedation medicines used during the test).    FOLLOW UP: Our staff will call the number listed on your records the next business day following your procedure to check on you and address any questions or concerns that you may have regarding the information given to you following your procedure. If we do not reach you, we will leave a message.  However, if you are feeling well and you are not experiencing any problems, there is no need to return our call.  We will assume that you have returned to your regular daily activities without incident.  If any biopsies were taken you will be contacted by phone or by letter within the next 1-3 weeks.  Please call us at [phone] if you have not heard about the biopsies in 3 weeks.    SIGNATURES/CONFIDENTIALITY: You and/or your care partner have signed paperwork which will be entered into your electronic medical record.  These signatures attest to the fact that that the information above on your After Visit Summary has been reviewed and is understood.  Full responsibility of the confidentiality of this discharge information lies with you and/or your care-partner.   Thank you for letting us take care of your healthcare needs today.

## 2017-04-01 NOTE — Progress Notes (Signed)
Pt's states no medical or surgical changes since previsit or office visit. 

## 2017-04-01 NOTE — Progress Notes (Signed)
To recovery, report to RN, VSS. 

## 2017-04-01 NOTE — Progress Notes (Signed)
Called to room to assist during endoscopic procedure.  Patient ID and intended procedure confirmed with present staff. Received instructions for my participation in the procedure from the performing physician.  

## 2017-04-01 NOTE — Op Note (Signed)
[place] Patient Name: Terri Espinoza Procedure Date: 04/01/2017 8:47 AM MRN: [id number] Endoscopist: Milus Banister , MD Age: 62 Referring MD:  Date of Birth: 25-Dec-1955 Gender: Female Account #: [id number] Procedure:                Colonoscopy Indications:              Screening for colorectal malignant neoplasm Medicines:                Monitored Anesthesia Care Procedure:                Pre-Anesthesia Assessment:                           - Prior to the procedure, a History and Physical                            was performed, and patient medications and                            allergies were reviewed. The patient's tolerance of                            previous anesthesia was also reviewed. The risks                            and benefits of the procedure and the sedation                            options and risks were discussed with the patient.                            All questions were answered, and informed consent                            was obtained. Prior Anticoagulants: The patient has                            taken no previous anticoagulant or antiplatelet                            agents. ASA Grade Assessment: II - A patient with                            mild systemic disease. After reviewing the risks                            and benefits, the patient was deemed in                            satisfactory condition to undergo the procedure.                           After obtaining informed consent, the colonoscope  was passed under direct vision. Throughout the                            procedure, the patient's blood pressure, pulse, and                            oxygen saturations were monitored continuously. The                            Colonoscope was introduced through the anus and                            advanced to the the cecum, identified by                            appendiceal orifice and  ileocecal valve. The                            colonoscopy was performed without difficulty. The                            patient tolerated the procedure well. The quality                            of the bowel preparation was good. The ileocecal                            valve, appendiceal orifice, and rectum were                            photographed. Scope In: 8:50:56 AM Scope Out: 9:01:01 AM Scope Withdrawal Time: 0 hours 7 minutes 30 seconds  Total Procedure Duration: 0 hours 10 minutes 5 seconds  Findings:                 A 2 mm polyp was found in the descending colon. The                            polyp was sessile. The polyp was removed with a                            cold snare. Resection and retrieval were complete.                           External and internal hemorrhoids were found. The                            hemorrhoids were medium-sized.                           The exam was otherwise without abnormality on                            direct and retroflexion views. Complications:  No immediate complications. Estimated blood loss:                            None. Estimated Blood Loss:     Estimated blood loss: none. Impression:               - One 2 mm polyp in the descending colon, removed                            with a cold snare. Resected and retrieved.                           - External and internal hemorrhoids.                           - The examination was otherwise normal on direct                            and retroflexion views. Recommendation:           - Patient has a contact number available for                            emergencies. The signs and symptoms of potential                            delayed complications were discussed with the                            patient. Return to normal activities tomorrow.                            Written discharge instructions were provided to the                            patient.                            - Resume previous diet.                           - Continue present medications.                           You will receive a letter within 2-3 weeks with the                            pathology results and my final recommendations.                           If the polyp(s) is proven to be 'pre-cancerous' on                            pathology, you will need repeat colonoscopy in 5  years. If the polyp(s) is NOT 'precancerous' on                            pathology then you should repeat colon cancer                            screening in 10 years with colonoscopy without need                            for colon cancer screening by any method prior to                            then (including stool testing). Milus Banister, MD 04/01/2017 9:02:56 AM This report has been signed electronically.

## 2017-04-02 ENCOUNTER — Telehealth: Payer: Self-pay | Admitting: *Deleted

## 2017-04-02 NOTE — Telephone Encounter (Signed)
  Follow up Call-  Call back number 04/01/2017  Post procedure Call Back phone  # [phone]  Permission to leave phone message Yes  Some recent data might be hidden     Patient questions:  Do you have a fever, pain , or abdominal swelling? No. Pain Score  0 *  Have you tolerated food without any problems? Yes.    Have you been able to return to your normal activities? Yes.    Do you have any questions about your discharge instructions: Diet   No. Medications  No. Follow up visit  No.  Do you have questions or concerns about your Care? No.  Actions: * If pain score is 4 or above: No action needed, pain <4.

## 2017-04-05 ENCOUNTER — Encounter: Payer: Self-pay | Admitting: Gastroenterology

## 2017-05-02 DIAGNOSIS — Z1231 Encounter for screening mammogram for malignant neoplasm of breast: Secondary | ICD-10-CM | POA: Diagnosis not present

## 2017-05-02 DIAGNOSIS — Z6825 Body mass index (BMI) 25.0-25.9, adult: Secondary | ICD-10-CM | POA: Diagnosis not present

## 2017-05-02 DIAGNOSIS — Z01419 Encounter for gynecological examination (general) (routine) without abnormal findings: Secondary | ICD-10-CM | POA: Diagnosis not present

## 2017-05-02 LAB — HM PAP SMEAR

## 2017-05-02 LAB — HM MAMMOGRAPHY

## 2017-05-13 ENCOUNTER — Encounter: Payer: Self-pay | Admitting: Family Medicine

## 2017-05-13 ENCOUNTER — Ambulatory Visit: Payer: BLUE CROSS/BLUE SHIELD | Admitting: Family Medicine

## 2017-05-13 ENCOUNTER — Ambulatory Visit: Payer: Self-pay | Admitting: *Deleted

## 2017-05-13 VITALS — BP 140/90 | HR 81 | Temp 98.1°F | Wt 125.0 lb

## 2017-05-13 DIAGNOSIS — Z13220 Encounter for screening for lipoid disorders: Secondary | ICD-10-CM | POA: Diagnosis not present

## 2017-05-13 DIAGNOSIS — Z7689 Persons encountering health services in other specified circumstances: Secondary | ICD-10-CM

## 2017-05-13 DIAGNOSIS — I10 Essential (primary) hypertension: Secondary | ICD-10-CM | POA: Diagnosis not present

## 2017-05-13 DIAGNOSIS — R710 Precipitous drop in hematocrit: Secondary | ICD-10-CM

## 2017-05-13 DIAGNOSIS — Z1321 Encounter for screening for nutritional disorder: Secondary | ICD-10-CM | POA: Diagnosis not present

## 2017-05-13 MED ORDER — LISINOPRIL 10 MG PO TABS: 10.0000 mg | ORAL_TABLET | Freq: Every day | ORAL | 3 refills | Status: DC

## 2017-05-13 NOTE — Telephone Encounter (Signed)
Pt called with complaints of elevated BP; at her GYN office it was 160/90; today it was 156/100 taken today with automatic BP cuff at home; recommendations made per nurse triage ito include seeing a physician within 24 hours; pt last seen by Dr Terri Espinoza but she wants to remain a pt at [facility]; pt offered and accepted new pt appointment with Terri Espinoza at 1430; will route to office for notification of this upcoming visit.   Reason for Disposition . Systolic BP  >= 211 OR Diastolic >= 941  Answer Assessment - Initial Assessment Questions 1. BLOOD PRESSURE: "What is the blood pressure?" "Did you take at least two measurements 5 minutes apart?"     156/100 yes 2. ONSET: "When did you take your blood pressure?"     05/13/17 at 0810 3. HOW: "How did you obtain the blood pressure?" (e.g., visiting nurse, automatic home BP monitor)     Automatic cuff on left arm 4. HISTORY: "Do you have a history of high blood pressure?"     yes 5. MEDICATIONS: "Are you taking any medications for blood pressure?" "Have you missed any doses recently?"     Yes, no doses missed 6. OTHER SYMPTOMS: "Do you have any symptoms?" (e.g., headache, chest pain, blurred vision, difficulty breathing, weakness)     no 7. PREGNANCY: "Is there any chance you are pregnant?" "When was your last menstrual period?"     No hysterectomy  Protocols used: HIGH BLOOD PRESSURE-A-AH

## 2017-05-13 NOTE — Telephone Encounter (Addendum)
Pt has 30' appt 05/13/17 at 2:30 with Glenda Chroman FNP. Pt has appt 06/10/17 to est care with Glenda Chroman FNP; former Dr Deborra Medina pt.

## 2017-05-13 NOTE — Patient Instructions (Signed)
It was a pleasure to meet you today! I look forward to partnering with you for your health care needs  Take 1/2 tablet of metoprolol for 2 weeks Start lisinopril tomorrow Return for fasting blood work in 3 weeks (lab only appointment) Return for office visit follow up in 4 weeks Let me know if blood pressure greater than 150/100 more than half the time   How to Take Your Blood Pressure You can take your blood pressure at home with a machine. You may need to check your blood pressure at home:  To check if you have high blood pressure (hypertension).  To check your blood pressure over time.  To make sure your blood pressure medicine is working.  Supplies needed: You will need a blood pressure machine, or monitor. You can buy one at a drugstore or online. When choosing one:  Choose one with an arm cuff.  Choose one that wraps around your upper arm. Only one finger should fit between your arm and the cuff.  Do not choose one that measures your blood pressure from your wrist or finger.  Your doctor can suggest a monitor. How to prepare Avoid these things for 30 minutes before checking your blood pressure:  Drinking caffeine.  Drinking alcohol.  Eating.  Smoking.  Exercising.  Five minutes before checking your blood pressure:  Pee.  Sit in a dining chair. Avoid sitting in a soft couch or armchair.  Be quiet. Do not talk.  How to take your blood pressure Follow the instructions that came with your machine. If you have a digital blood pressure monitor, these may be the instructions: 1. Sit up straight. 2. Place your feet on the floor. Do not cross your ankles or legs. 3. Rest your left arm at the level of your heart. You may rest it on a table, desk, or chair. 4. Pull up your shirt sleeve. 5. Wrap the blood pressure cuff around the upper part of your left arm. The cuff should be 1 inch (2.5 cm) above your elbow. It is best to wrap the cuff around bare skin. 6. Fit the  cuff snugly around your arm. You should be able to place only one finger between the cuff and your arm. 7. Put the cord inside the groove of your elbow. 8. Press the power button. 9. Sit quietly while the cuff fills with air and loses air. 10. Write down the numbers on the screen. 11. Wait 2-3 minutes and then repeat steps 1-10.  What do the numbers mean? Two numbers make up your blood pressure. The first number is called systolic pressure. The second is called diastolic pressure. An example of a blood pressure reading is "120 over 80" (or 120/80). If you are an adult and do not have a medical condition, use this guide to find out if your blood pressure is normal: Normal  First number: below 120.  Second number: below 80. Elevated  First number: 120-129.  Second number: below 80. Hypertension stage 1  First number: 130-139.  Second number: 80-89. Hypertension stage 2  First number: 140 or above.  Second number: 75 or above. Your blood pressure is above normal even if only the top or bottom number is above normal. Follow these instructions at home:  Check your blood pressure as often as your doctor tells you to.  Take your monitor to your next doctor's appointment. Your doctor will: ? Make sure you are using it correctly. ? Make sure it is working right.  Make sure you understand what your blood pressure numbers should be.  Tell your doctor if your medicines are causing side effects. Contact a doctor if:  Your blood pressure keeps being high. Get help right away if:  Your first blood pressure number is higher than 180.  Your second blood pressure number is higher than 120. This information is not intended to replace advice given to you by your health care provider. Make sure you discuss any questions you have with your health care provider. Document Released: 01/12/2008 Document Revised: 12/28/2015 Document Reviewed: 07/08/2015 Elsevier Interactive Patient Education   [employer].

## 2017-05-13 NOTE — Telephone Encounter (Signed)
Spoke with Rena regarding pt appointment; she states that the pt can be sen for the acute visit today but will need to be seen at another time for transfer of care appointment; contacted pt regarding this scheduling issue; pt offered and accepted appointment with Clarene Reamer today at 1430 for elevated BP, and transfer of care appointment on 06/10/17 at 1530; she verbalizes understanding; will route to office for notification of this upcoming appointment.

## 2017-05-13 NOTE — Progress Notes (Signed)
Subjective:    Patient ID: Terri Espinoza, female    DOB: May 27, 1955, 62 y.o.   MRN: [id number]  HPI This is a 62 yo female who presents today with elevated blood pressure. Was seen at gyn office 05/02/17 and blood pressure was elevated 165/90 (she thinks). She started checking at home and range has been 140-150/90-100. Her cuff is old and she is not sure how accurate it is. Has been on metoprolol 25 mg for many years, occasionally has been increased to 50 mg then decreased as her blood pressure came down.   Was told her hemoglobin was low at gyn (finger stick). Just had screening colonoscopy 2/19, she reports no cancerous polyps.   Last CPE- gyn 05/02/17 Mammo- 05/02/17 Pap- NA Colonoscopy- 04/01/17 Td- 2010 Exercise- walks her dog Sleep- no problems    Past Medical History:  Diagnosis Date  . Allergic rhinitis   . Allergy   . Anal fissure   . Anxiety   . Arthritis   . Cancer ([place])    melanoma on back and upper abdomen  BCC also multiple  . Hemorrhoids   . HLD (hyperlipidemia)   . HTN (hypertension)   . Hypothyroidism    pt. denies  . Osteopenia   . Uterine fibroid    Past Surgical History:  Procedure Laterality Date  . ABDOMINAL HYSTERECTOMY  1995   partial, fibroids, Dr. Nori Riis  . COLONOSCOPY  2009  . FLEXIBLE SIGMOIDOSCOPY  2002  . skin cancer removal     melanoma upper abdomen/back   BCC multiple   . URETHRAL DILATION  1988   Dr. Leory Plowman   Family History  Problem Relation Age of Onset  . Hypertension Mother   . Hyperlipidemia Mother   . Alcohol abuse Father   . Cirrhosis Father   . Colon polyps Father   . Colon polyps Sister   . Hypertension Sister   . Hypertension Maternal Aunt   . Lung cancer Paternal Aunt   . Colon cancer Neg Hx   . Esophageal cancer Neg Hx   . Stomach cancer Neg Hx   . Rectal cancer Neg Hx      Review of Systems No chest pain, no SOB, no pedal edema. Intermittent headache over eyes "sinus."     Objective:   Physical  Exam Physical Exam  Constitutional: Oriented to person, place, and time. She appears well-developed and well-nourished.  HENT:  Head: Normocephalic and atraumatic.  Eyes: Conjunctivae are normal.  Cardiovascular: Normal rate, regular rhythm and normal heart sounds.   Pulmonary/Chest: Effort normal and breath sounds normal.  Musculoskeletal: Normal range of motion. No edema.  Neurological: Alert and oriented to person, place, and time.  Skin: Skin is warm and dry.  Psychiatric: Normal mood and affect. Behavior is normal. Judgment and thought content normal.  Vitals reviewed.     BP 140/90   Pulse 81   Temp 98.1 F (36.7 C) (Oral)   Wt 125 lb (56.7 kg)   SpO2 95%   BMI 25.25 kg/m  Wt Readings from Last 3 Encounters:  05/13/17 125 lb (56.7 kg)  04/01/17 123 lb (55.8 kg)  03/22/17 127 lb (57.6 kg)   BP Readings from Last 3 Encounters:  05/13/17 140/90  04/01/17 130/85  10/22/16 102/78       Assessment & Plan:  1. Essential hypertension - will wean metoprolol and add lisinopril.  - lab only visit in 3 weeks, follow up in 4 weeks, she was given information regarding  checking blood pressure 2x/week and parameters to call/email office - lisinopril (PRINIVIL,ZESTRIL) 10 MG tablet; Take 1 tablet (10 mg total) by mouth daily.  Dispense: 90 tablet; Refill: 3 - Basic Metabolic Panel; Future - Lipid Panel; Future  2. Decreased hemoglobin - requested records from gyn - CBC with Differential; Future - Ferritin; Future  3. Encounter for vitamin deficiency screening - Vitamin D, 25-hydroxy; Future  4. Screening for lipid disorders - Lipid Panel; Future   Clarene Reamer, FNP-BC  [facility]  05/13/2017 2:57 PM

## 2017-05-14 ENCOUNTER — Encounter: Payer: Self-pay | Admitting: Family Medicine

## 2017-05-14 LAB — LIPID PANEL
Cholesterol: 232 — AB (ref 0–200)
HDL: 100 — AB (ref 35–70)
Triglycerides: 141 (ref 40–160)

## 2017-05-14 LAB — BASIC METABOLIC PANEL: Glucose: 85

## 2017-05-23 ENCOUNTER — Encounter: Payer: Self-pay | Admitting: Family Medicine

## 2017-05-27 ENCOUNTER — Other Ambulatory Visit (INDEPENDENT_AMBULATORY_CARE_PROVIDER_SITE_OTHER): Payer: BLUE CROSS/BLUE SHIELD

## 2017-05-27 DIAGNOSIS — R710 Precipitous drop in hematocrit: Secondary | ICD-10-CM

## 2017-05-27 DIAGNOSIS — Z13220 Encounter for screening for lipoid disorders: Secondary | ICD-10-CM

## 2017-05-27 DIAGNOSIS — Z1321 Encounter for screening for nutritional disorder: Secondary | ICD-10-CM | POA: Diagnosis not present

## 2017-05-27 DIAGNOSIS — I10 Essential (primary) hypertension: Secondary | ICD-10-CM

## 2017-05-27 LAB — CBC WITH DIFFERENTIAL/PLATELET
BASOS ABS: 0.1 10*3/uL (ref 0.0–0.1)
BASOS PCT: 0.9 % (ref 0.0–3.0)
EOS ABS: 0.1 10*3/uL (ref 0.0–0.7)
Eosinophils Relative: 1.6 % (ref 0.0–5.0)
HEMATOCRIT: 37.3 % (ref 36.0–46.0)
Hemoglobin: 12.8 g/dL (ref 12.0–15.0)
LYMPHS ABS: 1.2 10*3/uL (ref 0.7–4.0)
Lymphocytes Relative: 20.1 % (ref 12.0–46.0)
MCHC: 34.3 g/dL (ref 30.0–36.0)
MCV: 92.1 fl (ref 78.0–100.0)
MONO ABS: 0.6 10*3/uL (ref 0.1–1.0)
Monocytes Relative: 9.4 % (ref 3.0–12.0)
NEUTROS ABS: 4 10*3/uL (ref 1.4–7.7)
NEUTROS PCT: 68 % (ref 43.0–77.0)
PLATELETS: 175 10*3/uL (ref 150.0–400.0)
RBC: 4.05 Mil/uL (ref 3.87–5.11)
RDW: 12.6 % (ref 11.5–15.5)
WBC: 5.9 10*3/uL (ref 4.0–10.5)

## 2017-05-27 LAB — BASIC METABOLIC PANEL
BUN: 15 mg/dL (ref 6–23)
CALCIUM: 8.5 mg/dL (ref 8.4–10.5)
CHLORIDE: 105 meq/L (ref 96–112)
CO2: 27 mEq/L (ref 19–32)
CREATININE: 0.9 mg/dL (ref 0.40–1.20)
GFR: 67.47 mL/min (ref >60.00)
GLUCOSE: 83 mg/dL (ref 70–99)
Potassium: 4.4 mEq/L (ref 3.5–5.1)
Sodium: 139 mEq/L (ref 135–145)

## 2017-05-27 LAB — LIPID PANEL
Cholesterol: 215 mg/dL — ABNORMAL HIGH (ref 0–200)
HDL: 63.1 mg/dL (ref >39.00)
LDL Cholesterol: 125 mg/dL — ABNORMAL HIGH (ref 0–99)
NONHDL: 151.44
TRIGLYCERIDES: 133 mg/dL (ref 0.0–149.0)
Total CHOL/HDL Ratio: 3
VLDL: 26.6 mg/dL (ref 0.0–40.0)

## 2017-05-27 LAB — VITAMIN D 25 HYDROXY (VIT D DEFICIENCY, FRACTURES): VITD: 73.95 ng/mL (ref 30.00–100.00)

## 2017-05-27 LAB — FERRITIN: Ferritin: 48.5 ng/mL (ref 10.0–291.0)

## 2017-06-10 ENCOUNTER — Ambulatory Visit: Payer: BLUE CROSS/BLUE SHIELD | Admitting: Family Medicine

## 2017-06-14 ENCOUNTER — Encounter: Payer: Self-pay | Admitting: Family Medicine

## 2017-06-17 ENCOUNTER — Encounter: Payer: Self-pay | Admitting: Family Medicine

## 2017-06-17 ENCOUNTER — Ambulatory Visit: Payer: BLUE CROSS/BLUE SHIELD | Admitting: Family Medicine

## 2017-06-17 VITALS — BP 98/60 | HR 86 | Temp 98.0°F | Ht 59.0 in | Wt 122.0 lb

## 2017-06-17 DIAGNOSIS — I10 Essential (primary) hypertension: Secondary | ICD-10-CM

## 2017-06-17 NOTE — Progress Notes (Signed)
Subjective:    Patient ID: Terri Espinoza, female    DOB: 1955/02/28, 62 y.o.   MRN: [id number]  HPI This is a 62 yo female who presents today for follow up of elevated BP. Was seen last month and metoprolol was slowly weaned and she was started on lisinopril 10 mg. Has been taking blood pressure at home and running 95-105/ 70-82. Was feeling tired and had some dizziness with position changes which has resolved. No syncope. No chest pain or SOB. Poor water drinker.  BMET 05/27/17- WNL  Past Medical History:  Diagnosis Date  . Allergic rhinitis   . Allergy   . Anal fissure   . Anxiety   . Arthritis   . Cancer ([place])    melanoma on back and upper abdomen  BCC also multiple  . Hemorrhoids   . HLD (hyperlipidemia)   . HTN (hypertension)   . Hypothyroidism    pt. denies  . Osteopenia   . Uterine fibroid    Past Surgical History:  Procedure Laterality Date  . ABDOMINAL HYSTERECTOMY  1995   partial, fibroids, Dr. Nori Riis  . COLONOSCOPY  2009  . FLEXIBLE SIGMOIDOSCOPY  2002  . skin cancer removal     melanoma upper abdomen/back   BCC multiple   . URETHRAL DILATION  1988   Dr. Leory Plowman   Family History  Problem Relation Age of Onset  . Hypertension Mother   . Hyperlipidemia Mother   . Alcohol abuse Father   . Cirrhosis Father   . Colon polyps Father   . Colon polyps Sister   . Hypertension Sister   . Hypertension Maternal Aunt   . Lung cancer Paternal Aunt   . Colon cancer Neg Hx   . Esophageal cancer Neg Hx   . Stomach cancer Neg Hx   . Rectal cancer Neg Hx    Social History   Tobacco Use  . Smoking status: Former Research scientist (life sciences)  . Smokeless tobacco: Never Used  . Tobacco comment: quit 25 years ago  Substance Use Topics  . Alcohol use: No  . Drug use: No      Review of Systems Per HPI    Objective:   Physical Exam Physical Exam  Constitutional: Oriented to person, place, and time. She appears well-developed and well-nourished.  HENT:  Head: Normocephalic and  atraumatic.  Eyes: Conjunctivae are normal.  Neck: Normal range of motion. Neck supple.  Cardiovascular: Normal rate, regular rhythm and normal heart sounds.   Pulmonary/Chest: Effort normal and breath sounds normal.  Musculoskeletal: Normal range of motion.  Neurological: Alert and oriented to person, place, and time.  Skin: Skin is warm and dry.  Psychiatric: Normal mood and affect. Behavior is normal. Judgment and thought content normal.  Vitals reviewed.     BP 98/60 (BP Location: Right Arm, Patient Position: Sitting, Cuff Size: Normal)   Pulse 86   Temp 98 F (36.7 C) (Oral)   Ht 4\' 11"  (1.499 m)   Wt 122 lb (55.3 kg)   SpO2 96%   BMI 24.64 kg/m  BP Readings from Last 3 Encounters:  06/17/17 98/60  05/13/17 140/90  04/01/17 130/85       Assessment & Plan:  1. Essential hypertension -  Patient Instructions  Please decrease your lisinopril to 1/2 tablet daily.   Keep an eye on your blood pressures and let me know if they are consistently running less than 100 for the top number or over 140/90.   Try to drink  more liquids. If dizziness doesn't go away, please let me know.   Follow up in 6 months      Clarene Reamer, FNP-BC  [facility] [place]  06/23/2017 8:44 PM

## 2017-06-17 NOTE — Patient Instructions (Addendum)
Please decrease your lisinopril to 1/2 tablet daily.   Keep an eye on your blood pressures and let me know if they are consistently running less than 100 for the top number or over 140/90.   Try to drink more liquids. If dizziness doesn't go away, please let me know.   Follow up in 6 months

## 2017-06-23 ENCOUNTER — Encounter: Payer: Self-pay | Admitting: Family Medicine

## 2017-10-02 ENCOUNTER — Encounter: Payer: Self-pay | Admitting: Nurse Practitioner

## 2017-10-02 ENCOUNTER — Ambulatory Visit: Payer: BLUE CROSS/BLUE SHIELD | Admitting: Nurse Practitioner

## 2017-10-02 VITALS — BP 122/70 | HR 66 | Ht 58.5 in | Wt 123.0 lb

## 2017-10-02 DIAGNOSIS — K5909 Other constipation: Secondary | ICD-10-CM | POA: Diagnosis not present

## 2017-10-02 DIAGNOSIS — K648 Other hemorrhoids: Secondary | ICD-10-CM | POA: Diagnosis not present

## 2017-10-02 NOTE — Progress Notes (Signed)
[facility]:  Oretha Caprice, MD      Chief Complaint:  hemorrhoids     IMPRESSION and PLAN:    #1. 62 yo female with internal hemorrhoids / related intermittent rectal bleeding interested in hemorrhoid banding.  -we discussed hemorrhoid banding. I will schedule her to have this done with Dr. [name].   #2. Constipation. Stool soft, mainly problem is with expelling the stool.  -trial of glycerin supp as needed.    HPI:     Patient is a 62 year old female with history of constipation, anal fissures and hemorrhoids.  Her last colonoscopy was February 2019 with findings of a 2 mm sessile descending colon polyp which was removed, medium internal and external hemorrhoids. Polyp was hyperplastic.  Terri Espinoza comes in to discuss having hemorrhoids banding.  She has intermittent rectal bleeding and perianal itching from hemorrhoids.  She takes a stool softener every day and stools are soft but infrequent. However, she recently started a probiotic called Good Belly and now having a bowel movement nearly every day.  Her main complaint is difficulty expelling stool even though they are soft in consistency. She is also tired of having the intermittent rectal bleeding.    Review of systems:     No chest pain, no SOB, no fevers, no urinary sx   Past Medical History:  Diagnosis Date  . Allergic rhinitis   . Allergy   . Anal fissure   . Anxiety   . Arthritis   . Cancer ([place])    melanoma on back and upper abdomen  BCC also multiple  . Hemorrhoids   . HLD (hyperlipidemia)   . HTN (hypertension)   . Hypothyroidism    pt. denies  . Osteopenia   . Uterine fibroid     Patient's surgical history, family medical history, social history, medications and allergies were all reviewed in Epic   Creatinine clearance cannot be calculated (Patient's most recent lab result is older than the maximum 21 days allowed.)  Current Outpatient Medications  Medication Sig Dispense Refill  .  Calcium-Magnesium-Vitamin D (CALCIUM 500 PO) Take by mouth.    . cholecalciferol (VITAMIN D) 1000 UNITS tablet Take 5,000 Units by mouth daily.     Marland Kitchen estradiol (ESTRACE) 1 MG tablet Take 1 mg by mouth daily.      . fluticasone (FLONASE) 50 MCG/ACT nasal spray Place 2 sprays into both nostrils daily. 16 g 6  . lisinopril (PRINIVIL,ZESTRIL) 10 MG tablet Take 1 tablet (10 mg total) by mouth daily. 90 tablet 3  . Multiple Vitamin (MULTIVITAMIN) tablet Take 1 tablet by mouth daily.       Current Facility-Administered Medications  Medication Dose Route Frequency Provider Last Rate Last Dose  . 0.9 %  sodium chloride infusion  500 mL Intravenous Once Milus Banister, MD        Physical Exam:     BP 122/70   Pulse 66   Ht 4' 10.5" (1.486 m)   Wt 123 lb (55.8 kg)   BMI 25.27 kg/m   GENERAL:  Pleasant female in NAD PSYCH: : Cooperative, normal affect EENT:  conjunctiva pink, mucous membranes moist, neck supple without masses CARDIAC:  RRR, no murmur heard, no peripheral edema PULM: Normal respiratory effort, lungs CTA bilaterally, no wheezing ABDOMEN:  Nondistended, soft, nontender. No obvious masses, no hepatomegaly,  normal bowel sounds RECTAL:  On anoscopy there were inflamed hemorrhoids  SKIN:  turgor, no lesions seen Musculoskeletal:  Normal muscle tone, normal strength  NEURO: Alert and oriented x 3, no focal neurologic deficits   Tye Savoy , NP 10/02/2017, 11:41 AM]

## 2017-10-02 NOTE — Patient Instructions (Addendum)
If you are age 62 or older, your body mass index should be between 23-30. Your Body mass index is 25.27 kg/m. If this is out of the aforementioned range listed, please consider follow up with your Primary Care Provider.  If you are age 78 or younger, your body mass index should be between 19-25. Your Body mass index is 25.27 kg/m. If this is out of the aformentioned range listed, please consider follow up with your Primary Care Provider.   You have been scheduled for a hemorrhoid banding on 10/03/17 at  3pm with Dr. Darci Current.  Use Glycerin suppositories as needed.  Thank you for choosing me and [facility].   Tye Savoy, NP

## 2017-10-03 ENCOUNTER — Encounter: Payer: Self-pay | Admitting: Internal Medicine

## 2017-10-03 ENCOUNTER — Ambulatory Visit: Payer: BLUE CROSS/BLUE SHIELD | Admitting: Internal Medicine

## 2017-10-03 VITALS — BP 100/60 | HR 80 | Ht 58.5 in | Wt 123.8 lb

## 2017-10-03 DIAGNOSIS — R198 Other specified symptoms and signs involving the digestive system and abdomen: Secondary | ICD-10-CM

## 2017-10-03 DIAGNOSIS — K641 Second degree hemorrhoids: Secondary | ICD-10-CM | POA: Diagnosis not present

## 2017-10-03 NOTE — Patient Instructions (Addendum)
  HEMORRHOID BANDING PROCEDURE    FOLLOW-UP CARE   1. The procedure you have had should have been relatively painless since the banding of the area involved does not have nerve endings and there is no pain sensation.  The rubber band cuts off the blood supply to the hemorrhoid and the band may fall off as soon as 48 hours after the banding (the band may occasionally be seen in the toilet bowl following a bowel movement). You may notice a temporary feeling of fullness in the rectum which should respond adequately to plain Tylenol or Motrin.  2. Following the banding, avoid strenuous exercise that evening and resume full activity the next day.  A sitz bath (soaking in a warm tub) or bidet is soothing, and can be useful for cleansing the area after bowel movements.     3. To avoid constipation, take two tablespoons of natural wheat bran, natural oat bran, flax, Benefiber or any over the counter fiber supplement and increase your water intake to 7-8 glasses daily.    4. Unless you have been prescribed anorectal medication, do not put anything inside your rectum for two weeks: No suppositories, enemas, fingers, etc.  5. Occasionally, you may have more bleeding than usual after the banding procedure.  This is often from the untreated hemorrhoids rather than the treated one.  Don't be concerned if there is a tablespoon or so of blood.  If there is more blood than this, lie flat with your bottom higher than your head and apply an ice pack to the area. If the bleeding does not stop within a half an hour or if you feel faint, call our office at [phone] or go to the emergency room.  6. Problems are not common; however, if there is a substantial amount of bleeding, severe pain, chills, fever or difficulty passing urine (very rare) or other problems, you should call us at [phone] or report to the nearest emergency room.  7. Do not stay seated continuously for more than 2-3 hours for a day or  two after the procedure.  Tighten your buttock muscles 10-15 times every two hours and take 10-15 deep breaths every 1-2 hours.  Do not spend more than a few minutes on the toilet if you cannot empty your bowel; instead re-visit the toilet at a later time.    Please use 2 tablespoons of benefiber daily.   We will see you back for more banding on 11/07/17 at 11:15AM    I appreciate the opportunity to care for you. Silvano Rusk, MD, [facility]

## 2017-10-03 NOTE — Progress Notes (Signed)
   Hemorrhoidal banding appointment  Symptoms are itching anal irritation rectal bleeding rarely, straining to stool.  She does have a history of anal fissure in the past and she had a colonoscopy showing internal/external hemorrhoids earlier this year, Dr. Ardis Hughs.  Rectal exam with Harless Nakayama, CMA present demonstrates normal anoderm absent anal wink no mass or rectocele normal resting anal tone.  There is an appropriate voluntary anal squeeze.  There is an appropriate abdominal contraction relaxation and descent with simulated defecation.  Estill Bamberg still present for a anoscopy exam which demonstrates grade 2 internal hemorrhoids with the most inflamed one in the right posterior position with small inflamed external components on each.  PROCEDURE NOTE: The patient presents with symptomatic grade 2  hemorrhoids, requesting rubber band ligation of his/her hemorrhoidal disease.  All risks, benefits and alternative forms of therapy were described and informed consent was obtained.   The anorectum was pre-medicated with 0.125% nitroglycerin and 5% lidocaine The decision was made to band the right posterior internal hemorrhoid, and the [place] was used to perform band ligation without complication.  Digital anorectal examination was then performed to assure proper positioning of the band, and to adjust the banded tissue as required.  The patient was discharged home without pain or other issues.  Dietary and behavioral recommendations were given and along with follow-up instructions.     The following adjunctive treatments were recommended:  2 tablespoons of Benefiber daily and increased fluid intake.  Minimize time on toilet.  The patient will return in a few weeks for  follow-up and possible additional banding as required.  If this banding of the most prominent hemorrhoid takes care of things may not need further banding. No complications were encountered and the patient  tolerated the procedure well.  I appreciate the opportunity to care for this patient.

## 2017-10-03 NOTE — Assessment & Plan Note (Signed)
Right posterior banded today.  Return in September for follow-up and additional banding possibly.  Benefiber 2 tablespoons daily reduced toileting time.

## 2017-10-04 ENCOUNTER — Encounter: Payer: Self-pay | Admitting: Nurse Practitioner

## 2017-10-05 NOTE — Progress Notes (Signed)
I agree with the above note, plan 

## 2017-11-07 ENCOUNTER — Ambulatory Visit: Payer: BLUE CROSS/BLUE SHIELD | Admitting: Internal Medicine

## 2017-11-07 ENCOUNTER — Encounter: Payer: Self-pay | Admitting: Internal Medicine

## 2017-11-07 DIAGNOSIS — K641 Second degree hemorrhoids: Secondary | ICD-10-CM | POA: Diagnosis not present

## 2017-11-07 NOTE — Patient Instructions (Signed)
  HEMORRHOID BANDING PROCEDURE    FOLLOW-UP CARE   1. The procedure you have had should have been relatively painless since the banding of the area involved does not have nerve endings and there is no pain sensation.  The rubber band cuts off the blood supply to the hemorrhoid and the band may fall off as soon as 48 hours after the banding (the band may occasionally be seen in the toilet bowl following a bowel movement). You may notice a temporary feeling of fullness in the rectum which should respond adequately to plain Tylenol or Motrin.  2. Following the banding, avoid strenuous exercise that evening and resume full activity the next day.  A sitz bath (soaking in a warm tub) or bidet is soothing, and can be useful for cleansing the area after bowel movements.     3. To avoid constipation, take two tablespoons of natural wheat bran, natural oat bran, flax, Benefiber or any over the counter fiber supplement and increase your water intake to 7-8 glasses daily.    4. Unless you have been prescribed anorectal medication, do not put anything inside your rectum for two weeks: No suppositories, enemas, fingers, etc.  5. Occasionally, you may have more bleeding than usual after the banding procedure.  This is often from the untreated hemorrhoids rather than the treated one.  Don't be concerned if there is a tablespoon or so of blood.  If there is more blood than this, lie flat with your bottom higher than your head and apply an ice pack to the area. If the bleeding does not stop within a half an hour or if you feel faint, call our office at [phone] or go to the emergency room.  6. Problems are not common; however, if there is a substantial amount of bleeding, severe pain, chills, fever or difficulty passing urine (very rare) or other problems, you should call us at [phone] or report to the nearest emergency room.  7. Do not stay seated continuously for more than 2-3 hours for a day or  two after the procedure.  Tighten your buttock muscles 10-15 times every two hours and take 10-15 deep breaths every 1-2 hours.  Do not spend more than a few minutes on the toilet if you cannot empty your bowel; instead re-visit the toilet at a later time.   Please continue using the Benefiber.  Follow up with Dr [name] as needed.    I appreciate the opportunity to care for you. Silvano Rusk, MD, [facility]

## 2017-11-07 NOTE — Progress Notes (Signed)
   HEMORRHOID LIGATION  Signs/Sxs: anal itching, irritation, rectal bleeding Colonoscopy 03/2017 Ardis Hughs - hemorrhoids, 2 mm hyperplastic polyp  RP banded 10/03/17 - had about 24 hrs lower abdominal discomfort after that Sxs are better but persist  PROCEDURE NOTE: The patient presents with symptomatic grade 2  hemorrhoids, requesting rubber band ligation of his/her hemorrhoidal disease.  All risks, benefits and alternative forms of therapy were described and informed consent was obtained.   The anorectum was pre-medicated with 0.125% NTG and 5% lidocaine The decision was made to band the LL and RA internal hemorrhoids, and the [place] was used to perform band ligation without complication.  Digital anorectal examination was then performed to assure proper positioning of the band, and to adjust the banded tissue as required.  The patient was discharged home without pain or other issues.  Dietary and behavioral recommendations were given and along with follow-up instructions.     The following adjunctive treatments were recommended:  Continue Benefiber The patient will return prn for  follow-up and possible additional banding as required. No complications were encountered and the patient tolerated the procedure well.   I appreciate the opportunity to care for her.  [id number], Dalbert Batman, FNP Owens Loffler, MD

## 2017-11-10 NOTE — Assessment & Plan Note (Signed)
RA, LL banded F/u PRN Use Benefiber

## 2017-11-27 DIAGNOSIS — Z85828 Personal history of other malignant neoplasm of skin: Secondary | ICD-10-CM | POA: Diagnosis not present

## 2017-11-27 DIAGNOSIS — L570 Actinic keratosis: Secondary | ICD-10-CM | POA: Diagnosis not present

## 2017-11-27 DIAGNOSIS — Z1283 Encounter for screening for malignant neoplasm of skin: Secondary | ICD-10-CM | POA: Diagnosis not present

## 2017-11-27 DIAGNOSIS — Z85820 Personal history of malignant melanoma of skin: Secondary | ICD-10-CM | POA: Diagnosis not present

## 2017-11-27 DIAGNOSIS — D485 Neoplasm of uncertain behavior of skin: Secondary | ICD-10-CM | POA: Diagnosis not present

## 2017-12-23 ENCOUNTER — Ambulatory Visit: Payer: BLUE CROSS/BLUE SHIELD | Admitting: Family Medicine

## 2017-12-23 ENCOUNTER — Encounter: Payer: Self-pay | Admitting: Family Medicine

## 2017-12-23 VITALS — BP 102/64 | HR 73 | Temp 97.9°F | Ht <= 58 in | Wt 125.0 lb

## 2017-12-23 DIAGNOSIS — I10 Essential (primary) hypertension: Secondary | ICD-10-CM | POA: Diagnosis not present

## 2017-12-23 NOTE — Patient Instructions (Signed)
Good to see you today  Follow up in 6 months 

## 2017-12-23 NOTE — Progress Notes (Signed)
   Subjective:    Patient ID: Terri Espinoza, female    DOB: [birth year], 62 y.o.   MRN: [id number]  HPI This is a 62 yo female who presents today for follow up of HTN. Currently taking lisinopril 5 mg po qd. No further dizziness. No chest pain or SOB, no LE edema.   Past Medical History:  Diagnosis Date  . Allergic rhinitis   . Allergy   . Anal fissure   . Anxiety   . Arthritis   . Cancer ([place])    melanoma on back and upper abdomen  BCC also multiple  . Hemorrhoids   . HLD (hyperlipidemia)   . HTN (hypertension)   . Hypothyroidism    pt. denies  . Osteopenia   . Uterine fibroid    Past Surgical History:  Procedure Laterality Date  . ABDOMINAL HYSTERECTOMY  1995   partial, fibroids, Dr. Nori Riis  . COLONOSCOPY  2009  . FLEXIBLE SIGMOIDOSCOPY  2002  . HEMORRHOID BANDING    . skin cancer removal     melanoma upper abdomen/back   BCC multiple   . URETHRAL DILATION  1988   Dr. Leory Plowman   Family History  Problem Relation Age of Onset  . Hypertension Mother   . Hyperlipidemia Mother   . Alcohol abuse Father   . Cirrhosis Father   . Colon polyps Father   . Colon polyps Sister   . Hypertension Sister   . Hypertension Maternal Aunt   . Lung cancer Paternal Aunt   . Colon cancer Neg Hx   . Esophageal cancer Neg Hx   . Stomach cancer Neg Hx   . Rectal cancer Neg Hx    Social History   Tobacco Use  . Smoking status: Former Research scientist (life sciences)  . Smokeless tobacco: Never Used  . Tobacco comment: quit 25 years ago  Substance Use Topics  . Alcohol use: No  . Drug use: No      Review of Systems Per HPI    Objective:   Physical Exam Physical Exam  Constitutional: Oriented to person, place, and time. She appears well-developed and well-nourished.  HENT:  Head: Normocephalic and atraumatic.  Eyes: Conjunctivae are normal.  Neck: Normal range of motion. Neck supple.  Cardiovascular: Normal rate, regular rhythm and normal heart sounds.   Pulmonary/Chest: Effort normal and  breath sounds normal.  Musculoskeletal: Normal range of motion.  Neurological: Alert and oriented to person, place, and time.  Skin: Skin is warm and dry.  Psychiatric: Normal mood and affect. Behavior is normal. Judgment and thought content normal.  Vitals reviewed.     BP 102/64 (BP Location: Left Arm, Patient Position: Sitting, Cuff Size: Normal)   Pulse 73   Temp 97.9 F (36.6 C) (Oral)   Ht 4\' 10"  (1.473 m)   Wt 125 lb (56.7 kg)   SpO2 98%   BMI 26.13 kg/m  Wt Readings from Last 3 Encounters:  12/23/17 125 lb (56.7 kg)  11/07/17 122 lb (55.3 kg)  10/03/17 123 lb 12.8 oz (56.2 kg)       Assessment & Plan:  1. Essential hypertension - doing well on lisinopril 5 mg, no side effects, blood pressure well controlled - follow up in 6 months for recheck, labs   Clarene Reamer, FNP-BC  [facility], [place]  12/23/2017 11:30 AM

## 2018-05-07 DIAGNOSIS — Z1231 Encounter for screening mammogram for malignant neoplasm of breast: Secondary | ICD-10-CM | POA: Diagnosis not present

## 2018-05-07 DIAGNOSIS — Z01419 Encounter for gynecological examination (general) (routine) without abnormal findings: Secondary | ICD-10-CM | POA: Diagnosis not present

## 2018-05-07 DIAGNOSIS — Z6826 Body mass index (BMI) 26.0-26.9, adult: Secondary | ICD-10-CM | POA: Diagnosis not present

## 2018-06-06 ENCOUNTER — Other Ambulatory Visit: Payer: Self-pay | Admitting: Family Medicine

## 2018-06-06 DIAGNOSIS — I10 Essential (primary) hypertension: Secondary | ICD-10-CM

## 2018-06-23 ENCOUNTER — Ambulatory Visit (INDEPENDENT_AMBULATORY_CARE_PROVIDER_SITE_OTHER): Payer: BLUE CROSS/BLUE SHIELD | Admitting: Family Medicine

## 2018-06-23 ENCOUNTER — Encounter: Payer: Self-pay | Admitting: Family Medicine

## 2018-06-23 VITALS — BP 104/77 | HR 89 | Ht <= 58 in | Wt 125.0 lb

## 2018-06-23 DIAGNOSIS — I10 Essential (primary) hypertension: Secondary | ICD-10-CM

## 2018-06-23 DIAGNOSIS — E7800 Pure hypercholesterolemia, unspecified: Secondary | ICD-10-CM

## 2018-06-23 MED ORDER — LISINOPRIL 10 MG PO TABS: 5.0000 mg | ORAL_TABLET | Freq: Every day | ORAL | 3 refills | Status: DC

## 2018-06-23 NOTE — Progress Notes (Signed)
Virtual Visit via Video Note  I connected with Terri Espinoza on 06/23/18 at  8:58 AM EDT by a video enabled telemedicine application and verified that I am speaking with the correct person using two identifiers.  Location: Patient: In her home Provider: [place]   I discussed the limitations of evaluation and management by telemedicine and the availability of in person appointments. The patient expressed understanding and agreed to proceed.  History of Present Illness: This is a 63 year old female who agrees to virtual visit today for follow-up of chronic medical conditions.  She reports that she has been doing well, has been able to work from home.  She has been staying busy with cleaning, organizing, yard work.  Hypertension- blood pressures been well controlled on lisinopril 10 mg.  She denies any side effects.  Hemorrhoids- she had banding of internal hemorrhoids last year.  She reports that she has had no further problems with bleeding.   Observations/Objective: Patient is alert and answers questions appropriately.  Visible skin is unremarkable.  She is normally conversive without any shortness of breath.  Mood and affect are appropriate.  BP 104/77 Comment: per patient  Pulse 89 Comment: per patient  Ht 4\' 10"  (1.473 m)   Wt 125 lb (56.7 kg) Comment: per patient  BMI 26.13 kg/m  Wt Readings from Last 3 Encounters:  06/23/18 125 lb (56.7 kg)  12/23/17 125 lb (56.7 kg)  11/07/17 122 lb (55.3 kg)    Assessment and Plan: 1. Essential hypertension -Blood pressure well controlled -She will schedule lab only visit and drop off her biometric screening paperwork to be completed - Basic Metabolic Panel; Future - lisinopril (ZESTRIL) 10 MG tablet; Take 0.5 tablets (5 mg total) by mouth daily.  Dispense: 90 tablet; Refill: 3  2. Elevated cholesterol - Lipid panel; Future  -Follow-up in 6 months Clarene Reamer, FNP-BC  [facility], [facility]  06/23/2018 9:12 AM   Follow Up Instructions: Visit recap sent to patient via my chart.   I discussed the assessment and treatment plan with the patient. The patient was provided an opportunity to ask questions and all were answered. The patient agreed with the plan and demonstrated an understanding of the instructions.   The patient was advised to call back or seek an in-person evaluation if the symptoms worsen or if the condition fails to improve as anticipated.   Elby Beck, FNP

## 2018-06-24 ENCOUNTER — Other Ambulatory Visit (INDEPENDENT_AMBULATORY_CARE_PROVIDER_SITE_OTHER): Payer: BLUE CROSS/BLUE SHIELD

## 2018-06-24 DIAGNOSIS — E7800 Pure hypercholesterolemia, unspecified: Secondary | ICD-10-CM | POA: Diagnosis not present

## 2018-06-24 DIAGNOSIS — I10 Essential (primary) hypertension: Secondary | ICD-10-CM

## 2018-06-24 LAB — LIPID PANEL
Cholesterol: 235 mg/dL — ABNORMAL HIGH (ref 0–200)
HDL: 62.6 mg/dL (ref >39.00)
LDL Cholesterol: 145 mg/dL — ABNORMAL HIGH (ref 0–99)
NonHDL: 172.34
Total CHOL/HDL Ratio: 4
Triglycerides: 138 mg/dL (ref 0.0–149.0)
VLDL: 27.6 mg/dL (ref 0.0–40.0)

## 2018-06-24 LAB — BASIC METABOLIC PANEL
BUN: 17 mg/dL (ref 6–23)
CO2: 28 mEq/L (ref 19–32)
Calcium: 8.6 mg/dL (ref 8.4–10.5)
Chloride: 105 mEq/L (ref 96–112)
Creatinine, Ser: 0.91 mg/dL (ref 0.40–1.20)
GFR: 62.46 mL/min (ref >60.00)
Glucose, Bld: 81 mg/dL (ref 70–99)
Potassium: 4.5 mEq/L (ref 3.5–5.1)
Sodium: 140 mEq/L (ref 135–145)

## 2019-05-15 ENCOUNTER — Other Ambulatory Visit: Payer: Self-pay

## 2019-05-15 ENCOUNTER — Ambulatory Visit: Payer: BC Managed Care – PPO | Attending: Internal Medicine

## 2019-05-15 DIAGNOSIS — Z23 Encounter for immunization: Secondary | ICD-10-CM

## 2019-05-15 NOTE — Progress Notes (Signed)
   Covid-19 Vaccination Clinic  Name:  NEVELYN DUFFIELD    MRN: [id number] DOB: 07-Apr-1955  05/15/2019  Ms. Bara was observed post Covid-19 immunization for 15 minutes without incident. She was provided with Vaccine Information Sheet and instruction to access the V-Safe system.   Ms. Bourdeau was instructed to call 911 with any severe reactions post vaccine: Marland Kitchen Difficulty breathing  . Swelling of face and throat  . A fast heartbeat  . A bad rash all over body  . Dizziness and weakness   Immunizations Administered    Name Date Dose VIS Date Route   Pfizer COVID-19 Vaccine 05/15/2019  9:48 AM 0.3 mL 01/23/2019 Intramuscular   Manufacturer: [place]   Lot: [id number]   [place]: [id number]

## 2019-05-27 DIAGNOSIS — Z01419 Encounter for gynecological examination (general) (routine) without abnormal findings: Secondary | ICD-10-CM | POA: Diagnosis not present

## 2019-05-27 DIAGNOSIS — Z6826 Body mass index (BMI) 26.0-26.9, adult: Secondary | ICD-10-CM | POA: Diagnosis not present

## 2019-06-10 ENCOUNTER — Ambulatory Visit: Payer: BC Managed Care – PPO | Attending: Internal Medicine

## 2019-06-10 DIAGNOSIS — Z23 Encounter for immunization: Secondary | ICD-10-CM

## 2019-06-10 NOTE — Progress Notes (Signed)
   Covid-19 Vaccination Clinic  Name:  CHARLICIA BLACKHAM    MRN: [id number] DOB: 02-23-1955  06/10/2019  Ms. Kudrna was observed post Covid-19 immunization for 15 minutes without incident. She was provided with Vaccine Information Sheet and instruction to access the V-Safe system.   Ms. Akamine was instructed to call 911 with any severe reactions post vaccine: Marland Kitchen Difficulty breathing  . Swelling of face and throat  . A fast heartbeat  . A bad rash all over body  . Dizziness and weakness   Immunizations Administered    Name Date Dose VIS Date Route   Pfizer COVID-19 Vaccine 06/10/2019  1:45 PM 0.3 mL 04/08/2018 Intramuscular   Manufacturer: [place]   Lot: [id number]   [place]: [id number]

## 2019-06-18 ENCOUNTER — Ambulatory Visit: Payer: BLUE CROSS/BLUE SHIELD | Admitting: Dermatology

## 2019-06-18 ENCOUNTER — Encounter: Payer: Self-pay | Admitting: Dermatology

## 2019-06-18 ENCOUNTER — Other Ambulatory Visit: Payer: Self-pay

## 2019-06-18 DIAGNOSIS — L578 Other skin changes due to chronic exposure to nonionizing radiation: Secondary | ICD-10-CM

## 2019-06-18 DIAGNOSIS — L821 Other seborrheic keratosis: Secondary | ICD-10-CM

## 2019-06-18 DIAGNOSIS — D1800 Hemangioma unspecified site: Secondary | ICD-10-CM

## 2019-06-18 DIAGNOSIS — L814 Other melanin hyperpigmentation: Secondary | ICD-10-CM

## 2019-06-18 DIAGNOSIS — Z85828 Personal history of other malignant neoplasm of skin: Secondary | ICD-10-CM

## 2019-06-18 DIAGNOSIS — Z85820 Personal history of malignant melanoma of skin: Secondary | ICD-10-CM | POA: Diagnosis not present

## 2019-06-18 DIAGNOSIS — D229 Melanocytic nevi, unspecified: Secondary | ICD-10-CM | POA: Diagnosis not present

## 2019-06-18 DIAGNOSIS — D492 Neoplasm of unspecified behavior of bone, soft tissue, and skin: Secondary | ICD-10-CM

## 2019-06-18 DIAGNOSIS — D2261 Melanocytic nevi of right upper limb, including shoulder: Secondary | ICD-10-CM | POA: Diagnosis not present

## 2019-06-18 DIAGNOSIS — Z1283 Encounter for screening for malignant neoplasm of skin: Secondary | ICD-10-CM | POA: Diagnosis not present

## 2019-06-18 DIAGNOSIS — D485 Neoplasm of uncertain behavior of skin: Secondary | ICD-10-CM

## 2019-06-18 NOTE — Patient Instructions (Signed)

## 2019-06-18 NOTE — Progress Notes (Addendum)
Follow-Up Visit   Subjective  Terri Espinoza is a 64 y.o. female who presents for the following: Annual Exam (Yearly TBSE hx of Melanoma, Hx of BCC, Hx of Dysplastic nevus). Patient presents for total body skin examination for skin cancer screening and mole check.  The following portions of the chart were reviewed this encounter and updated as appropriate:  Tobacco  Allergies  Meds  Problems  Med Hx  Surg Hx  Fam Hx      Review of Systems:  No other skin or systemic complaints except as noted in HPI or Assessment and Plan.  Objective  Well appearing patient in no apparent distress; mood and affect are within normal limits.  A full examination was performed including scalp, head, eyes, ears, nose, lips, neck, chest, axillae, abdomen, back, buttocks, bilateral upper extremities, bilateral lower extremities, hands, feet, fingers, toes, fingernails, and toenails. All findings within normal limits unless otherwise noted below.  Objective  R mid medial scapula: 0.4 cm irregular brown macule    Assessment & Plan    Skin cancer screening performed today.  History of Melanoma R back 2000 Abdomen 2000 - No evidence of recurrence today - No lymphadenopathy - Recommend regular full body skin exams - Recommend daily broad spectrum sunscreen SPF 30+ to sun-exposed areas, reapply every 2 hours as needed.  - Call if any new or changing lesions are noted between office visits  Seborrheic Keratoses - Stuck-on, waxy, tan-brown papules and plaques  - Discussed benign etiology and prognosis. - Observe - Call for any changes  Actinic Damage - diffuse scaly erythematous macules with underlying dyspigmentation - Recommend daily broad spectrum sunscreen SPF 30+ to sun-exposed areas, reapply every 2 hours as needed.  - Call for new or changing lesions.  History of Basal Cell Carcinoma of the Skin - No evidence of recurrence today - Recommend regular full body skin exams - Recommend  daily broad spectrum sunscreen SPF 30+ to sun-exposed areas, reapply every 2 hours as needed.  - Call if any new or changing lesions are noted between office visits  Melanocytic Nevi - Tan-brown and/or pink-flesh-colored symmetric macules and papules - Benign appearing on exam today - Observation - Call clinic for new or changing moles - Recommend daily use of broad spectrum spf 30+ sunscreen to sun-exposed areas.   Lentigines - Scattered tan macules - Discussed due to sun exposure - Benign, observe - Call for any changes  Hemangiomas - Red papules - Discussed benign nature - Observe - Call for any changes  Neoplasm of skin R mid medial scapula  Epidermal / dermal shaving  Lesion length (cm):  0.4 Lesion width (cm):  0.4 Margin per side (cm):  0.2 Total excision diameter (cm):  0.8 Informed consent: discussed and consent obtained   Timeout: patient name, date of birth, surgical site, and procedure verified   Procedure prep:  Patient was prepped and draped in usual sterile fashion Prep type:  Isopropyl alcohol Anesthesia: the lesion was anesthetized in a standard fashion   Anesthetic:  1% lidocaine w/ epinephrine 1-100,000 buffered w/ 8.4% NaHCO3 Hemostasis achieved with: pressure, aluminum chloride and electrodesiccation   Outcome: patient tolerated procedure well   Post-procedure details: sterile dressing applied and wound care instructions given   Dressing type: bandage and petrolatum    Specimen 1 - Surgical pathology Differential Diagnosis: R/O dysplastic nevus  Check Margins: No 0.4 cm irregular brown macule  Skin cancer screening  Return in about 1 year (around 06/17/2020) for  hx of MM.  I, [name], CMA, am acting as scribe for Sarina Ser, MD .  Documentation: I have reviewed the above documentation for accuracy and completeness, and I agree with the above.  Sarina Ser, MD

## 2019-06-19 ENCOUNTER — Encounter: Payer: Self-pay | Admitting: Dermatology

## 2019-06-24 ENCOUNTER — Telehealth: Payer: Self-pay

## 2019-06-24 NOTE — Progress Notes (Signed)
Follow-Up Visit   Subjective  ARAIYAH Espinoza is a 64 y.o. female who presents for the following: Annual Exam (Yearly TBSE hx of Melanoma, Hx of BCC, Hx of Dysplastic nevus). Patient presents for total body skin examination for skin cancer screening and mole check.  The following portions of the chart were reviewed this encounter and updated as appropriate:  Tobacco  Allergies  Meds  Problems  Med Hx  Surg Hx  Fam Hx      Review of Systems:  No other skin or systemic complaints except as noted in HPI or Assessment and Plan.  Objective  Well appearing patient in no apparent distress; mood and affect are within normal limits.  A full examination was performed including scalp, head, eyes, ears, nose, lips, neck, chest, axillae, abdomen, back, buttocks, bilateral upper extremities, bilateral lower extremities, hands, feet, fingers, toes, fingernails, and toenails. All findings within normal limits unless otherwise noted below.  Objective  R mid medial scapula: 0.4 cm irregular brown macule    Assessment & Plan    Skin cancer screening performed today.  History of Melanoma R back 2000 Abdomen 2000 - No evidence of recurrence today - No lymphadenopathy - Recommend regular full body skin exams - Recommend daily broad spectrum sunscreen SPF 30+ to sun-exposed areas, reapply every 2 hours as needed.  - Call if any new or changing lesions are noted between office visits  Seborrheic Keratoses - Stuck-on, waxy, tan-brown papules and plaques  - Discussed benign etiology and prognosis. - Observe - Call for any changes  Actinic Damage - diffuse scaly erythematous macules with underlying dyspigmentation - Recommend daily broad spectrum sunscreen SPF 30+ to sun-exposed areas, reapply every 2 hours as needed.  - Call for new or changing lesions.  History of Basal Cell Carcinoma of the Skin - No evidence of recurrence today - Recommend regular full body skin exams - Recommend  daily broad spectrum sunscreen SPF 30+ to sun-exposed areas, reapply every 2 hours as needed.  - Call if any new or changing lesions are noted between office visits  Melanocytic Nevi - Tan-brown and/or pink-flesh-colored symmetric macules and papules - Benign appearing on exam today - Observation - Call clinic for new or changing moles - Recommend daily use of broad spectrum spf 30+ sunscreen to sun-exposed areas.   Lentigines - Scattered tan macules - Discussed due to sun exposure - Benign, observe - Call for any changes  Hemangiomas - Red papules - Discussed benign nature - Observe - Call for any changes  Neoplasm of skin R mid medial scapula  Epidermal / dermal shaving  Lesion length (cm):  0.4 Lesion width (cm):  0.4 Margin per side (cm):  0.2 Total excision diameter (cm):  0.8 Informed consent: discussed and consent obtained   Timeout: patient name, date of birth, surgical site, and procedure verified   Procedure prep:  Patient was prepped and draped in usual sterile fashion Prep type:  Isopropyl alcohol Anesthesia: the lesion was anesthetized in a standard fashion   Anesthetic:  1% lidocaine w/ epinephrine 1-100,000 buffered w/ 8.4% NaHCO3 Hemostasis achieved with: pressure, aluminum chloride and electrodesiccation   Outcome: patient tolerated procedure well   Post-procedure details: sterile dressing applied and wound care instructions given   Dressing type: bandage and petrolatum    Specimen 1 - Surgical pathology Differential Diagnosis: R/O dysplastic nevus  Check Margins: No 0.4 cm irregular brown macule  Skin cancer screening  Return in about 1 year (around 06/17/2020) for  hx of MM.  I, Terri Espinoza, CMA, am acting as scribe for Sarina Ser, MD .  Documentation: I have reviewed the above documentation for accuracy and completeness, and I agree with the above.  Sarina Ser, MD

## 2019-06-24 NOTE — Telephone Encounter (Signed)
Discussed biopsy results with pt  °

## 2019-08-18 DIAGNOSIS — Z1231 Encounter for screening mammogram for malignant neoplasm of breast: Secondary | ICD-10-CM | POA: Diagnosis not present

## 2019-08-18 LAB — HM MAMMOGRAPHY

## 2019-09-01 ENCOUNTER — Other Ambulatory Visit: Payer: Self-pay | Admitting: Family Medicine

## 2019-09-01 DIAGNOSIS — I10 Essential (primary) hypertension: Secondary | ICD-10-CM

## 2019-09-15 ENCOUNTER — Ambulatory Visit: Payer: BC Managed Care – PPO | Admitting: Dermatology

## 2019-09-15 DIAGNOSIS — D239 Other benign neoplasm of skin, unspecified: Secondary | ICD-10-CM

## 2019-09-15 HISTORY — DX: Other benign neoplasm of skin, unspecified: D23.9

## 2019-09-16 ENCOUNTER — Other Ambulatory Visit: Payer: Self-pay

## 2019-09-16 ENCOUNTER — Encounter: Payer: Self-pay | Admitting: Dermatology

## 2019-09-16 ENCOUNTER — Ambulatory Visit: Payer: BC Managed Care – PPO | Admitting: Dermatology

## 2019-09-16 DIAGNOSIS — D239 Other benign neoplasm of skin, unspecified: Secondary | ICD-10-CM

## 2019-09-16 DIAGNOSIS — L814 Other melanin hyperpigmentation: Secondary | ICD-10-CM | POA: Diagnosis not present

## 2019-09-16 DIAGNOSIS — D225 Melanocytic nevi of trunk: Secondary | ICD-10-CM | POA: Diagnosis not present

## 2019-09-16 NOTE — Progress Notes (Signed)
   Follow-Up Visit   Subjective  Terri Espinoza is a 64 y.o. female who presents for the following: bx proven dysplastic nevus moderate to severe atypia (R mid medial scapula, bx 06/18/19).  The following portions of the chart were reviewed this encounter and updated as appropriate:  Tobacco  Allergies  Meds  Problems  Med Hx  Surg Hx  Fam Hx     Review of Systems:  No other skin or systemic complaints except as noted in HPI or Assessment and Plan.  Objective  Well appearing patient in no apparent distress; mood and affect are within normal limits.  A focused examination was performed including back. Relevant physical exam findings are noted in the Assessment and Plan.  Objective  Right mid medial scapula: Pink bx site   Assessment & Plan  Dysplastic nevus Right mid medial scapula  Bx proven moderate to severe  Epidermal / dermal shaving - Right mid medial scapula  Lesion diameter (cm):  1.1 Informed consent: discussed and consent obtained   Timeout: patient name, date of birth, surgical site, and procedure verified   Procedure prep:  Patient was prepped and draped in usual sterile fashion Prep type:  Isopropyl alcohol Anesthesia: the lesion was anesthetized in a standard fashion   Anesthetic:  1% lidocaine w/ epinephrine 1-100,000 buffered w/ 8.4% NaHCO3 Instrument used: flexible razor blade   Hemostasis achieved with: pressure, aluminum chloride and electrodesiccation   Outcome: patient tolerated procedure well   Post-procedure details: sterile dressing applied and wound care instructions given   Dressing type: bandage and petrolatum    Specimen 1 - Surgical pathology Differential Diagnosis: D48.5 Dysplastic Nevus Check Margins: yes Pink bx site [phone]  Return for as scheduled for TBSE.   I, Othelia Pulling, RMA, am acting as scribe for Sarina Ser, MD .  Documentation: I have reviewed the above documentation for accuracy and completeness, and I  agree with the above.  Sarina Ser, MD

## 2019-09-16 NOTE — Patient Instructions (Signed)

## 2019-09-18 ENCOUNTER — Other Ambulatory Visit: Payer: Self-pay | Admitting: Family Medicine

## 2019-09-18 DIAGNOSIS — I10 Essential (primary) hypertension: Secondary | ICD-10-CM

## 2019-09-18 DIAGNOSIS — E785 Hyperlipidemia, unspecified: Secondary | ICD-10-CM

## 2019-09-18 DIAGNOSIS — E039 Hypothyroidism, unspecified: Secondary | ICD-10-CM

## 2019-09-21 ENCOUNTER — Other Ambulatory Visit: Payer: Self-pay

## 2019-09-21 ENCOUNTER — Other Ambulatory Visit (INDEPENDENT_AMBULATORY_CARE_PROVIDER_SITE_OTHER): Payer: BC Managed Care – PPO

## 2019-09-21 DIAGNOSIS — I10 Essential (primary) hypertension: Secondary | ICD-10-CM

## 2019-09-21 DIAGNOSIS — E039 Hypothyroidism, unspecified: Secondary | ICD-10-CM

## 2019-09-21 DIAGNOSIS — E785 Hyperlipidemia, unspecified: Secondary | ICD-10-CM

## 2019-09-21 LAB — CBC WITH DIFFERENTIAL/PLATELET
Basophils Absolute: 0.1 10*3/uL (ref 0.0–0.1)
Basophils Relative: 0.9 % (ref 0.0–3.0)
Eosinophils Absolute: 0.1 10*3/uL (ref 0.0–0.7)
Eosinophils Relative: 2.1 % (ref 0.0–5.0)
HCT: 35.8 % — ABNORMAL LOW (ref 36.0–46.0)
Hemoglobin: 12.1 g/dL (ref 12.0–15.0)
Lymphocytes Relative: 19.1 % (ref 12.0–46.0)
Lymphs Abs: 1.3 10*3/uL (ref 0.7–4.0)
MCHC: 33.9 g/dL (ref 30.0–36.0)
MCV: 92.7 fl (ref 78.0–100.0)
Monocytes Absolute: 0.7 10*3/uL (ref 0.1–1.0)
Monocytes Relative: 10.4 % (ref 3.0–12.0)
Neutro Abs: 4.6 10*3/uL (ref 1.4–7.7)
Neutrophils Relative %: 67.5 % (ref 43.0–77.0)
Platelets: 181 10*3/uL (ref 150.0–400.0)
RBC: 3.87 Mil/uL (ref 3.87–5.11)
RDW: 12.8 % (ref 11.5–15.5)
WBC: 6.9 10*3/uL (ref 4.0–10.5)

## 2019-09-21 LAB — COMPREHENSIVE METABOLIC PANEL
ALT: 10 U/L (ref 0–35)
AST: 17 U/L (ref 0–37)
Albumin: 4 g/dL (ref 3.5–5.2)
Alkaline Phosphatase: 52 U/L (ref 39–117)
BUN: 17 mg/dL (ref 6–23)
CO2: 27 mEq/L (ref 19–32)
Calcium: 8.8 mg/dL (ref 8.4–10.5)
Chloride: 106 mEq/L (ref 96–112)
Creatinine, Ser: 1.11 mg/dL (ref 0.40–1.20)
GFR: 49.46 mL/min — ABNORMAL LOW (ref >60.00)
Glucose, Bld: 90 mg/dL (ref 70–99)
Potassium: 4.4 mEq/L (ref 3.5–5.1)
Sodium: 140 mEq/L (ref 135–145)
Total Bilirubin: 0.4 mg/dL (ref 0.2–1.2)
Total Protein: 6.6 g/dL (ref 6.0–8.3)

## 2019-09-21 LAB — LIPID PANEL
Cholesterol: 258 mg/dL — ABNORMAL HIGH (ref 0–200)
HDL: 71 mg/dL (ref >39.00)
LDL Cholesterol: 164 mg/dL — ABNORMAL HIGH (ref 0–99)
NonHDL: 187.36
Total CHOL/HDL Ratio: 4
Triglycerides: 117 mg/dL (ref 0.0–149.0)
VLDL: 23.4 mg/dL (ref 0.0–40.0)

## 2019-09-21 LAB — TSH: TSH: 27.05 u[IU]/mL — ABNORMAL HIGH (ref 0.35–4.50)

## 2019-09-23 ENCOUNTER — Telehealth: Payer: Self-pay

## 2019-09-23 NOTE — Telephone Encounter (Signed)
Biopsy results discussed with pt  

## 2019-09-23 NOTE — Telephone Encounter (Signed)
-----   Message from Ralene Bathe, MD sent at 09/23/2019 11:20 AM EDT ----- Skin , right mid medial scapula PERSISTENT DYSPLASTIC NEVUS, MARGINS FREE, AND SOLAR LENTIGO  Dysplastic Margins free Recheck next visit

## 2019-09-28 ENCOUNTER — Ambulatory Visit (INDEPENDENT_AMBULATORY_CARE_PROVIDER_SITE_OTHER): Payer: BC Managed Care – PPO | Admitting: Family Medicine

## 2019-09-28 ENCOUNTER — Other Ambulatory Visit: Payer: Self-pay

## 2019-09-28 ENCOUNTER — Encounter: Payer: Self-pay | Admitting: Family Medicine

## 2019-09-28 VITALS — BP 124/76 | HR 68 | Temp 95.5°F | Ht <= 58 in | Wt 126.5 lb

## 2019-09-28 DIAGNOSIS — E7841 Elevated Lipoprotein(a): Secondary | ICD-10-CM | POA: Diagnosis not present

## 2019-09-28 DIAGNOSIS — R944 Abnormal results of kidney function studies: Secondary | ICD-10-CM | POA: Diagnosis not present

## 2019-09-28 DIAGNOSIS — J309 Allergic rhinitis, unspecified: Secondary | ICD-10-CM

## 2019-09-28 DIAGNOSIS — N182 Chronic kidney disease, stage 2 (mild): Secondary | ICD-10-CM | POA: Insufficient documentation

## 2019-09-28 DIAGNOSIS — I10 Essential (primary) hypertension: Secondary | ICD-10-CM

## 2019-09-28 DIAGNOSIS — Z0000 Encounter for general adult medical examination without abnormal findings: Secondary | ICD-10-CM | POA: Diagnosis not present

## 2019-09-28 NOTE — Progress Notes (Signed)
Subjective:    Patient ID: Terri Espinoza, female    DOB: 01-May-1955, 64 y.o.   MRN: [id number]  HPI Chief Complaint  Patient presents with   Annual Exam   This is a 64 yo female who presents today for annual exam. Has been doing ok. Working from home, mandatory overtime.   Last CPE- gyn this spring Mammo- last month at gyn Pap-gyn Colonoscopy- 04/01/2017 Tdap- 07/15/2008 Flu- annual Covid 19 vaccine- fully vaccinated Eye- regular Dental- regular Exercise- walks dog twice a day     Review of Systems  Constitutional: Negative.   HENT: Positive for congestion, postnasal drip and rhinorrhea.   Eyes: Negative.   Respiratory: Negative.   Cardiovascular: Negative.   Gastrointestinal: Positive for constipation (occasional, takes fiber).  Endocrine: Negative.   Genitourinary: Positive for urgency (without incontinence).  Musculoskeletal: Negative.   Allergic/Immunologic: Positive for environmental allergies.  Neurological: Negative.   Hematological: Negative.   Psychiatric/Behavioral: Negative.        Objective:   Physical Exam Physical Exam  Constitutional: She is oriented to person, place, and time. She appears well-developed and well-nourished. No distress.  HENT:  Head: Normocephalic and atraumatic.  Right Ear: External ear normal. TM normal.  Left Ear: External ear normal. TM normal.  Nose: Nose normal.  Mouth/Throat: Oropharynx is clear and moist. No oropharyngeal exudate.  Eyes: Conjunctivae are normal.   Neck: Normal range of motion. Neck supple. No JVD present. No thyromegaly present.  Cardiovascular: Normal rate, regular rhythm, normal heart sounds and intact distal pulses.   Abdominal: Soft. Bowel sounds are normal. She exhibits no distension and no mass. There is no tenderness. There is no rebound and no guarding.  Musculoskeletal: Normal range of motion. She exhibits no edema or tenderness.  Lymphadenopathy:    She has no cervical adenopathy.    Neurological: She is alert and oriented to person, place, and time.   Skin: Skin is warm and dry. She is not diaphoretic.  Psychiatric: She has a normal mood and affect. Her behavior is normal. Judgment and thought content normal.  Vitals reviewed.     BP 124/76    Pulse 68    Temp (!) 95.5 F (35.3 C) (Temporal)    Ht 4\' 10"  (1.473 m)    Wt 126 lb 8 oz (57.4 kg)    SpO2 98%    BMI 26.44 kg/m  Wt Readings from Last 3 Encounters:  09/28/19 126 lb 8 oz (57.4 kg)  06/23/18 125 lb (56.7 kg)  12/23/17 125 lb (56.7 kg)   The 10-year ASCVD risk score Mikey Bussing DC Jr., et al., 2013) is: 6.5%   Values used to calculate the score:     Age: 58 years     Sex: Female     Is Non-Hispanic African American: No     Diabetic: No     Tobacco smoker: No     Systolic Blood Pressure: 882 mmHg     Is BP treated: Yes     HDL Cholesterol: 71 mg/dL     Total Cholesterol: 258 mg/dL    Assessment & Plan:  1. Annual physical exam - Discussed and encouraged healthy lifestyle choices- adequate sleep, regular exercise, stress management and healthy food choices.    2. Elevated lipoprotein(a) - discussed ASCVD risk calculation. She will work on diet and will recheck in 6 months  3. Allergic rhinitis, unspecified seasonality, unspecified trigger - discussed otc long acting antihistamines, fluticasone nasal spray  4. Decreased GFR -  will stop her lisinopril and recheck in 4 weeks, she will check her home blood pressure readings 1-2 x per week, she was provided goal blood pressure and she will notify me if running above this - continue to avoid NSAIDs  5. Essential hypertension - per #4  This visit occurred during the SARS-CoV-2 public health emergency.  Safety protocols were in place, including screening questions prior to the visit, additional usage of staff PPE, and extensive cleaning of exam room while observing appropriate contact time as indicated for disinfecting solutions.      Clarene Reamer,  FNP-BC  [facility], [place] [facility]  09/28/2019 1:21 PM

## 2019-09-28 NOTE — Patient Instructions (Signed)
Good to see you today  Stop your lisinopril, check your blood pressure 1-2 times per week for 4 weeks. Schedule a lab only visit for 4 weeks to recheck your kidney function.   Work on diet. See below. Recheck fasting cholesterol panel in 6 months- schedule follow up.   There is not one right eating plan for everyone.  It may take trial and error to find what will work for you.  It is important to get adequate protein and fiber with your meals.  It is okay to not eat breakfast or to skip meals if you are not hungry.  Avoid snacking between meals.  Unless you are on a fluid restriction, drink 80 to 90 ounces of water a day.  Suggested resources- www.ProfilePeek.ch www.adaptyourlifeacademy.com-there is a quiz to help you determine how many carbohydrates you should eat a day  www.thefastingmethod.com  If you have diabetes or access to a blood sugar machine, I recommend you check your blood sugar daily and keep a log.  Vary the time you check your blood sugar such as fasting, before meal, 2 hours after a meal and at bedtime.  Look for trends with the foods you are eating and be a scientist of your body.  Here are some guidelines to help you with meal planning -  Avoid all processed and packaged foods (bread, pasta, crackers, chips, etc) and beverages containing calories.  Avoid added sugars and excessive natural sugars.  Pay attention to how you feel if you consume artificial sweeteners.  Do they make you more hungry or raise your blood sugar?  With every meal and snack, aim to get 20 g of protein (3 ounces of meat, 4 ounces of fish, 3 eggs, protein powder, 1 cup [place] yogurt, 1 cup cottage cheese, etc.)  Increase fiber in the form of non-starchy vegetables.  These help you feel full with very little carbohydrates and are good for gut health.  Nonstarchy vegetables include summer squash, onions, peppers, tomatoes, eggplant, broccoli, cauliflower, cabbage, lettuce, spinach.  Have small  amounts of good fats such as avocado, nuts, olive oil, nut butters, olives.  Add a little cheese to your meals to make them tasty.   Try to plan your meals for the week and do some meal preparation when able.  If possible, make lunches for the week ahead of time.  Plan a couple of dinners and make enough so you can have leftovers.  Build in a treat once a week.

## 2019-10-02 ENCOUNTER — Encounter: Payer: Self-pay | Admitting: Family Medicine

## 2019-11-03 ENCOUNTER — Other Ambulatory Visit: Payer: Self-pay | Admitting: Family Medicine

## 2019-11-03 DIAGNOSIS — Z114 Encounter for screening for human immunodeficiency virus [HIV]: Secondary | ICD-10-CM

## 2019-11-03 DIAGNOSIS — R944 Abnormal results of kidney function studies: Secondary | ICD-10-CM

## 2019-11-09 ENCOUNTER — Other Ambulatory Visit (INDEPENDENT_AMBULATORY_CARE_PROVIDER_SITE_OTHER): Payer: BC Managed Care – PPO

## 2019-11-09 ENCOUNTER — Other Ambulatory Visit: Payer: Self-pay

## 2019-11-09 ENCOUNTER — Ambulatory Visit: Payer: BC Managed Care – PPO

## 2019-11-09 VITALS — BP 138/84 | HR 76

## 2019-11-09 DIAGNOSIS — I10 Essential (primary) hypertension: Secondary | ICD-10-CM

## 2019-11-09 DIAGNOSIS — R93429 Abnormal radiologic findings on diagnostic imaging of unspecified kidney: Secondary | ICD-10-CM

## 2019-11-09 DIAGNOSIS — Z114 Encounter for screening for human immunodeficiency virus [HIV]: Secondary | ICD-10-CM | POA: Diagnosis not present

## 2019-11-09 DIAGNOSIS — R944 Abnormal results of kidney function studies: Secondary | ICD-10-CM | POA: Diagnosis not present

## 2019-11-09 LAB — BASIC METABOLIC PANEL
BUN: 15 mg/dL (ref 6–23)
CO2: 28 mEq/L (ref 19–32)
Calcium: 8.8 mg/dL (ref 8.4–10.5)
Chloride: 104 mEq/L (ref 96–112)
Creatinine, Ser: 1.11 mg/dL (ref 0.40–1.20)
GFR: 49.44 mL/min — ABNORMAL LOW (ref >60.00)
Glucose, Bld: 90 mg/dL (ref 70–99)
Potassium: 4.1 mEq/L (ref 3.5–5.1)
Sodium: 139 mEq/L (ref 135–145)

## 2019-11-09 NOTE — Progress Notes (Signed)
Patient in office today for labs. She requested that we check her blood pressure. She states that she has been off medication and had been getting wide range of readings on home cuff. She did not have cuff with her today in office. encouraged her to bring to next visit so that we can check to our readings. Patient denies any chest pain, SOB, changes in vision or increased fatigue. Reading in office today is 138/84 with heart rate of 76. She states that is in average of what she is getting at home.   Reviewed home monitoring instructions with patient including body position and relaxation recommendations. She will call office if any questions or problems.

## 2019-11-10 LAB — HIV ANTIBODY (ROUTINE TESTING W REFLEX): HIV 1&2 Ab, 4th Generation: NONREACTIVE

## 2019-11-12 ENCOUNTER — Other Ambulatory Visit: Payer: Self-pay

## 2019-11-12 ENCOUNTER — Other Ambulatory Visit (INDEPENDENT_AMBULATORY_CARE_PROVIDER_SITE_OTHER): Payer: BC Managed Care – PPO

## 2019-11-12 DIAGNOSIS — R944 Abnormal results of kidney function studies: Secondary | ICD-10-CM | POA: Diagnosis not present

## 2019-11-12 LAB — URINALYSIS, ROUTINE W REFLEX MICROSCOPIC
Bilirubin Urine: NEGATIVE
Ketones, ur: NEGATIVE
Leukocytes,Ua: NEGATIVE
Nitrite: NEGATIVE
Specific Gravity, Urine: 1.02 (ref 1.000–1.030)
Total Protein, Urine: NEGATIVE
Urine Glucose: NEGATIVE
Urobilinogen, UA: 0.2 (ref 0.0–1.0)
pH: 6 (ref 5.0–8.0)

## 2019-11-12 LAB — MICROALBUMIN / CREATININE URINE RATIO
Creatinine,U: 117.5 mg/dL
Microalb Creat Ratio: 0.6 mg/g (ref 0.0–30.0)
Microalb, Ur: <0.7 mg/dL (ref 0.0–1.9)

## 2019-11-12 NOTE — Addendum Note (Signed)
Addended by: Clarene Reamer B on: 11/12/2019 09:46 AM   Modules accepted: Orders

## 2019-11-19 ENCOUNTER — Ambulatory Visit
Admission: RE | Admit: 2019-11-19 | Discharge: 2019-11-19 | Disposition: A | Discharge status: Home / Self Care | Payer: BC Managed Care – PPO | Source: Ambulatory Visit | Attending: Family Medicine | Admitting: Family Medicine

## 2019-11-19 ENCOUNTER — Other Ambulatory Visit: Payer: Self-pay

## 2019-11-19 DIAGNOSIS — R944 Abnormal results of kidney function studies: Secondary | ICD-10-CM

## 2019-11-23 NOTE — Addendum Note (Signed)
Addended by: Clarene Reamer B on: 11/23/2019 07:45 AM   Modules accepted: Orders

## 2019-12-17 DIAGNOSIS — R8290 Unspecified abnormal findings in urine: Secondary | ICD-10-CM | POA: Diagnosis not present

## 2019-12-17 DIAGNOSIS — N179 Acute kidney failure, unspecified: Secondary | ICD-10-CM | POA: Diagnosis not present

## 2019-12-17 DIAGNOSIS — I10 Essential (primary) hypertension: Secondary | ICD-10-CM | POA: Diagnosis not present

## 2020-01-21 DIAGNOSIS — N1831 Chronic kidney disease, stage 3a: Secondary | ICD-10-CM | POA: Diagnosis not present

## 2020-01-21 DIAGNOSIS — I10 Essential (primary) hypertension: Secondary | ICD-10-CM | POA: Diagnosis not present

## 2020-03-29 ENCOUNTER — Other Ambulatory Visit: Payer: Self-pay

## 2020-03-30 ENCOUNTER — Other Ambulatory Visit: Payer: Self-pay

## 2020-03-30 ENCOUNTER — Ambulatory Visit: Payer: BC Managed Care – PPO | Admitting: Family Medicine

## 2020-03-30 ENCOUNTER — Encounter: Payer: Self-pay | Admitting: Family Medicine

## 2020-03-30 VITALS — BP 126/78 | HR 103 | Temp 96.9°F | Ht <= 58 in | Wt 116.5 lb

## 2020-03-30 DIAGNOSIS — E039 Hypothyroidism, unspecified: Secondary | ICD-10-CM | POA: Diagnosis not present

## 2020-03-30 DIAGNOSIS — I10 Essential (primary) hypertension: Secondary | ICD-10-CM | POA: Diagnosis not present

## 2020-03-30 DIAGNOSIS — E7841 Elevated Lipoprotein(a): Secondary | ICD-10-CM

## 2020-03-30 DIAGNOSIS — R944 Abnormal results of kidney function studies: Secondary | ICD-10-CM

## 2020-03-30 LAB — LIPID PANEL
Cholesterol: 228 mg/dL — ABNORMAL HIGH (ref 0–200)
HDL: 66.5 mg/dL (ref >39.00)
LDL Cholesterol: 132 mg/dL — ABNORMAL HIGH (ref 0–99)
NonHDL: 161.59
Total CHOL/HDL Ratio: 3
Triglycerides: 150 mg/dL — ABNORMAL HIGH (ref 0.0–149.0)
VLDL: 30 mg/dL (ref 0.0–40.0)

## 2020-03-30 LAB — T4, FREE: Free T4: 0.72 ng/dL (ref 0.60–1.60)

## 2020-03-30 LAB — TSH: TSH: 5.92 u[IU]/mL — ABNORMAL HIGH (ref 0.35–4.50)

## 2020-03-30 NOTE — Assessment & Plan Note (Signed)
Disc goals for lipids and reasons to control them Rev last labs with pt Rev low sat fat diet in detail Last TSH was 164 Unsure If genetic -mother had high cholesterol Pt eats red meat weekly and occ fast food/butter/cheese Re check lipid panel today

## 2020-03-30 NOTE — Progress Notes (Signed)
Subjective:    Patient ID: Terri Espinoza, female    DOB: 03-06-1955, 65 y.o.   MRN: [id number]  This visit occurred during the SARS-CoV-2 public health emergency.  Safety protocols were in place, including screening questions prior to the visit, additional usage of staff PPE, and extensive cleaning of exam room while observing appropriate contact time as indicated for disinfecting solutions.    HPI 65 yo pt of NP [name] presents to establish care  Wt Readings from Last 3 Encounters:  03/30/20 116 lb 8 oz (52.8 kg)  09/28/19 126 lb 8 oz (57.4 kg)  06/23/18 125 lb (56.7 kg)   24.35 kg/m  Has been working on wt loss and is down 10 lb  Has a gazelle exercise and yoga on wii  Likes to walk daily if weather allows Cut back on bread/ chips /processed carbs   Cares for mother with multiple myeloma - does pretty well   She sees gyn every April Marcel[facility] Morel mammogram  Sees Dr Nori Riis   HTN bp is stable today  No cp or palpitations or headaches or edema  No side effects to medicines  BP Readings from Last 3 Encounters:  03/30/20 126/78  11/09/19 138/84  09/28/19 124/76     Takes lisinopril 2.5 for kidney health   Pulse Readings from Last 3 Encounters:  03/30/20 (!) 103  11/09/19 76  09/28/19 68     Lab Results  Component Value Date   CREATININE 1.11 11/09/2019   BUN 15 11/09/2019   NA 139 11/09/2019   K 4.1 11/09/2019   CL 104 11/09/2019   CO2 28 11/09/2019  last GFR 49.4   Sees nephrology Dr Joylene John for CKD  Had renal US with mildly inc renal echogenicity bilat  Lisinopril has improved her CrCl Avoids nsaids  Drinks more water   Hypothyroidism Lab Results  Component Value Date   TSH 27.05 (H) 09/21/2019  has never had thyroid problem before No meds - likely needs then  Not a lot of fatigue or skin /hair change  Will re check this     Hyperlipidemia Lab Results  Component Value Date   CHOL 258 (H) 09/21/2019   HDL 71.00 09/21/2019   LDLCALC 164 (H)  09/21/2019   LDLDIRECT 149.9 12/30/2012   TRIG 117.0 09/21/2019   CHOLHDL 4 09/21/2019   No medications currently  Debbie wanted to re check this in 6 months   Family history  Mother was on med for a short time  No CAD or vascular disease   Diet: Red meat once per week  Avoids fried foods  Fast food at most once per week -mostly eats at home  a little butter/cheese   Patient Active Problem List   Diagnosis Date Noted  . Decreased GFR 09/28/2019  . Prolapsed internal hemorrhoids, grade 2 09/07/2013  . Generalized anxiety disorder 03/05/2013  . Hypothyroidism 04/24/2012  . HLD (hyperlipidemia) 04/24/2012  . INCONTINENCE, URGE 12/19/2009  . Allergic rhinitis 08/08/2009  . DEPRESSION, MILD 01/29/2007  . Essential hypertension 01/29/2007   Past Medical History:  Diagnosis Date  . Allergic rhinitis   . Allergy   . Anal fissure   . Anxiety   . Arthritis   . Cancer ([place])    melanoma on back and upper abdomen  BCC also multiple  . Dysplastic nevus 09/15/2019   R mid medial scapula, moderate to severe, shave removal  . Hemorrhoids   . HLD (hyperlipidemia)   . HTN (hypertension)   .  Hx of basal cell carcinoma 01/11/2006   chest  . Hx of basal cell carcinoma 12/08/2009   L anticubital  . Hx of basal cell carcinoma 12/29/2013   L proximal antecubital  . Hx of basal cell carcinoma 12/23/2014   R low back  . Hx of dysplastic nevus 01/24/2007   R upper back, moderate atypia  . Hx of dysplastic nevus 06/22/2014   R post medial mid thigh  . Hx of dysplastic nevus 12/06/2014   R post waistline, mild atypia  . Hx of dysplastic nevus 12/06/2014   R sup lat buttocks, mild atypia  . Hypothyroidism    pt. denies  . Melanoma ([place]) 12/29/2009   R mid back, Melanoma IS  . Osteopenia   . Uterine fibroid    Past Surgical History:  Procedure Laterality Date  . ABDOMINAL HYSTERECTOMY  1995   partial, fibroids, Dr. Nori Riis  . COLONOSCOPY  2009  . FLEXIBLE SIGMOIDOSCOPY  2002  .  HEMORRHOID BANDING    . skin cancer removal     melanoma upper abdomen/back   BCC multiple   . URETHRAL DILATION  1988   Dr. Leory Plowman   Social History   Tobacco Use  . Smoking status: Former Research scientist (life sciences)  . Smokeless tobacco: Never Used  . Tobacco comment: quit 25 years ago  Vaping Use  . Vaping Use: Never used  Substance Use Topics  . Alcohol use: No  . Drug use: No   Family History  Problem Relation Age of Onset  . Hypertension Mother   . Hyperlipidemia Mother   . Alcohol abuse Father   . Cirrhosis Father   . Colon polyps Father   . Colon polyps Sister   . Hypertension Sister   . Hypertension Maternal Aunt   . Lung cancer Paternal Aunt   . Colon cancer Neg Hx   . Esophageal cancer Neg Hx   . Stomach cancer Neg Hx   . Rectal cancer Neg Hx    No Known Allergies Current Outpatient Medications on File Prior to Visit  Medication Sig Dispense Refill  . Calcium-Magnesium-Vitamin D (CALCIUM 500 PO) Take by mouth.    . cholecalciferol (VITAMIN D) 1000 UNITS tablet Take 5,000 Units by mouth daily.    Marland Kitchen estradiol (ESTRACE) 2 MG tablet Take 2 mg by mouth daily.    . fluticasone (FLONASE) 50 MCG/ACT nasal spray Place 2 sprays into both nostrils daily. 16 g 6  . lisinopril (ZESTRIL) 2.5 MG tablet Take 2.5 mg by mouth daily.    . Multiple Vitamin (MULTIVITAMIN) tablet Take 1 tablet by mouth daily.     No current facility-administered medications on file prior to visit.    Review of Systems  Constitutional: Negative for activity change, appetite change, fatigue, fever and unexpected weight change.  HENT: Negative for congestion, ear pain, rhinorrhea, sinus pressure and sore throat.   Eyes: Negative for pain, redness and visual disturbance.  Respiratory: Negative for cough, shortness of breath and wheezing.   Cardiovascular: Negative for chest pain and palpitations.  Gastrointestinal: Negative for abdominal pain, blood in stool, constipation and diarrhea.  Endocrine: Negative for  polydipsia and polyuria.  Genitourinary: Negative for dysuria, frequency and urgency.  Musculoskeletal: Negative for arthralgias, back pain and myalgias.  Skin: Negative for pallor and rash.  Allergic/Immunologic: Negative for environmental allergies.  Neurological: Negative for dizziness, syncope and headaches.  Hematological: Negative for adenopathy. Does not bruise/bleed easily.  Psychiatric/Behavioral: Negative for decreased concentration and dysphoric mood. The patient is not  nervous/anxious.        Objective:   Physical Exam Constitutional:      General: She is not in acute distress.    Appearance: Normal appearance. She is well-developed, normal weight and well-nourished. She is not ill-appearing.  HENT:     Head: Normocephalic and atraumatic.     Right Ear: External ear normal.     Left Ear: External ear normal.     Nose: Nose normal.     Mouth/Throat:     Mouth: Oropharynx is clear and moist.  Eyes:     General: No scleral icterus.       Right eye: No discharge.        Left eye: No discharge.     Extraocular Movements: EOM normal.     Conjunctiva/sclera: Conjunctivae normal.     Pupils: Pupils are equal, round, and reactive to light.  Neck:     Thyroid: No thyromegaly.     Vascular: No carotid bruit or JVD.  Cardiovascular:     Rate and Rhythm: Normal rate and regular rhythm.     Pulses: Intact distal pulses.     Heart sounds: Normal heart sounds. No gallop.   Pulmonary:     Effort: Pulmonary effort is normal. No respiratory distress.     Breath sounds: Normal breath sounds. No wheezing or rales.     Comments: No crackles Abdominal:     General: Bowel sounds are normal. There is no distension or abdominal bruit.     Palpations: Abdomen is soft. There is no mass.     Tenderness: There is no abdominal tenderness. There is no right CVA tenderness or left CVA tenderness.  Musculoskeletal:        General: No tenderness or edema.     Cervical back: Normal range of  motion and neck supple.     Right lower leg: No edema.     Left lower leg: No edema.  Lymphadenopathy:     Cervical: No cervical adenopathy.  Skin:    General: Skin is warm and dry.     Coloration: Skin is not pale.     Findings: No erythema or rash.  Neurological:     Mental Status: She is alert.     Cranial Nerves: No cranial nerve deficit.     Motor: No abnormal muscle tone.     Coordination: Coordination normal.     Deep Tendon Reflexes: Reflexes are normal and symmetric. Reflexes normal.  Psychiatric:        Mood and Affect: Mood and affect and mood normal.           Assessment & Plan:   Problem List Items Addressed This Visit      Cardiovascular and Mediastinum   Essential hypertension - Primary    bp in fair control at this time  BP Readings from Last 1 Encounters:  03/30/20 126/78   No changes needed Most recent labs reviewed  Disc lifstyle change with low sodium diet and exercise  Plan to continue lisinopril 2.5 mg daily      Relevant Medications   lisinopril (ZESTRIL) 2.5 MG tablet   Other Relevant Orders   TSH (Completed)     Endocrine   Hypothyroidism    Last tsh elevated at 27.5 this was new No meds  No symptoms No goiter  Re check tsh with FT4 today      Relevant Orders   TSH (Completed)   T4, free (Completed)  Other   HLD (hyperlipidemia)    Disc goals for lipids and reasons to control them Rev last labs with pt Rev low sat fat diet in detail Last TSH was 164 Unsure If genetic -mother had high cholesterol Pt eats red meat weekly and occ fast food/butter/cheese Re check lipid panel today      Relevant Medications   lisinopril (ZESTRIL) 2.5 MG tablet   Other Relevant Orders   Lipid panel (Completed)   Decreased GFR    Continues nephrology care  Takes lisinopril 2.5 mg daily

## 2020-03-30 NOTE — Assessment & Plan Note (Signed)
bp in fair control at this time  BP Readings from Last 1 Encounters:  03/30/20 126/78   No changes needed Most recent labs reviewed  Disc lifstyle change with low sodium diet and exercise  Plan to continue lisinopril 2.5 mg daily

## 2020-03-30 NOTE — Assessment & Plan Note (Signed)
Last tsh elevated at 27.5 this was new No meds  No symptoms No goiter  Re check tsh with FT4 today

## 2020-03-30 NOTE — Patient Instructions (Addendum)
For cholesterol  Avoid red meat/ fried foods/ egg yolks/ fatty breakfast meats/ butter, cheese and high fat dairy/ and shellfish    Labs today  For cholesterol  Also for some thyroid tests   Keep up the good work with diet and exercise

## 2020-03-30 NOTE — Assessment & Plan Note (Signed)
Continues nephrology care  Takes lisinopril 2.5 mg daily

## 2020-05-30 DIAGNOSIS — Z6824 Body mass index (BMI) 24.0-24.9, adult: Secondary | ICD-10-CM | POA: Diagnosis not present

## 2020-05-30 DIAGNOSIS — Z01419 Encounter for gynecological examination (general) (routine) without abnormal findings: Secondary | ICD-10-CM | POA: Diagnosis not present

## 2020-06-22 ENCOUNTER — Other Ambulatory Visit: Payer: Self-pay

## 2020-06-22 ENCOUNTER — Ambulatory Visit: Payer: BC Managed Care – PPO | Admitting: Dermatology

## 2020-06-22 DIAGNOSIS — L820 Inflamed seborrheic keratosis: Secondary | ICD-10-CM | POA: Diagnosis not present

## 2020-06-22 DIAGNOSIS — Z86006 Personal history of melanoma in-situ: Secondary | ICD-10-CM

## 2020-06-22 DIAGNOSIS — C4491 Basal cell carcinoma of skin, unspecified: Secondary | ICD-10-CM

## 2020-06-22 DIAGNOSIS — Z86018 Personal history of other benign neoplasm: Secondary | ICD-10-CM | POA: Diagnosis not present

## 2020-06-22 DIAGNOSIS — Z1283 Encounter for screening for malignant neoplasm of skin: Secondary | ICD-10-CM | POA: Diagnosis not present

## 2020-06-22 DIAGNOSIS — D492 Neoplasm of unspecified behavior of bone, soft tissue, and skin: Secondary | ICD-10-CM

## 2020-06-22 DIAGNOSIS — D229 Melanocytic nevi, unspecified: Secondary | ICD-10-CM

## 2020-06-22 DIAGNOSIS — L821 Other seborrheic keratosis: Secondary | ICD-10-CM

## 2020-06-22 DIAGNOSIS — C44712 Basal cell carcinoma of skin of right lower limb, including hip: Secondary | ICD-10-CM | POA: Diagnosis not present

## 2020-06-22 DIAGNOSIS — D1800 Hemangioma unspecified site: Secondary | ICD-10-CM

## 2020-06-22 DIAGNOSIS — Z85828 Personal history of other malignant neoplasm of skin: Secondary | ICD-10-CM

## 2020-06-22 DIAGNOSIS — L814 Other melanin hyperpigmentation: Secondary | ICD-10-CM

## 2020-06-22 DIAGNOSIS — L578 Other skin changes due to chronic exposure to nonionizing radiation: Secondary | ICD-10-CM

## 2020-06-22 HISTORY — DX: Basal cell carcinoma of skin, unspecified: C44.91

## 2020-06-22 NOTE — Progress Notes (Signed)
Follow-Up Visit   Subjective  Terri Espinoza is a 65 y.o. female who presents for the following: Total body skin exam (Hx of Melanoma IS r mid back, hx of BCCs Hx of dysplastic nevi). The patient presents for Total-Body Skin Exam (TBSE) for skin cancer screening and mole check.  The following portions of the chart were reviewed this encounter and updated as appropriate:   Tobacco  Allergies  Meds  Problems  Med Hx  Surg Hx  Fam Hx     Review of Systems:  No other skin or systemic complaints except as noted in HPI or Assessment and Plan.  Objective  Well appearing patient in no apparent distress; mood and affect are within normal limits.  A full examination was performed including scalp, head, eyes, ears, nose, lips, neck, chest, axillae, abdomen, back, buttocks, bilateral upper extremities, bilateral lower extremities, hands, feet, fingers, toes, fingernails, and toenails. All findings within normal limits unless otherwise noted below.  Objective  R mid back: Clear to visual exam and palpation, no lymphadenopathy  Objective  multiple: Well healed scar with no evidence of recurrence.   Objective  multiple: Scars with no evidence of recurrence.   Objective  L med scapula x 1: Erythematous keratotic or waxy stuck-on papule or plaque.   Objective  R prox lat calf near the knee: Pink patch 0.6cm   Assessment & Plan    Lentigines - Scattered tan macules - Due to sun exposure - Benign-appering, observe - Recommend daily broad spectrum sunscreen SPF 30+ to sun-exposed areas, reapply every 2 hours as needed. - Call for any changes  Seborrheic Keratoses - Stuck-on, waxy, tan-brown papules and/or plaques  - Benign-appearing - Discussed benign etiology and prognosis. - Observe - Call for any changes  Melanocytic Nevi - Tan-brown and/or pink-flesh-colored symmetric macules and papules - Benign appearing on exam today - Observation - Call clinic for new or  changing moles - Recommend daily use of broad spectrum spf 30+ sunscreen to sun-exposed areas.   Hemangiomas - Red papules - Discussed benign nature - Observe - Call for any changes  Actinic Damage - Chronic condition, secondary to cumulative UV/sun exposure - diffuse scaly erythematous macules with underlying dyspigmentation - Recommend daily broad spectrum sunscreen SPF 30+ to sun-exposed areas, reapply every 2 hours as needed.  - Staying in the shade or wearing long sleeves, sun glasses (UVA+UVB protection) and wide brim hats (4-inch brim around the entire circumference of the hat) are also recommended for sun protection.  - Call for new or changing lesions.  Skin cancer screening performed today.  History of melanoma in situ R mid back 12/2009 Clear.  No lymphadenopathy.  Observe for recurrence. Call clinic for new or changing lesions.  Recommend regular skin exams, daily broad-spectrum spf 30+ sunscreen use, and photoprotection.     History of basal cell carcinoma (BCC) multiple Clear. Observe for recurrence. Call clinic for new or changing lesions.  Recommend regular skin exams, daily broad-spectrum spf 30+ sunscreen use, and photoprotection.     History of dysplastic nevus multiple Clear. Observe for recurrence. Call clinic for new or changing lesions.  Recommend regular skin exams, daily broad-spectrum spf 30+ sunscreen use, and photoprotection.     Inflamed seborrheic keratosis L med scapula x 1 Destruction of lesion - L med scapula x 1 Complexity: simple   Destruction method: cryotherapy   Informed consent: discussed and consent obtained   Timeout:  patient name, date of birth, surgical site, and procedure  verified Lesion destroyed using liquid nitrogen: Yes   Region frozen until ice ball extended beyond lesion: Yes   Outcome: patient tolerated procedure well with no complications   Post-procedure details: wound care instructions given    Neoplasm of skin R prox  lat calf near the knee Skin / nail biopsy Type of biopsy: tangential   Informed consent: discussed and consent obtained   Timeout: patient name, date of birth, surgical site, and procedure verified   Procedure prep:  Patient was prepped and draped in usual sterile fashion Prep type:  Isopropyl alcohol Anesthesia: the lesion was anesthetized in a standard fashion   Anesthetic:  1% lidocaine w/ epinephrine 1-100,000 buffered w/ 8.4% NaHCO3 Instrument used: flexible razor blade   Outcome: patient tolerated procedure well   Post-procedure details: sterile dressing applied and wound care instructions given   Dressing type: bandage and bacitracin    Specimen 1 - Surgical pathology Differential Diagnosis: D48.5 R/O BCC  Check Margins: no Pink patch 0.6cm Small speciman  Return in about 1 year (around 06/22/2021) for TBSE, Hx of Melanoma IS, Hx of BCC, Hx of Dysplastic nevi.   I, Othelia Pulling, RMA, am acting as scribe for Sarina Ser, MD .  Documentation: I have reviewed the above documentation for accuracy and completeness, and I agree with the above.  Sarina Ser, MD

## 2020-06-22 NOTE — Patient Instructions (Addendum)
If you have any questions or concerns for your doctor, please call our main line at [phone] and press option 4 to reach your doctor's medical assistant. If no one answers, please leave a voicemail as directed and we will return your call as soon as possible. Messages left after 4 pm will be answered the following business day.   You may also send us a message via MyChart. We typically respond to MyChart messages within 1-2 business days.  For prescription refills, please ask your pharmacy to contact our office. Our fax number is [phone].  If you have an urgent issue when the clinic is closed that cannot wait until the next business day, you can page your doctor at the number below.    Please note that while we do our best to be available for urgent issues outside of office hours, we are not available 24/7.   If you have an urgent issue and are unable to reach us, you may choose to seek medical care at your doctor's office, retail clinic, urgent care center, or emergency room.  If you have a medical emergency, please immediately call 911 or go to the emergency department.  Pager Numbers  - Dr. Kowalski: [phone]  - Dr. Moye: [phone]  - Dr. Stewart: [phone]  In the event of inclement weather, please call our main line at [phone] for an update on the status of any delays or closures.  Dermatology Medication Tips: Please keep the boxes that topical medications come in in order to help keep track of the instructions about where and how to use these. Pharmacies typically print the medication instructions only on the boxes and not directly on the medication tubes.   If your medication is too expensive, please contact our office at [phone] option 4 or send us a message through MyChart.   We are unable to tell what your co-pay for medications will be in advance as this is different depending on your insurance coverage. However, we may be able to find a  substitute medication at lower cost or fill out paperwork to get insurance to cover a needed medication.   If a prior authorization is required to get your medication covered by your insurance company, please allow us 1-2 business days to complete this process.  Drug prices often vary depending on where the prescription is filled and some pharmacies may offer cheaper prices.  The website www.goodrx.com contains coupons for medications through different pharmacies. The prices here do not account for what the cost may be with help from insurance (it may be cheaper with your insurance), but the website can give you the price if you did not use any insurance.  - You can print the associated coupon and take it with your prescription to the pharmacy.  - You may also stop by our office during regular business hours and pick up a GoodRx coupon card.  - If you need your prescription sent electronically to a different pharmacy, notify our office through [facility] MyChart or by phone at [phone] option 4.     Wound Care Instructions  1. Cleanse wound gently with soap and water once a day then pat dry with clean gauze. Apply a thing coat of Petrolatum (petroleum jelly, "Vaseline") over the wound (unless you have an allergy to this). We recommend that you use a new, sterile tube of Vaseline. Do not pick or remove scabs. Do not remove the yellow or white "healing tissue" from the base of the wound.    2. Cover the wound with fresh, clean, nonstick gauze and secure with paper tape. You may use Band-Aids in place of gauze and tape if the would is small enough, but would recommend trimming much of the tape off as there is often too much. Sometimes Band-Aids can irritate the skin.  3. You should call the office for your biopsy report after 1 week if you have not already been contacted.  4. If you experience any problems, such as abnormal amounts of bleeding, swelling, significant bruising, significant pain,  or evidence of infection, please call the office immediately.  5. FOR ADULT SURGERY PATIENTS: If you need something for pain relief you may take 1 extra strength Tylenol (acetaminophen) AND 2 Ibuprofen (200mg each) together every 4 hours as needed for pain. (do not take these if you are allergic to them or if you have a reason you should not take them.) Typically, you may only need pain medication for 1 to 3 days.     

## 2020-06-28 ENCOUNTER — Encounter: Payer: Self-pay | Admitting: Dermatology

## 2020-06-29 ENCOUNTER — Telehealth: Payer: Self-pay

## 2020-06-29 NOTE — Telephone Encounter (Signed)
Advised patient of results and scheduled for EDC/hd  

## 2020-06-29 NOTE — Telephone Encounter (Signed)
-----   Message from Ralene Bathe, MD sent at 06/27/2020 11:03 AM EDT ----- Diagnosis Skin , right prox lat calf near the knee SUPERFICIAL BASAL CELL CARCINOMA, PERIPHERAL MARGIN INVOLVED  Cancer - BCC Schedule for treatment (EDC)

## 2020-07-05 ENCOUNTER — Other Ambulatory Visit: Payer: Self-pay

## 2020-07-05 ENCOUNTER — Ambulatory Visit: Payer: BC Managed Care – PPO | Admitting: Dermatology

## 2020-07-05 ENCOUNTER — Encounter: Payer: Self-pay | Admitting: Dermatology

## 2020-07-05 DIAGNOSIS — C44712 Basal cell carcinoma of skin of right lower limb, including hip: Secondary | ICD-10-CM | POA: Diagnosis not present

## 2020-07-05 NOTE — Patient Instructions (Signed)
If you have any questions or concerns for your doctor, please call our main line at [phone] and press option 4 to reach your doctor's medical assistant. If no one answers, please leave a voicemail as directed and we will return your call as soon as possible. Messages left after 4 pm will be answered the following business day.   You may also send us a message via MyChart. We typically respond to MyChart messages within 1-2 business days.  For prescription refills, please ask your pharmacy to contact our office. Our fax number is [phone].  If you have an urgent issue when the clinic is closed that cannot wait until the next business day, you can page your doctor at the number below.    Please note that while we do our best to be available for urgent issues outside of office hours, we are not available 24/7.   If you have an urgent issue and are unable to reach us, you may choose to seek medical care at your doctor's office, retail clinic, urgent care center, or emergency room.  If you have a medical emergency, please immediately call 911 or go to the emergency department.  Pager Numbers  - Dr. Kowalski: [phone]  - Dr. Moye: [phone]  - Dr. Stewart: [phone]  In the event of inclement weather, please call our main line at [phone] for an update on the status of any delays or closures.  Dermatology Medication Tips: Please keep the boxes that topical medications come in in order to help keep track of the instructions about where and how to use these. Pharmacies typically print the medication instructions only on the boxes and not directly on the medication tubes.   If your medication is too expensive, please contact our office at [phone] option 4 or send us a message through MyChart.   We are unable to tell what your co-pay for medications will be in advance as this is different depending on your insurance coverage. However, we may be able to find a  substitute medication at lower cost or fill out paperwork to get insurance to cover a needed medication.   If a prior authorization is required to get your medication covered by your insurance company, please allow us 1-2 business days to complete this process.  Drug prices often vary depending on where the prescription is filled and some pharmacies may offer cheaper prices.  The website www.goodrx.com contains coupons for medications through different pharmacies. The prices here do not account for what the cost may be with help from insurance (it may be cheaper with your insurance), but the website can give you the price if you did not use any insurance.  - You can print the associated coupon and take it with your prescription to the pharmacy.  - You may also stop by our office during regular business hours and pick up a GoodRx coupon card.  - If you need your prescription sent electronically to a different pharmacy, notify our office through [facility] MyChart or by phone at [phone] option 4.     Wound Care Instructions  1. Cleanse wound gently with soap and water once a day then pat dry with clean gauze. Apply a thing coat of Petrolatum (petroleum jelly, "Vaseline") over the wound (unless you have an allergy to this). We recommend that you use a new, sterile tube of Vaseline. Do not pick or remove scabs. Do not remove the yellow or white "healing tissue" from the base of the wound.    2. Cover the wound with fresh, clean, nonstick gauze and secure with paper tape. You may use Band-Aids in place of gauze and tape if the would is small enough, but would recommend trimming much of the tape off as there is often too much. Sometimes Band-Aids can irritate the skin.  3. You should call the office for your biopsy report after 1 week if you have not already been contacted.  4. If you experience any problems, such as abnormal amounts of bleeding, swelling, significant bruising, significant pain,  or evidence of infection, please call the office immediately.  5. FOR ADULT SURGERY PATIENTS: If you need something for pain relief you may take 1 extra strength Tylenol (acetaminophen) AND 2 Ibuprofen (200mg each) together every 4 hours as needed for pain. (do not take these if you are allergic to them or if you have a reason you should not take them.) Typically, you may only need pain medication for 1 to 3 days.     

## 2020-07-05 NOTE — Progress Notes (Signed)
   Follow-Up Visit   Subjective  Terri Espinoza is a 65 y.o. female who presents for the following: BCC bx proven (R proximal lat calf near the knee, pt presents for treatment today).  The following portions of the chart were reviewed this encounter and updated as appropriate:   Tobacco  Allergies  Meds  Problems  Med Hx  Surg Hx  Fam Hx     Review of Systems:  No other skin or systemic complaints except as noted in HPI or Assessment and Plan.  Objective  Well appearing patient in no apparent distress; mood and affect are within normal limits.  A focused examination was performed including R leg. Relevant physical exam findings are noted in the Assessment and Plan.  Objective  Right proximal lat calf near the knee: Pink bx site   Assessment & Plan  Basal cell carcinoma (BCC) of skin of right lower extremity including hip Right proximal lat calf near the knee  Destruction of lesion Complexity: extensive   Destruction method: electrodesiccation and curettage   Informed consent: discussed and consent obtained   Timeout:  patient name, date of birth, surgical site, and procedure verified Procedure prep:  Patient was prepped and draped in usual sterile fashion Prep type:  Isopropyl alcohol Anesthesia: the lesion was anesthetized in a standard fashion   Anesthetic:  1% lidocaine w/ epinephrine 1-100,000 buffered w/ 8.4% NaHCO3 Curettage performed in three different directions: Yes   Electrodesiccation performed over the curetted area: Yes   Final wound size (cm):  1.1 Hemostasis achieved with:  pressure, aluminum chloride and electrodesiccation Outcome: patient tolerated procedure well with no complications   Post-procedure details: sterile dressing applied and wound care instructions given   Dressing type: bandage and petrolatum    Bx proven  Shave removal and EDC today  Return for as scheduled for [facility] 06/22/21 hx of BCCs, Melanoma IS, Dysplastic nevi.  I, Othelia Pulling, RMA, am acting as scribe for Sarina Ser, MD .  Documentation: I have reviewed the above documentation for accuracy and completeness, and I agree with the above.  Sarina Ser, MD

## 2020-07-09 ENCOUNTER — Encounter: Payer: Self-pay | Admitting: Dermatology

## 2020-08-18 DIAGNOSIS — Z1231 Encounter for screening mammogram for malignant neoplasm of breast: Secondary | ICD-10-CM | POA: Diagnosis not present

## 2020-08-23 ENCOUNTER — Other Ambulatory Visit: Payer: Self-pay | Admitting: Obstetrics & Gynecology

## 2020-08-23 DIAGNOSIS — R928 Other abnormal and inconclusive findings on diagnostic imaging of breast: Secondary | ICD-10-CM

## 2020-09-01 ENCOUNTER — Ambulatory Visit
Admission: RE | Admit: 2020-09-01 | Discharge: 2020-09-01 | Disposition: A | Discharge status: Home / Self Care | Payer: BC Managed Care – PPO | Source: Ambulatory Visit | Attending: Obstetrics & Gynecology | Admitting: Obstetrics & Gynecology

## 2020-09-01 ENCOUNTER — Other Ambulatory Visit: Payer: Self-pay

## 2020-09-01 DIAGNOSIS — R928 Other abnormal and inconclusive findings on diagnostic imaging of breast: Secondary | ICD-10-CM | POA: Diagnosis not present

## 2020-09-01 DIAGNOSIS — N6489 Other specified disorders of breast: Secondary | ICD-10-CM | POA: Diagnosis not present

## 2020-09-01 DIAGNOSIS — N6002 Solitary cyst of left breast: Secondary | ICD-10-CM | POA: Diagnosis not present

## 2020-09-01 DIAGNOSIS — R922 Inconclusive mammogram: Secondary | ICD-10-CM | POA: Diagnosis not present

## 2020-09-21 ENCOUNTER — Telehealth: Payer: Self-pay | Admitting: *Deleted

## 2020-09-21 ENCOUNTER — Encounter: Payer: Self-pay | Admitting: Nurse Practitioner

## 2020-09-21 ENCOUNTER — Telehealth (INDEPENDENT_AMBULATORY_CARE_PROVIDER_SITE_OTHER): Payer: PPO | Admitting: Nurse Practitioner

## 2020-09-21 VITALS — BP 94/71 | Temp 99.4°F | Wt 116.0 lb

## 2020-09-21 DIAGNOSIS — U071 COVID-19: Secondary | ICD-10-CM | POA: Insufficient documentation

## 2020-09-21 MED ORDER — NIRMATRELVIR/RITONAVIR (PAXLOVID) TABLET (RENAL DOSING): 2.0000 | ORAL_TABLET | Freq: Two times a day (BID) | ORAL | 0 refills | Status: AC

## 2020-09-21 NOTE — Progress Notes (Signed)
Patient ID: Terri Espinoza, female    DOB: Jul 30, 1955, 65 y.o.   MRN: [id number]  Virtual visit completed through [place], a video enabled telemedicine application. Due to national recommendations of social distancing due to COVID-19, a virtual visit is felt to be most appropriate for this patient at this time. Reviewed limitations, risks, security and privacy concerns of performing a virtual visit and the availability of in person appointments. I also reviewed that there may be a patient responsible charge related to this service. The patient agreed to proceed.   Patient location: home Provider location: [place] [facility], office Persons participating in this virtual visit: patient, provider   If any vitals were documented, they were collected by patient at home unless specified below.    BP 94/71 Comment: per patient  Temp 99.4 F (37.4 C) Comment: per patient  Wt 116 lb (52.6 kg) Comment: per patient  BMI 24.24 kg/m    CC: Covid 19 Infection  Subjective:   HPI: Terri Espinoza is a 65 y.o. female presenting on 09/21/2020 for positive at-home COVID test.  Patient started having symptoms on 09-20-2020.  Took a home test today tested positive.  Has symptoms of headache, chills, myalgias, cough, sore throat, fatigue, ear fullness.  She has been vaccinated with [employer] x2 and 1 booster.  Was also a former smoker quit approximately 30-40 years ago also sees nephrology in regards to chronic kidney disease.  Patient has been using over-the-counter Tylenol for symptom management and has had adequate effect.  Patient states she is also staying adequately hydrated.       Relevant past medical, surgical, family and social history reviewed and updated as indicated. Interim medical history since our last visit reviewed. Allergies and medications reviewed and updated. Outpatient Medications Prior to Visit  Medication Sig Dispense Refill   Calcium-Magnesium-Vitamin D (CALCIUM 500 PO)  Take by mouth.     cholecalciferol (VITAMIN D) 1000 UNITS tablet Take 5,000 Units by mouth daily.     estradiol (ESTRACE) 2 MG tablet Take 2 mg by mouth daily.     fluticasone (FLONASE) 50 MCG/ACT nasal spray Place 2 sprays into both nostrils daily. 16 g 6   lisinopril (ZESTRIL) 2.5 MG tablet Take 2.5 mg by mouth daily.     Multiple Vitamin (MULTIVITAMIN) tablet Take 1 tablet by mouth daily.     No facility-administered medications prior to visit.     Per HPI unless specifically indicated in ROS section below Review of Systems  Constitutional:  Positive for chills, fatigue and fever.  HENT:  Positive for ear pain (ear fullness), sinus pressure and sore throat.   Respiratory:  Positive for cough. Negative for shortness of breath and wheezing.   Cardiovascular:  Negative for chest pain.  Gastrointestinal:  Negative for abdominal pain, diarrhea, nausea and vomiting.  Musculoskeletal:  Positive for myalgias.  Neurological:  Positive for headaches.  Objective:  BP 94/71 Comment: per patient  Temp 99.4 F (37.4 C) Comment: per patient  Wt 116 lb (52.6 kg) Comment: per patient  BMI 24.24 kg/m   Wt Readings from Last 3 Encounters:  09/21/20 116 lb (52.6 kg)  03/30/20 116 lb 8 oz (52.8 kg)  09/28/19 126 lb 8 oz (57.4 kg)       Physical exam: Gen: alert, NAD, not ill appearing Pulm: speaks in complete sentences without increased work of breathing Psych: normal mood, normal thought content      Results for orders placed or performed in  visit on 03/30/20  TSH  Result Value Ref Range   TSH 5.92 (H) 0.35 - 4.50 uIU/mL  T4, free  Result Value Ref Range   Free T4 0.72 0.60 - 1.60 ng/dL  Lipid panel  Result Value Ref Range   Cholesterol 228 (H) 0 - 200 mg/dL   Triglycerides 150.0 (H) 0.0 - 149.0 mg/dL   HDL 66.50 >39.00 mg/dL   VLDL 30.0 0.0 - 40.0 mg/dL   LDL Cholesterol 132 (H) 0 - 99 mg/dL   Total CHOL/HDL Ratio 3    NonHDL 161.59    Assessment & Plan:   Problem List Items  Addressed This Visit       Other   COVID-19 virus infection - Primary    His risk factors of age, chronic kidney disease, and former smoker we will treat with Paxlovid.  Given patient's decreased GFR she will be renally dosed.  Last GFR on file was 49.  Patient is to continue adequate hydration and using Tylenol over-the-counter as directed for symptomatic treatment.  Signs and symptoms reviewed with patient is when to seek urgent or emergent health care.  She acknowledged.  CDC guidelines reviewed with patient also about quarantine. Start Paxlovid as soon as possible.       Relevant Medications   nirmatrelvir/ritonavir EUA, renal dosing, (PAXLOVID) TABS     No orders of the defined types were placed in this encounter.  No orders of the defined types were placed in this encounter.   I discussed the assessment and treatment plan with the patient. The patient was provided an opportunity to ask questions and all were answered. The patient agreed with the plan and demonstrated an understanding of the instructions. The patient was advised to call back or seek an in-person evaluation if the symptoms worsen or if the condition fails to improve as anticipated.  Follow up plan: No follow-ups on file.  Romilda Garret, NP

## 2020-09-21 NOTE — Assessment & Plan Note (Signed)
His risk factors of age, chronic kidney disease, and former smoker we will treat with Paxlovid.  Given patient's decreased GFR she will be renally dosed.  Last GFR on file was 49.  Patient is to continue adequate hydration and using Tylenol over-the-counter as directed for symptomatic treatment.  Signs and symptoms reviewed with patient is when to seek urgent or emergent health care.  She acknowledged.  CDC guidelines reviewed with patient also about quarantine. Start Paxlovid as soon as possible.

## 2020-09-21 NOTE — Telephone Encounter (Addendum)
Patient called stating that she started feeling bad yesterday.  Patient stated that she did a home covid test today and it was positive. Patient stated that she has a scratch throat, chills, body aches and nasal congestion. Patient denies SOB or difficulty breathing. Patient scheduled for a virtual visit today  09/21/20 with Romilda Garret NP at 4:00 pm. Patient was given ER precautions and she verbalized understanding.

## 2020-09-21 NOTE — Patient Instructions (Signed)
Pleasure meeting you today Take medication as prescribed Follow up as scheduled sooner if needed

## 2020-09-21 NOTE — Telephone Encounter (Signed)
Patient called stating that she did a virtual visit with Romilda Garret NP today and was prescribed a medication. Patient stated that [facility] does not have the medication in stock and requested that it be sent to another pharmacy. Patient was advised that she would need to shop around and find a pharmacy that has the medication in stock. Patient was advised when she finds a pharmacy that has the medication she can request that they have it transferred to their pharmacy. Patient was given the name of the medication that was prescribed.

## 2020-09-22 NOTE — Telephone Encounter (Signed)
Spoke with patient. Patient was able to find Paxlovid at [facility] pharmacy. Nothing else is needed at this time.

## 2021-01-16 DIAGNOSIS — N1831 Chronic kidney disease, stage 3a: Secondary | ICD-10-CM | POA: Diagnosis not present

## 2021-01-16 DIAGNOSIS — R809 Proteinuria, unspecified: Secondary | ICD-10-CM | POA: Diagnosis not present

## 2021-01-16 DIAGNOSIS — I10 Essential (primary) hypertension: Secondary | ICD-10-CM | POA: Diagnosis not present

## 2021-01-16 DIAGNOSIS — R8290 Unspecified abnormal findings in urine: Secondary | ICD-10-CM | POA: Diagnosis not present

## 2021-01-19 DIAGNOSIS — I10 Essential (primary) hypertension: Secondary | ICD-10-CM | POA: Diagnosis not present

## 2021-01-19 DIAGNOSIS — R809 Proteinuria, unspecified: Secondary | ICD-10-CM | POA: Diagnosis not present

## 2021-01-19 DIAGNOSIS — N1831 Chronic kidney disease, stage 3a: Secondary | ICD-10-CM | POA: Diagnosis not present

## 2021-01-19 DIAGNOSIS — R8290 Unspecified abnormal findings in urine: Secondary | ICD-10-CM | POA: Diagnosis not present

## 2021-03-15 DIAGNOSIS — I129 Hypertensive chronic kidney disease with stage 1 through stage 4 chronic kidney disease, or unspecified chronic kidney disease: Secondary | ICD-10-CM | POA: Diagnosis not present

## 2021-03-15 DIAGNOSIS — Z87891 Personal history of nicotine dependence: Secondary | ICD-10-CM | POA: Diagnosis not present

## 2021-03-15 DIAGNOSIS — M8580 Other specified disorders of bone density and structure, unspecified site: Secondary | ICD-10-CM | POA: Diagnosis not present

## 2021-03-15 DIAGNOSIS — N182 Chronic kidney disease, stage 2 (mild): Secondary | ICD-10-CM | POA: Diagnosis not present

## 2021-03-15 DIAGNOSIS — J309 Allergic rhinitis, unspecified: Secondary | ICD-10-CM | POA: Diagnosis not present

## 2021-03-15 DIAGNOSIS — G620 Drug-induced polyneuropathy: Secondary | ICD-10-CM | POA: Diagnosis not present

## 2021-03-15 DIAGNOSIS — Z85820 Personal history of malignant melanoma of skin: Secondary | ICD-10-CM | POA: Diagnosis not present

## 2021-03-15 DIAGNOSIS — I10 Essential (primary) hypertension: Secondary | ICD-10-CM | POA: Diagnosis not present

## 2021-03-15 DIAGNOSIS — N959 Unspecified menopausal and perimenopausal disorder: Secondary | ICD-10-CM | POA: Diagnosis not present

## 2021-04-30 ENCOUNTER — Telehealth: Payer: Self-pay | Admitting: Family Medicine

## 2021-04-30 DIAGNOSIS — E039 Hypothyroidism, unspecified: Secondary | ICD-10-CM

## 2021-04-30 DIAGNOSIS — E7841 Elevated Lipoprotein(a): Secondary | ICD-10-CM

## 2021-04-30 DIAGNOSIS — I10 Essential (primary) hypertension: Secondary | ICD-10-CM

## 2021-04-30 NOTE — Telephone Encounter (Signed)
Lab order for cpe  ?

## 2021-05-01 ENCOUNTER — Other Ambulatory Visit: Payer: PPO

## 2021-05-01 ENCOUNTER — Other Ambulatory Visit: Payer: Self-pay

## 2021-05-01 ENCOUNTER — Telehealth (INDEPENDENT_AMBULATORY_CARE_PROVIDER_SITE_OTHER): Payer: PPO | Admitting: Family Medicine

## 2021-05-01 ENCOUNTER — Encounter: Payer: Self-pay | Admitting: Family Medicine

## 2021-05-01 DIAGNOSIS — J069 Acute upper respiratory infection, unspecified: Secondary | ICD-10-CM

## 2021-05-01 DIAGNOSIS — J019 Acute sinusitis, unspecified: Secondary | ICD-10-CM | POA: Insufficient documentation

## 2021-05-01 MED ORDER — BENZONATATE 200 MG PO CAPS: 200.0000 mg | ORAL_CAPSULE | Freq: Three times a day (TID) | ORAL | 1 refills | Status: DC | PRN

## 2021-05-01 NOTE — Patient Instructions (Signed)
Continue your allergy medicines ?Try the Nettie pot for congestion and breathe steam when he can ?Continue Delsym for cough as needed ?Try the [facility] for cough as well ?Please watch for green and yellow mucus, worse sinus pain, wheezing or shortness of breath and let us know ?Also if temperature goes up please let us know ?Tylenol is okay and lots of fluids and rest ? ?Update if not starting to improve in a week or if worsening   ?

## 2021-05-01 NOTE — Assessment & Plan Note (Signed)
Day 5-6 ?Some low grade fever ?No wheeze or sob and mucous is clear  ?Disc symptom control ?Delsym and tylenol  ?Fluids ?Add nasal saline /netti pot and steam  ?Rest/fluids  ?Continue antihistamine and flonase  ?Watch for wheeze/sob/green or yellow mucous or more sinus pain  ?Update if not starting to improve in a week or if worsening  ?ER precautions reviewed  ?

## 2021-05-01 NOTE — Progress Notes (Signed)
Virtual Visit via Video Note ? ?I connected with Terri Espinoza on 05/01/21 at 12:00 PM EDT by a video enabled telemedicine application and verified that I am speaking with the correct person using two identifiers. ? ?Location: ?Patient: home ?Provider: office  ?  ?I discussed the limitations of evaluation and management by telemedicine and the availability of in person appointments. The patient expressed understanding and agreed to proceed. ? ?Parties involved in encounter ? ?Patient: Terri Espinoza ? ?Provider:  Loura Pardon MD  ? ?History of Present Illness: ?Pt presents for uri symptoms  ?Symptoms started 5-6 days ago  ?Scratchy throat then sneezing  ?Nasal congestion with clear mucous  ?Fever: 99.6 today ?Was up to 100.7  ?No more body aches  ?Cough : dry cough  ?No wheeze ?No sob  ? ? ?Otc ?Mucinex DM  ?Delsym  ?Tylenol  ?Claritin/allegra   (zyrtec makes her too sleepy)  ?Flonase  ? ?2 covid tests were negative  ? ?No nsaids due to kidneys  ? ?  ?Patient Active Problem List  ? Diagnosis Date Noted  ? Viral URI with cough 05/01/2021  ? COVID-19 virus infection 09/21/2020  ? Decreased GFR 09/28/2019  ? Prolapsed internal hemorrhoids, grade 2 09/07/2013  ? Generalized anxiety disorder 03/05/2013  ? Hypothyroidism 04/24/2012  ? HLD (hyperlipidemia) 04/24/2012  ? INCONTINENCE, URGE 12/19/2009  ? Allergic rhinitis 08/08/2009  ? DEPRESSION, MILD 01/29/2007  ? Essential hypertension 01/29/2007  ? ?Past Medical History:  ?Diagnosis Date  ? Allergic rhinitis   ? Allergy   ? Anal fissure   ? Anxiety   ? Arthritis   ? Basal cell carcinoma 06/22/2020  ? Right prox lat calf near knee - EDC 07/05/20  ? Cancer [facility])   ? melanoma on back and upper abdomen  BCC also multiple  ? Dysplastic nevus 09/15/2019  ? R mid medial scapula, moderate to severe, shave removal  ? Hemorrhoids   ? HLD (hyperlipidemia)   ? HTN (hypertension)   ? Hx of basal cell carcinoma 01/11/2006  ? chest  ? Hx of basal cell carcinoma 12/08/2009  ? L  anticubital  ? Hx of basal cell carcinoma 12/29/2013  ? L proximal antecubital  ? Hx of basal cell carcinoma 12/23/2014  ? R low back  ? Hx of dysplastic nevus 01/24/2007  ? R upper back, moderate atypia  ? Hx of dysplastic nevus 06/22/2014  ? R post medial mid thigh  ? Hx of dysplastic nevus 12/06/2014  ? R post waistline, mild atypia  ? Hx of dysplastic nevus 12/06/2014  ? R sup lat buttocks, mild atypia  ? Hypothyroidism   ? pt. denies  ? Melanoma ([place]) 12/29/2009  ? R mid back, Melanoma IS  ? Osteopenia   ? Uterine fibroid   ? ?Past Surgical History:  ?Procedure Laterality Date  ? ABDOMINAL HYSTERECTOMY  1995  ? partial, fibroids, Dr. Nori Riis  ? COLONOSCOPY  2009  ? FLEXIBLE SIGMOIDOSCOPY  2002  ? HEMORRHOID BANDING    ? skin cancer removal    ? melanoma upper abdomen/back   BCC multiple   ? URETHRAL DILATION  1988  ? Dr. Leory Plowman  ? ?Social History  ? ?Tobacco Use  ? Smoking status: Former  ? Smokeless tobacco: Never  ? Tobacco comments:  ?  quit 25 years ago  ?Vaping Use  ? Vaping Use: Never used  ?Substance Use Topics  ? Alcohol use: No  ? Drug use: No  ? ?Family History  ?Problem Relation  Age of Onset  ? Hypertension Mother   ? Hyperlipidemia Mother   ? Alcohol abuse Father   ? Cirrhosis Father   ? Colon polyps Father   ? Colon polyps Sister   ? Hypertension Sister   ? Hypertension Maternal Aunt   ? Lung cancer Paternal Aunt   ? Colon cancer Neg Hx   ? Esophageal cancer Neg Hx   ? Stomach cancer Neg Hx   ? Rectal cancer Neg Hx   ? ?No Known Allergies ?Current Outpatient Medications on File Prior to Visit  ?Medication Sig Dispense Refill  ? acetaminophen (TYLENOL) 325 MG tablet Take 650 mg by mouth every 6 (six) hours as needed.    ? Calcium-Magnesium-Vitamin D (CALCIUM 500 PO) Take by mouth.    ? cholecalciferol (VITAMIN D) 1000 UNITS tablet Take 5,000 Units by mouth daily.    ? Dextromethorphan-guaiFENesin ([place] DM PO) Take by mouth.    ? estradiol (ESTRACE) 2 MG tablet Take 2 mg by mouth daily.    ?  fexofenadine-pseudoephedrine (ALLEGRA-D 24) 180-240 MG 24 hr tablet Take 1 tablet by mouth daily.    ? fluticasone (FLONASE) 50 MCG/ACT nasal spray Place 2 sprays into both nostrils daily. 16 g 6  ? lisinopril (ZESTRIL) 2.5 MG tablet Take 2.5 mg by mouth daily.    ? loratadine (CLARITIN) 5 MG chewable tablet Chew 5 mg by mouth daily.    ? Multiple Vitamin (MULTIVITAMIN) tablet Take 1 tablet by mouth daily.    ? ?No current facility-administered medications on file prior to visit.  ? Review of Systems  ?Constitutional:  Positive for fever and malaise/fatigue. Negative for chills.  ?      Low grade temp  ?HENT:  Positive for congestion. Negative for ear pain, sinus pain and sore throat.   ?     Sinus pressure   ?Eyes:  Negative for blurred vision, discharge and redness.  ?Respiratory:  Positive for cough. Negative for sputum production, shortness of breath, wheezing and stridor.   ?Cardiovascular:  Negative for chest pain, palpitations and leg swelling.  ?Gastrointestinal:  Negative for abdominal pain, diarrhea, nausea and vomiting.  ?Musculoskeletal:  Negative for myalgias.  ?Skin:  Negative for rash.  ?Neurological:  Positive for headaches. Negative for dizziness.   ?Observations/Objective: ?Patient appears well, in no distress ?Weight is baseline  ?No facial swelling or asymmetry ?Normal voice-not hoarse and no slurred speech ?No obvious tremor or mobility impairment ?Moving neck and UEs normally ?Able to hear the call well  ?No wheeze or shortness of breath during interview  ?Cough sounds dry and hacking ?Talkative and mentally sharp with no cognitive changes ?No skin changes on face or neck , no rash or pallor ?Affect is normal  ? ? ?Assessment and Plan: ?Problem List Items Addressed This Visit   ? ?  ? Respiratory  ? Viral URI with cough  ?  Day 5-6 ?Some low grade fever ?No wheeze or sob and mucous is clear  ?Disc symptom control ?Delsym and tylenol  ?Fluids ?Add nasal saline /netti pot and steam  ?Rest/fluids   ?Continue antihistamine and flonase  ?Watch for wheeze/sob/green or yellow mucous or more sinus pain  ?Update if not starting to improve in a week or if worsening  ?ER precautions reviewed  ?  ?  ? ? ? ?Follow Up Instructions: ?Continue your allergy medicines ?Try the Netti pot for congestion and breathe steam when he can ?Continue Delsym for cough as needed ?Try the [place]ona Ridgel for  cough as well ?Please watch for green and yellow mucus, worse sinus pain, wheezing or shortness of breath and let us know ?Also if temperature goes up please let us know ?Tylenol is okay and lots of fluids and rest ? ?Update if not starting to improve in a week or if worsening  ?  ?I discussed the assessment and treatment plan with the patient. The patient was provided an opportunity to ask questions and all were answered. The patient agreed with the plan and demonstrated an understanding of the instructions. ?  ?The patient was advised to call back or seek an in-person evaluation if the symptoms worsen or if the condition fails to improve as anticipated. ? ? ? ? ?Loura Pardon, MD ? ?

## 2021-05-05 ENCOUNTER — Other Ambulatory Visit: Payer: Self-pay

## 2021-05-05 ENCOUNTER — Encounter: Payer: Self-pay | Admitting: Family Medicine

## 2021-05-05 ENCOUNTER — Ambulatory Visit (INDEPENDENT_AMBULATORY_CARE_PROVIDER_SITE_OTHER): Payer: PPO | Admitting: Family Medicine

## 2021-05-05 VITALS — BP 110/70 | HR 89 | Temp 98.2°F | Ht <= 58 in | Wt 119.1 lb

## 2021-05-05 DIAGNOSIS — J0100 Acute maxillary sinusitis, unspecified: Secondary | ICD-10-CM | POA: Diagnosis not present

## 2021-05-05 DIAGNOSIS — N182 Chronic kidney disease, stage 2 (mild): Secondary | ICD-10-CM | POA: Diagnosis not present

## 2021-05-05 DIAGNOSIS — E7841 Elevated Lipoprotein(a): Secondary | ICD-10-CM

## 2021-05-05 DIAGNOSIS — I10 Essential (primary) hypertension: Secondary | ICD-10-CM | POA: Diagnosis not present

## 2021-05-05 DIAGNOSIS — E039 Hypothyroidism, unspecified: Secondary | ICD-10-CM | POA: Diagnosis not present

## 2021-05-05 DIAGNOSIS — Z0000 Encounter for general adult medical examination without abnormal findings: Secondary | ICD-10-CM

## 2021-05-05 DIAGNOSIS — R944 Abnormal results of kidney function studies: Secondary | ICD-10-CM

## 2021-05-05 LAB — CBC WITH DIFFERENTIAL/PLATELET
Basophils Absolute: 0.1 10*3/uL (ref 0.0–0.1)
Basophils Relative: 0.9 % (ref 0.0–3.0)
Eosinophils Absolute: 0.1 10*3/uL (ref 0.0–0.7)
Eosinophils Relative: 1.2 % (ref 0.0–5.0)
HCT: 33.6 % — ABNORMAL LOW (ref 36.0–46.0)
Hemoglobin: 11.2 g/dL — ABNORMAL LOW (ref 12.0–15.0)
Lymphocytes Relative: 13.7 % (ref 12.0–46.0)
Lymphs Abs: 1.1 10*3/uL (ref 0.7–4.0)
MCHC: 33.3 g/dL (ref 30.0–36.0)
MCV: 94 fl (ref 78.0–100.0)
Monocytes Absolute: 0.8 10*3/uL (ref 0.1–1.0)
Monocytes Relative: 10.2 % (ref 3.0–12.0)
Neutro Abs: 6 10*3/uL (ref 1.4–7.7)
Neutrophils Relative %: 74 % (ref 43.0–77.0)
Platelets: 244 10*3/uL (ref 150.0–400.0)
RBC: 3.58 Mil/uL — ABNORMAL LOW (ref 3.87–5.11)
RDW: 12.2 % (ref 11.5–15.5)
WBC: 8.1 10*3/uL (ref 4.0–10.5)

## 2021-05-05 LAB — COMPREHENSIVE METABOLIC PANEL
ALT: 12 U/L (ref 0–35)
AST: 20 U/L (ref 0–37)
Albumin: 3.9 g/dL (ref 3.5–5.2)
Alkaline Phosphatase: 65 U/L (ref 39–117)
BUN: 15 mg/dL (ref 6–23)
CO2: 29 mEq/L (ref 19–32)
Calcium: 8.7 mg/dL (ref 8.4–10.5)
Chloride: 103 mEq/L (ref 96–112)
Creatinine, Ser: 0.91 mg/dL (ref 0.40–1.20)
GFR: 66.09 mL/min (ref >60.00)
Glucose, Bld: 83 mg/dL (ref 70–99)
Potassium: 4.3 mEq/L (ref 3.5–5.1)
Sodium: 138 mEq/L (ref 135–145)
Total Bilirubin: 0.3 mg/dL (ref 0.2–1.2)
Total Protein: 6.7 g/dL (ref 6.0–8.3)

## 2021-05-05 LAB — LIPID PANEL
Cholesterol: 194 mg/dL (ref 0–200)
HDL: 53.8 mg/dL (ref >39.00)
LDL Cholesterol: 109 mg/dL — ABNORMAL HIGH (ref 0–99)
NonHDL: 140.25
Total CHOL/HDL Ratio: 4
Triglycerides: 155 mg/dL — ABNORMAL HIGH (ref 0.0–149.0)
VLDL: 31 mg/dL (ref 0.0–40.0)

## 2021-05-05 MED ORDER — AMOXICILLIN-POT CLAVULANATE 875-125 MG PO TABS: 1.0000 | ORAL_TABLET | Freq: Two times a day (BID) | ORAL | 0 refills | Status: DC

## 2021-05-05 NOTE — Patient Instructions (Addendum)
Work on advance directive and get it notarized  ? ?Make an appt for a nurse visit for pneumonia vaccine in about a month  ? ?Take care of yourself  ?Keep using sun protection  ?Labs today  ? ?Follow up with gyn  ? ? ? ? ?

## 2021-05-05 NOTE — Assessment & Plan Note (Signed)
Now purulent nasal mucous and sinus/teeth pain  ?Cough is improved ?Px augmentin  ?Update if not starting to improve in a week or if worsening   ? ?Meds ordered this encounter  ?Medications  ?? amoxicillin-clavulanate (AUGMENTIN) 875-125 MG tablet  ?  Sig: Take 1 tablet by mouth 2 (two) times daily.  ?  Dispense:  14 tablet  ?  Refill:  0  ? ? ?

## 2021-05-05 NOTE — Progress Notes (Signed)
? ?Subjective:  ? ? Patient ID: Terri Espinoza, female    DOB: Jun 15, 1955, 66 y.o.   MRN: [id number] ? ?This visit occurred during the SARS-CoV-2 public health emergency.  Safety protocols were in place, including screening questions prior to the visit, additional usage of staff PPE, and extensive cleaning of exam room while observing appropriate contact time as indicated for disinfecting solutions.  ? ?HPI ?Pt presents for a welcome to medicare visit ? ?Wt Readings from Last 3 Encounters:  ?05/05/21 119 lb 2 oz (54 kg)  ?05/01/21 118 lb 3.2 oz (53.6 kg)  ?09/21/20 116 lb (52.6 kg)  ? ?24.90 kg/m? ? ?I have personally reviewed the Medicare Annual Wellness questionnaire and have noted ?1. The patient's medical and social history ?2. Their use of alcohol, tobacco or illicit drugs ?3. Their current medications and supplements ?4. The patient's functional ability including ADL's, fall risks, home safety risks and hearing or visual ?            impairment. ?5. Diet and physical activities ?6. Evidence for depression or mood disorders ? ?The patients weight, height, BMI have been recorded in the chart and visual acuity is per eye clinic.  ?I have made referrals, counseling and provided education to the patient based review of the above and I have provided the pt with a written personalized care plan for preventive services. ?Reviewed and updated provider list, see scanned forms. ? ?See scanned forms.  Routine anticipatory guidance given to patient.  See health maintenance. ?Colon cancer screening  colonoscopy 03/2017  ?Breast cancer screening mammogram 08/2020, will get next in 2023  ?Goes to gyn  ?Self breast exam-no lumps  ?Flu vaccine : had one in sept ?Tetanus vaccine : 2010  ?Pneumovax: -next time since sick today  ?Zoster vaccine shingrix 2018  ?Dexa- scheduled for April 19th   (had one long time ago , osteopenia in hips)  ?Falls- none ?Fractures-none  ?Supplements  ca and D  ?Exercise - gym , silver sneaker classes  and walking    15,000 to 20,000 steps per day  ? ? ?Advance directive- given materials to work on  ?Cognitive function addressed- see scanned forms- and if abnormal then additional documentation follows.  ? ?No issues with memory or cognition at all  ? ?PMH and SH reviewed ? ?Meds, vitals, and allergies reviewed.  ? ?ROS: See HPI.  Otherwise negative.   ? ?Weight : ?Wt Readings from Last 3 Encounters:  ?05/05/21 119 lb 2 oz (54 kg)  ?05/01/21 118 lb 3.2 oz (53.6 kg)  ?09/21/20 116 lb (52.6 kg)  ? ? ? ?Hearing/vision:   ?Hearing Screening  ? _0  _1  _2  _3   ?Right ear 40 40 40 0  ?Left ear 40 0 40 0  ? ?Vision Screening  ? Right eye Left eye Both eyes  ?Without correction     ?With correction _4  ? ?Ears are stopped up /uri  ? ? ?PHQ: ? ?  05/05/2021  ?  9:57 AM 05/01/2021  ? 12:08 PM 10/22/2016  ?  2:45 PM 12/30/2012  ?  2:40 PM  ?Depression screen PHQ 2/9  ?Decreased Interest 0 0 0 0  ?Down, Depressed, Hopeless 0 0 0 0  ?PHQ - 2 Score 0 0 0 0  ? ? ? ?ADLs: no help needed  ? ?Functionality: great  ? ?Care team  ? ?Cold is worse/now sinus pain and green nasal drainage  ? ?HTN ?bp is stable today  ?No cp  or palpitations or headaches or edema  ?No side effects to medicines  ?BP Readings from Last 3 Encounters:  ?05/05/21 110/70  ?05/01/21 106/80  ?09/21/20 94/71  ?  Lisinopril 2.5 mg daily  ? ? ?Lab Results  ?Component Value Date  ? CREATININE 1.11 11/09/2019  ? BUN 15 11/09/2019  ? NA 139 11/09/2019  ? K 4.1 11/09/2019  ? CL 104 11/09/2019  ? CO2 28 11/09/2019  ?H/o decreased GFR (CKD stage 2/3a)  ?Under care of nephrology  ? ? ?Hypothyroidism subclinical  ?Pt has no clinical changes ?No change in energy level/ hair or skin/ edema and no tremor ?Lab Results  ?Component Value Date  ? TSH 5.92 (H) 03/30/2020  ?   ?Mom has hypothyroidism and multiple myeloma  ? ? ?Hyperlipidemia ?Lab Results  ?Component Value Date  ? CHOL 228 (H) 03/30/2020  ? HDL 66.50 03/30/2020  ? LDLCALC 132 (H) 03/30/2020  ?  LDLDIRECT 149.9 12/30/2012  ? TRIG 150.0 (H) 03/30/2020  ? CHOLHDL 3 03/30/2020  ? ?Due for labs  ? ?Diet has not been great when sick but prior was doing well  ? ?Some fried food and red meat  ?High cholesterol runs in family  ? ?Patient Active Problem List  ? Diagnosis Date Noted  ? Welcome to Medicare preventive visit 05/07/2021  ? Acute sinusitis 05/01/2021  ? CKD (chronic kidney disease) stage 2, GFR 60-89 ml/min 09/28/2019  ? Prolapsed internal hemorrhoids, grade 2 09/07/2013  ? Generalized anxiety disorder 03/05/2013  ? Hypothyroidism 04/24/2012  ? HLD (hyperlipidemia) 04/24/2012  ? INCONTINENCE, URGE 12/19/2009  ? Allergic rhinitis 08/08/2009  ? DEPRESSION, MILD 01/29/2007  ? Essential hypertension 01/29/2007  ? ?Past Medical History:  ?Diagnosis Date  ? Allergic rhinitis   ? Allergy   ? Anal fissure   ? Anxiety   ? Arthritis   ? Basal cell carcinoma 06/22/2020  ? Right prox lat calf near knee - EDC 07/05/20  ? Cancer [facility])   ? melanoma on back and upper abdomen  BCC also multiple  ? Dysplastic nevus 09/15/2019  ? R mid medial scapula, moderate to severe, shave removal  ? Hemorrhoids   ? HLD (hyperlipidemia)   ? HTN (hypertension)   ? Hx of basal cell carcinoma 01/11/2006  ? chest  ? Hx of basal cell carcinoma 12/08/2009  ? L anticubital  ? Hx of basal cell carcinoma 12/29/2013  ? L proximal antecubital  ? Hx of basal cell carcinoma 12/23/2014  ? R low back  ? Hx of dysplastic nevus 01/24/2007  ? R upper back, moderate atypia  ? Hx of dysplastic nevus 06/22/2014  ? R post medial mid thigh  ? Hx of dysplastic nevus 12/06/2014  ? R post waistline, mild atypia  ? Hx of dysplastic nevus 12/06/2014  ? R sup lat buttocks, mild atypia  ? Hypothyroidism   ? pt. denies  ? Melanoma ([place]) 12/29/2009  ? R mid back, Melanoma IS  ? Osteopenia   ? Uterine fibroid   ? ?Past Surgical History:  ?Procedure Laterality Date  ? ABDOMINAL HYSTERECTOMY  1995  ? partial, fibroids, Dr. Nori Riis  ? COLONOSCOPY  2009  ? FLEXIBLE  SIGMOIDOSCOPY  2002  ? HEMORRHOID BANDING    ? skin cancer removal    ? melanoma upper abdomen/back   BCC multiple   ? URETHRAL DILATION  1988  ? Dr. Leory Plowman  ? ?Social History  ? ?Tobacco Use  ? Smoking status: Former  ? Smokeless tobacco: Never  ?  Tobacco comments:  ?  quit 25 years ago  ?Vaping Use  ? Vaping Use: Never used  ?Substance Use Topics  ? Alcohol use: No  ? Drug use: No  ? ?Family History  ?Problem Relation Age of Onset  ? Hypertension Mother   ? Hyperlipidemia Mother   ? Alcohol abuse Father   ? Cirrhosis Father   ? Colon polyps Father   ? Colon polyps Sister   ? Hypertension Sister   ? Hypertension Maternal Aunt   ? Lung cancer Paternal Aunt   ? Colon cancer Neg Hx   ? Esophageal cancer Neg Hx   ? Stomach cancer Neg Hx   ? Rectal cancer Neg Hx   ? ?No Known Allergies ?Current Outpatient Medications on File Prior to Visit  ?Medication Sig Dispense Refill  ? acetaminophen (TYLENOL) 325 MG tablet Take 650 mg by mouth every 6 (six) hours as needed.    ? benzonatate (TESSALON) 200 MG capsule Take 1 capsule (200 mg total) by mouth 3 (three) times daily as needed for cough. Swallow pill , do not bite 30 capsule 1  ? Calcium-Magnesium-Vitamin D (CALCIUM 500 PO) Take by mouth.    ? cholecalciferol (VITAMIN D) 1000 UNITS tablet Take 5,000 Units by mouth daily.    ? Dextromethorphan-guaiFENesin ([place] DM PO) Take by mouth.    ? estradiol (ESTRACE) 2 MG tablet Take 2 mg by mouth daily.    ? fexofenadine-pseudoephedrine (ALLEGRA-D 24) 180-240 MG 24 hr tablet Take 1 tablet by mouth daily.    ? fluticasone (FLONASE) 50 MCG/ACT nasal spray Place 2 sprays into both nostrils daily. 16 g 6  ? lisinopril (ZESTRIL) 2.5 MG tablet Take 2.5 mg by mouth daily.    ? loratadine (CLARITIN) 5 MG chewable tablet Chew 5 mg by mouth daily.    ? Multiple Vitamin (MULTIVITAMIN) tablet Take 1 tablet by mouth daily.    ? ?No current facility-administered medications on file prior to visit.  ?  ?Review of Systems  ?Constitutional:   Negative for activity change, appetite change, fatigue, fever and unexpected weight change.  ?HENT:  Positive for congestion, sinus pressure and sinus pain. Negative for ear pain, rhinorrhea and sore throat.   ?

## 2021-05-07 DIAGNOSIS — Z0000 Encounter for general adult medical examination without abnormal findings: Secondary | ICD-10-CM | POA: Insufficient documentation

## 2021-05-07 NOTE — Assessment & Plan Note (Signed)
Hypothyroidism , subclinical, no medications currently ?Pt has no clinical changes ?No change in energy level/ hair or skin/ edema and no tremor ?TSH ordered  ?

## 2021-05-07 NOTE — Assessment & Plan Note (Signed)
Reviewed health habits including diet and exercise and skin cancer prevention ?Reviewed appropriate screening tests for age  ?Also reviewed health mt list, fam hx and immunization status , as well as social and family history   ?See HPI ?Labs ordered ?Colonoscopy is up-to-date from 2019 ?Mammogram up-to-date from July 2022 ?Patient sees GYN for breast and pelvic exam ?Tetanus vaccine is postponed for insurance ?We will plan pneumonia vaccine in a month went over her sinus infection ?She has a bone density test planned for April at her GYN office, no falls or fractures and taking calcium and vitamin D with good exercise ?Given materials to work on advanced directive ?No cognitive concerns ?Missed highest tone on hearing exam bilaterally but patient notes ears are stuffed up from her current sinus infection ?Normal vision screen ?PHQ score of 0 ?No help needed with ADLs and functionality is very good ?

## 2021-05-07 NOTE — Assessment & Plan Note (Signed)
Disc goals for lipids and reasons to control them ?Rev last labs with pt ?Rev low sat fat diet in detail ?Lipid panel ordered today  ?Does eat some fried food and red meat ?

## 2021-05-07 NOTE — Assessment & Plan Note (Signed)
bp in fair control at this time  ?BP Readings from Last 1 Encounters:  ?05/05/21 110/70  ? ?No changes needed ?Most recent labs reviewed  ?Disc lifstyle change with low sodium diet and exercise  ?Plan to continue lisinopril 2.5 mg daily and good habits ?

## 2021-05-07 NOTE — Assessment & Plan Note (Signed)
Pt continues nephrology care  ?Avoids nsaids ?Enc to keep fluid intake up ?

## 2021-05-09 LAB — TSH: TSH: 5.82 u[IU]/mL — ABNORMAL HIGH (ref 0.35–5.50)

## 2021-06-08 ENCOUNTER — Ambulatory Visit (INDEPENDENT_AMBULATORY_CARE_PROVIDER_SITE_OTHER): Payer: PPO

## 2021-06-08 DIAGNOSIS — Z23 Encounter for immunization: Secondary | ICD-10-CM

## 2021-06-22 ENCOUNTER — Ambulatory Visit: Payer: BC Managed Care – PPO | Admitting: Dermatology

## 2021-06-29 ENCOUNTER — Ambulatory Visit: Payer: PPO | Admitting: Dermatology

## 2021-06-29 ENCOUNTER — Encounter: Payer: Self-pay | Admitting: Dermatology

## 2021-06-29 DIAGNOSIS — Z86018 Personal history of other benign neoplasm: Secondary | ICD-10-CM

## 2021-06-29 DIAGNOSIS — D229 Melanocytic nevi, unspecified: Secondary | ICD-10-CM | POA: Diagnosis not present

## 2021-06-29 DIAGNOSIS — L821 Other seborrheic keratosis: Secondary | ICD-10-CM

## 2021-06-29 DIAGNOSIS — L578 Other skin changes due to chronic exposure to nonionizing radiation: Secondary | ICD-10-CM

## 2021-06-29 DIAGNOSIS — Z86006 Personal history of melanoma in-situ: Secondary | ICD-10-CM | POA: Diagnosis not present

## 2021-06-29 DIAGNOSIS — L814 Other melanin hyperpigmentation: Secondary | ICD-10-CM | POA: Diagnosis not present

## 2021-06-29 DIAGNOSIS — D1800 Hemangioma unspecified site: Secondary | ICD-10-CM

## 2021-06-29 DIAGNOSIS — D692 Other nonthrombocytopenic purpura: Secondary | ICD-10-CM | POA: Diagnosis not present

## 2021-06-29 DIAGNOSIS — Z85828 Personal history of other malignant neoplasm of skin: Secondary | ICD-10-CM | POA: Diagnosis not present

## 2021-06-29 DIAGNOSIS — Z1283 Encounter for screening for malignant neoplasm of skin: Secondary | ICD-10-CM

## 2021-06-29 NOTE — Progress Notes (Signed)
Follow-Up Visit   Subjective  Terri Espinoza is a 66 y.o. female who presents for the following: Annual Exam (Skin cancer screening. Full body. HxMIS, HxBCC, Hx of dysplastic nevi). The patient presents for Total-Body Skin Exam (TBSE) for skin cancer screening and mole check.  The patient has spots, moles and lesions to be evaluated, some may be new or changing and the patient has concerns that these could be cancer.  The following portions of the chart were reviewed this encounter and updated as appropriate:  Tobacco  Allergies  Meds  Problems  Med Hx  Surg Hx  Fam Hx     Review of Systems: No other skin or systemic complaints except as noted in HPI or Assessment and Plan.  Objective  Well appearing patient in no apparent distress; mood and affect are within normal limits.  A full examination was performed including scalp, head, eyes, ears, nose, lips, neck, chest, axillae, abdomen, back, buttocks, bilateral upper extremities, bilateral lower extremities, hands, feet, fingers, toes, fingernails, and toenails. All findings within normal limits unless otherwise noted below.   Assessment & Plan   History of Melanoma in Situ. Right mid back. 12/29/2009. Upper abdomen. - No evidence of recurrence today - No lymphadenopathy  - Recommend regular full body skin exams - Recommend daily broad spectrum sunscreen SPF 30+ to sun-exposed areas, reapply every 2 hours as needed.  - Call if any new or changing lesions are noted between office visits   History of Basal Cell Carcinoma of the Skin. Multiple, see history. - No evidence of recurrence today - Recommend regular full body skin exams - Recommend daily broad spectrum sunscreen SPF 30+ to sun-exposed areas, reapply every 2 hours as needed.  - Call if any new or changing lesions are noted between office visits   History of Dysplastic Nevi. Multiple, see history. - No evidence of recurrence today - Recommend regular full body skin  exams - Recommend daily broad spectrum sunscreen SPF 30+ to sun-exposed areas, reapply every 2 hours as needed.  - Call if any new or changing lesions are noted between office visits   Lentigines - Scattered tan macules - Due to sun exposure - Benign-appearing, observe - Recommend daily broad spectrum sunscreen SPF 30+ to sun-exposed areas, reapply every 2 hours as needed. - Call for any changes  Seborrheic Keratoses - Stuck-on, waxy, tan-brown papules and/or plaques  - Benign-appearing - Discussed benign etiology and prognosis. - Observe - Call for any changes  Melanocytic Nevi - Tan-brown and/or pink-flesh-colored symmetric macules and papules - Benign appearing on exam today - Observation - Call clinic for new or changing moles - Recommend daily use of broad spectrum spf 30+ sunscreen to sun-exposed areas.   Hemangiomas - Red papules - Discussed benign nature - Observe - Call for any changes  Actinic Damage - Chronic condition, secondary to cumulative UV/sun exposure - diffuse scaly erythematous macules with underlying dyspigmentation - Recommend daily broad spectrum sunscreen SPF 30+ to sun-exposed areas, reapply every 2 hours as needed.  - Staying in the shade or wearing long sleeves, sun glasses (UVA+UVB protection) and wide brim hats (4-inch brim around the entire circumference of the hat) are also recommended for sun protection.  - Call for new or changing lesions.  Skin cancer screening performed today.  Purpura - Chronic; persistent and recurrent.  Treatable, but not curable. - Violaceous macules and patches at hands - Benign - Related to trauma, age, sun damage and/or use of blood thinners,  chronic use of topical and/or oral steroids - Observe - Can use OTC arnica containing moisturizer such as Dermend Bruise Formula if desired - Call for worsening or other concerns   Return in about 1 year (around 06/30/2022) for TBSE, HxMIS, HxBCC's, HxDN.  I, Emelia Salisbury, CMA, am acting as scribe for Sarina Ser, MD. Documentation: I have reviewed the above documentation for accuracy and completeness, and I agree with the above.  Sarina Ser, MD

## 2021-06-29 NOTE — Patient Instructions (Addendum)
Melanoma ABCDEs  Melanoma is the most dangerous type of skin cancer, and is the leading cause of death from skin disease.  You are more likely to develop melanoma if you: Have light-colored skin, light-colored eyes, or red or blond hair Spend a lot of time in the sun Tan regularly, either outdoors or in a tanning bed Have had blistering sunburns, especially during childhood Have a close family member who has had a melanoma Have atypical moles or large birthmarks  Early detection of melanoma is key since treatment is typically straightforward and cure rates are extremely high if we catch it early.   The first sign of melanoma is often a change in a mole or a new dark spot.  The ABCDE system is a way of remembering the signs of melanoma.  A for asymmetry:  The two halves do not match. B for border:  The edges of the growth are irregular. C for color:  A mixture of colors are present instead of an even brown color. D for diameter:  Melanomas are usually (but not always) greater than 30m - the size of a pencil eraser. E for evolution:  The spot keeps changing in size, shape, and color.  Please check your skin once per month between visits. You can use a small mirror in front and a large mirror behind you to keep an eye on the back side or your body.   If you see any new or changing lesions before your next follow-up, please call to schedule a visit.  Please continue daily skin protection including broad spectrum sunscreen SPF 30+ to sun-exposed areas, reapplying every 2 hours as needed when you're outdoors.   Staying in the shade or wearing long sleeves, sun glasses (UVA+UVB protection) and wide brim hats (4-inch brim around the entire circumference of the hat) are also recommended for sun protection.      Seborrheic Keratosis  What causes seborrheic keratoses? Seborrheic keratoses are harmless, common skin growths that first appear during adult life.  As time goes by, more growths  appear.  Some people may develop a large number of them.  Seborrheic keratoses appear on both covered and uncovered body parts.  They are not caused by sunlight.  The tendency to develop seborrheic keratoses can be inherited.  They vary in color from skin-colored to gray, brown, or even black.  They can be either smooth or have a rough, warty surface.   Seborrheic keratoses are superficial and look as if they were stuck on the skin.  Under the microscope this type of keratosis looks like layers upon layers of skin.  That is why at times the top layer may seem to fall off, but the rest of the growth remains and re-grows.    Treatment Seborrheic keratoses do not need to be treated, but can easily be removed in the office.  Seborrheic keratoses often cause symptoms when they rub on clothing or jewelry.  Lesions can be in the way of shaving.  If they become inflamed, they can cause itching, soreness, or burning.  Removal of a seborrheic keratosis can be accomplished by freezing, burning, or surgery. If any spot bleeds, scabs, or grows rapidly, please return to have it checked, as these can be an indication of a skin cancer.   If You Need Anything After Your Visit  If you have any questions or concerns for your doctor, please call our main line at [phone] press option 4 to reach your doctor's medical assistant.  If no one answers, please leave a voicemail as directed and we will return your call as soon as possible. Messages left after 4 pm will be answered the following business day.   You may also send [place] a message via [place]. We typically respond to MyChart messages within 1-2 business days.  For prescription refills, please ask your pharmacy to contact our office. Our fax number is [phone].  If you have an urgent issue when the clinic is closed that cannot wait until the next business day, you can page your doctor at the number below.    Please note that while we do our best to be  available for urgent issues outside of office hours, we are not available 24/7.   If you have an urgent issue and are unable to reach [place], you may choose to seek medical care at your doctor's office, retail clinic, urgent care center, or emergency room.  If you have a medical emergency, please immediately call 911 or go to the emergency department.  Pager Numbers  - Dr. Nehemiah Massed: [phone]  - Dr. Laurence Ferrari: [phone]  - Dr. Nicole Kindred: [phone]  In the event of inclement weather, please call our main line at [phone] for an update on the status of any delays or closures.  Dermatology Medication Tips: Please keep the boxes that topical medications come in in order to help keep track of the instructions about where and how to use these. Pharmacies typically print the medication instructions only on the boxes and not directly on the medication tubes.   If your medication is too expensive, please contact our office at [phone] option 4 or send [place] a message through [place].   We are unable to tell what your co-pay for medications will be in advance as this is different depending on your insurance coverage. However, we may be able to find a substitute medication at lower cost or fill out paperwork to get insurance to cover a needed medication.   If a prior authorization is required to get your medication covered by your insurance company, please allow [place] 1-2 business days to complete this process.  Drug prices often vary depending on where the prescription is filled and some pharmacies may offer cheaper prices.  The website www.goodrx.com contains coupons for medications through different pharmacies. The prices here do not account for what the cost may be with help from insurance (it may be cheaper with your insurance), but the website can give you the price if you did not use any insurance.  - You can print the associated coupon and take it with your prescription to the pharmacy.  -  You may also stop by our office during regular business hours and pick up a GoodRx coupon card.  - If you need your prescription sent electronically to a different pharmacy, notify our office through [facility] or by phone at [phone] option 4.     Si Usted Necesita Algo Despus de Su Visita  Tambin puede enviarnos un mensaje a travs de Pharmacist, community. Por lo general respondemos a los mensajes de MyChart en el transcurso de 1 a 2 das hbiles.  Para renovar recetas, por favor pida a su farmacia que se ponga en contacto con nuestra oficina. Harland Dingwall de fax es [place] [phone].  Si tiene un asunto urgente cuando la clnica est cerrada y que no puede esperar hasta el siguiente da hbil, puede llamar/localizar a su doctor(a) al nmero que aparece a continuacin.   Por favor, tenga  en cuenta que aunque hacemos todo lo posible para estar disponibles para asuntos urgentes fuera del horario de [place], no estamos disponibles las 24 horas del da, los 7 das de la [place].   Si tiene un problema urgente y no puede comunicarse con nosotros, puede optar por buscar atencin mdica  en el consultorio de su doctor(a), en una clnica privada, en un centro de atencin urgente o en una sala de emergencias.  Si tiene Engineering geologist, por favor llame inmediatamente al 911 o vaya a la sala de emergencias.  Nmeros de bper  - Dr. Nehemiah Massed: [phone]  - Dra. Moye: [phone]  - Dra. Nicole Kindred: [phone]  En caso de inclemencias del [place], por favor llame a Johnsie Kindred principal al [phone] para una actualizacin sobre el [place] de cualquier retraso o cierre.  Consejos para la medicacin en dermatologa: Por favor, guarde las cajas en las que vienen los medicamentos de uso tpico para ayudarle a seguir las instrucciones sobre dnde y cmo usarlos. Las farmacias generalmente imprimen las instrucciones del medicamento slo en las cajas y no directamente en los tubos del  [place].   Si su medicamento es muy caro, por favor, pngase en contacto con Zigmund Daniel llamando al [phone] y presione la opcin 4 o envenos un mensaje a travs de Pharmacist, community.   No podemos decirle cul ser su copago por los medicamentos por adelantado ya que esto es diferente dependiendo de la cobertura de su seguro. Sin embargo, es posible que podamos encontrar un medicamento sustituto a Electrical engineer un formulario para que el seguro cubra el medicamento que se considera necesario.   Si se requiere una autorizacin previa para que su compaa de seguros [place] su medicamento, por favor permtanos de 1 a 2 das hbiles para completar este proceso.  Los precios de los medicamentos varan con frecuencia dependiendo del Environmental consultant de dnde se surte la receta y alguna farmacias pueden ofrecer precios ms baratos.  El sitio web www.goodrx.com tiene cupones para medicamentos de Airline pilot. Los precios aqu no tienen en cuenta lo que podra costar con la ayuda del seguro (puede ser ms barato con su seguro), pero el sitio web puede darle el precio si no utiliz Research scientist (physical sciences).  - Puede imprimir el cupn correspondiente y llevarlo con su receta a la farmacia.  - Tambin puede pasar por nuestra oficina durante el horario de atencin regular y Charity fundraiser una tarjeta de cupones de GoodRx.  - Si necesita que su receta se enve electrnicamente a una farmacia diferente, informe a nuestra oficina a travs de MyChart de [place] o por telfono llamando al [phone] y presione la opcin 4.

## 2021-07-09 ENCOUNTER — Encounter: Payer: Self-pay | Admitting: Dermatology

## 2021-08-22 DIAGNOSIS — Z1231 Encounter for screening mammogram for malignant neoplasm of breast: Secondary | ICD-10-CM | POA: Diagnosis not present

## 2021-08-22 DIAGNOSIS — Z1151 Encounter for screening for human papillomavirus (HPV): Secondary | ICD-10-CM | POA: Diagnosis not present

## 2021-08-22 DIAGNOSIS — Z124 Encounter for screening for malignant neoplasm of cervix: Secondary | ICD-10-CM | POA: Diagnosis not present

## 2021-08-22 DIAGNOSIS — M8588 Other specified disorders of bone density and structure, other site: Secondary | ICD-10-CM | POA: Diagnosis not present

## 2021-08-22 DIAGNOSIS — N958 Other specified menopausal and perimenopausal disorders: Secondary | ICD-10-CM | POA: Diagnosis not present

## 2021-08-22 DIAGNOSIS — R29890 Loss of height: Secondary | ICD-10-CM | POA: Diagnosis not present

## 2021-08-22 DIAGNOSIS — Z1272 Encounter for screening for malignant neoplasm of vagina: Secondary | ICD-10-CM | POA: Diagnosis not present

## 2021-08-22 DIAGNOSIS — Z6825 Body mass index (BMI) 25.0-25.9, adult: Secondary | ICD-10-CM | POA: Diagnosis not present

## 2021-08-22 LAB — HM DEXA SCAN

## 2021-08-22 LAB — HM MAMMOGRAPHY

## 2021-08-24 LAB — HM PAP SMEAR: HPV, high-risk: NEGATIVE

## 2021-11-16 ENCOUNTER — Ambulatory Visit: Payer: PPO | Admitting: Dermatology

## 2021-11-16 DIAGNOSIS — D489 Neoplasm of uncertain behavior, unspecified: Secondary | ICD-10-CM

## 2021-11-16 DIAGNOSIS — L578 Other skin changes due to chronic exposure to nonionizing radiation: Secondary | ICD-10-CM

## 2021-11-16 DIAGNOSIS — L814 Other melanin hyperpigmentation: Secondary | ICD-10-CM | POA: Diagnosis not present

## 2021-11-16 DIAGNOSIS — C4492 Squamous cell carcinoma of skin, unspecified: Secondary | ICD-10-CM

## 2021-11-16 DIAGNOSIS — C44519 Basal cell carcinoma of skin of other part of trunk: Secondary | ICD-10-CM

## 2021-11-16 DIAGNOSIS — C4491 Basal cell carcinoma of skin, unspecified: Secondary | ICD-10-CM

## 2021-11-16 DIAGNOSIS — D0461 Carcinoma in situ of skin of right upper limb, including shoulder: Secondary | ICD-10-CM

## 2021-11-16 HISTORY — DX: Basal cell carcinoma of skin, unspecified: C44.91

## 2021-11-16 HISTORY — DX: Squamous cell carcinoma of skin, unspecified: C44.92

## 2021-11-16 NOTE — Patient Instructions (Signed)
Electrodesiccation and Curettage ("Scrape and Burn") Wound Care Instructions  Leave the original bandage on for 24 hours if possible.  If the bandage becomes soaked or soiled before that time, it is OK to remove it and examine the wound.  A small amount of post-operative bleeding is normal.  If excessive bleeding occurs, remove the bandage, place gauze over the site and apply continuous pressure (no peeking) over the area for 30 minutes. If this does not work, please call our clinic as soon as possible or page your doctor if it is after hours.   Once a day, cleanse the wound with soap and water. It is fine to shower. If a thick crust develops you may use a Q-tip dipped into dilute hydrogen peroxide (mix 1:1 with water) to dissolve it.  Hydrogen peroxide can slow the healing process, so use it only as needed.    After washing, apply petroleum jelly (Vaseline) or an antibiotic ointment if your doctor prescribed one for you, followed by a bandage.    For best healing, the wound should be covered with a layer of ointment at all times. If you are not able to keep the area covered with a bandage to hold the ointment in place, this may mean re-applying the ointment several times a day.  Continue this wound care until the wound has healed and is no longer open. It may take several weeks for the wound to heal and close.  Itching and mild discomfort is normal during the healing process.  If you have any discomfort, you can take Tylenol (acetaminophen) or ibuprofen as directed on the bottle. (Please do not take these if you have an allergy to them or cannot take them for another reason).  Some redness, tenderness and white or yellow material in the wound is normal healing.  If the area becomes very sore and red, or develops a thick yellow-green material (pus), it may be infected; please notify us.    Wound healing continues for up to one year following surgery. It is not unusual to experience pain in the scar  from time to time during the interval.  If the pain becomes severe or the scar thickens, you should notify the office.    A slight amount of redness in a scar is expected for the first six months.  After six months, the redness will fade and the scar will soften and fade.  The color difference becomes less noticeable with time.  If there are any problems, return for a post-op surgery check at your earliest convenience.  To improve the appearance of the scar, you can use silicone scar gel, cream, or sheets (such as Mederma or Serica) every night for up to one year. These are available over the counter (without a prescription).  Please call our office at [phone] for any questions or concerns.  Biopsy Wound Care Instructions  Leave the original bandage on for 24 hours if possible.  If the bandage becomes soaked or soiled before that time, it is OK to remove it and examine the wound.  A small amount of post-operative bleeding is normal.  If excessive bleeding occurs, remove the bandage, place gauze over the site and apply continuous pressure (no peeking) over the area for 30 minutes. If this does not work, please call our clinic as soon as possible or page your doctor if it is after hours.   Once a day, cleanse the wound with soap and water. It is fine to shower. If   a thick crust develops you may use a Q-tip dipped into dilute hydrogen peroxide (mix 1:1 with water) to dissolve it.  Hydrogen peroxide can slow the healing process, so use it only as needed.    After washing, apply petroleum jelly (Vaseline) or an antibiotic ointment if your doctor prescribed one for you, followed by a bandage.    For best healing, the wound should be covered with a layer of ointment at all times. If you are not able to keep the area covered with a bandage to hold the ointment in place, this may mean re-applying the ointment several times a day.  Continue this wound care until the wound has healed and is no longer  open.   Itching and mild discomfort is normal during the healing process. However, if you develop pain or severe itching, please call our office.   If you have any discomfort, you can take Tylenol (acetaminophen) or ibuprofen as directed on the bottle. (Please do not take these if you have an allergy to them or cannot take them for another reason).  Some redness, tenderness and white or yellow material in the wound is normal healing.  If the area becomes very sore and red, or develops a thick yellow-green material (pus), it may be infected; please notify us.    If you have stitches, return to clinic as directed to have the stitches removed. You will continue wound care for 2-3 days after the stitches are removed.   Wound healing continues for up to one year following surgery. It is not unusual to experience pain in the scar from time to time during the interval.  If the pain becomes severe or the scar thickens, you should notify the office.    A slight amount of redness in a scar is expected for the first six months.  After six months, the redness will fade and the scar will soften and fade.  The color difference becomes less noticeable with time.  If there are any problems, return for a post-op surgery check at your earliest convenience.  To improve the appearance of the scar, you can use silicone scar gel, cream, or sheets (such as Mederma or Serica) every night for up to one year. These are available over the counter (without a prescription).  Please call our office at [phone] for any questions or concerns.       Due to recent changes in healthcare laws, you may see results of your pathology and/or laboratory studies on MyChart before the doctors have had a chance to review them. We understand that in some cases there may be results that are confusing or concerning to you. Please understand that not all results are received at the same time and often the doctors may need to interpret  multiple results in order to provide you with the best plan of care or course of treatment. Therefore, we ask that you please give us 2 business days to thoroughly review all your results before contacting the office for clarification. Should we see a critical lab result, you will be contacted sooner.   If You Need Anything After Your Visit  If you have any questions or concerns for your doctor, please call our main line at [phone] and press option 4 to reach your doctor's medical assistant. If no one answers, please leave a voicemail as directed and we will return your call as soon as possible. Messages left after 4 pm will be answered the following business day.     You may also send us a message via MyChart. We typically respond to MyChart messages within 1-2 business days.  For prescription refills, please ask your pharmacy to contact our office. Our fax number is [phone].  If you have an urgent issue when the clinic is closed that cannot wait until the next business day, you can page your doctor at the number below.    Please note that while we do our best to be available for urgent issues outside of office hours, we are not available 24/7.   If you have an urgent issue and are unable to reach us, you may choose to seek medical care at your doctor's office, retail clinic, urgent care center, or emergency room.  If you have a medical emergency, please immediately call 911 or go to the emergency department.  Pager Numbers  - Dr. Kowalski: [phone]  - Dr. Moye: [phone]  - Dr. Stewart: [phone]  In the event of inclement weather, please call our main line at [phone] for an update on the status of any delays or closures.  Dermatology Medication Tips: Please keep the boxes that topical medications come in in order to help keep track of the instructions about where and how to use these. Pharmacies typically print the medication instructions only on the boxes and  not directly on the medication tubes.   If your medication is too expensive, please contact our office at [phone] option 4 or send us a message through MyChart.   We are unable to tell what your co-pay for medications will be in advance as this is different depending on your insurance coverage. However, we may be able to find a substitute medication at lower cost or fill out paperwork to get insurance to cover a needed medication.   If a prior authorization is required to get your medication covered by your insurance company, please allow us 1-2 business days to complete this process.  Drug prices often vary depending on where the prescription is filled and some pharmacies may offer cheaper prices.  The website www.goodrx.com contains coupons for medications through different pharmacies. The prices here do not account for what the cost may be with help from insurance (it may be cheaper with your insurance), but the website can give you the price if you did not use any insurance.  - You can print the associated coupon and take it with your prescription to the pharmacy.  - You may also stop by our office during regular business hours and pick up a GoodRx coupon card.  - If you need your prescription sent electronically to a different pharmacy, notify our office through [facility] MyChart or by phone at [phone] option 4.     Si Usted Necesita Algo Despus de Su Visita  Tambin puede enviarnos un mensaje a travs de MyChart. Por lo general respondemos a los mensajes de MyChart en el transcurso de 1 a 2 das hbiles.  Para renovar recetas, por favor pida a su farmacia que se ponga en contacto con nuestra oficina. Nuestro nmero de fax es el [phone].  Si tiene un asunto urgente cuando la clnica est cerrada y que no puede esperar hasta el siguiente da hbil, puede llamar/localizar a su doctor(a) al nmero que aparece a continuacin.   Por favor, tenga en cuenta que aunque  hacemos todo lo posible para estar disponibles para asuntos urgentes fuera del horario de oficina, no estamos disponibles las 24 horas del da, los 7 das de la semana.     Si tiene un problema urgente y no puede comunicarse con nosotros, puede optar por buscar atencin mdica  en el consultorio de su doctor(a), en una clnica privada, en un centro de atencin urgente o en una sala de emergencias.  Si tiene una emergencia mdica, por favor llame inmediatamente al 911 o vaya a la sala de emergencias.  Nmeros de bper  - Dr. Kowalski: [phone]  - Dra. Moye: [phone]  - Dra. Stewart: [phone]  En caso de inclemencias del tiempo, por favor llame a nuestra lnea principal al [phone] para una actualizacin sobre el estado de cualquier retraso o cierre.  Consejos para la medicacin en dermatologa: Por favor, guarde las cajas en las que vienen los medicamentos de uso tpico para ayudarle a seguir las instrucciones sobre dnde y cmo usarlos. Las farmacias generalmente imprimen las instrucciones del medicamento slo en las cajas y no directamente en los tubos del medicamento.   Si su medicamento es muy caro, por favor, pngase en contacto con nuestra oficina llamando al [phone] y presione la opcin 4 o envenos un mensaje a travs de MyChart.   No podemos decirle cul ser su copago por los medicamentos por adelantado ya que esto es diferente dependiendo de la cobertura de su seguro. Sin embargo, es posible que podamos encontrar un medicamento sustituto a menor costo o llenar un formulario para que el seguro cubra el medicamento que se considera necesario.   Si se requiere una autorizacin previa para que su compaa de seguros cubra su medicamento, por favor permtanos de 1 a 2 das hbiles para completar este proceso.  Los precios de los medicamentos varan con frecuencia dependiendo del lugar de dnde se surte la receta y alguna farmacias pueden ofrecer precios ms  baratos.  El sitio web www.goodrx.com tiene cupones para medicamentos de diferentes farmacias. Los precios aqu no tienen en cuenta lo que podra costar con la ayuda del seguro (puede ser ms barato con su seguro), pero el sitio web puede darle el precio si no utiliz ningn seguro.  - Puede imprimir el cupn correspondiente y llevarlo con su receta a la farmacia.  - Tambin puede pasar por nuestra oficina durante el horario de atencin regular y recoger una tarjeta de cupones de GoodRx.  - Si necesita que su receta se enve electrnicamente a una farmacia diferente, informe a nuestra oficina a travs de MyChart de [facility] o por telfono llamando al [phone] y presione la opcin 4.  

## 2021-11-16 NOTE — Progress Notes (Signed)
Follow-Up Visit   Subjective  Terri Espinoza is a 66 y.o. female who presents for the following: Other (Hx of melanoma, patient here today concerning a red bump at left abdomen and painful bump at right upper arm. Reports spots have not been there long. ). The patient has spots, moles and lesions to be evaluated, some may be new or changing and the patient has concerns that these could be cancer.  The following portions of the chart were reviewed this encounter and updated as appropriate:  Tobacco  Allergies  Meds  Problems  Med Hx  Surg Hx  Fam Hx     Review of Systems: No other skin or systemic complaints except as noted in HPI or Assessment and Plan.  Objective  Well appearing patient in no apparent distress; mood and affect are within normal limits.  A focused examination was performed including left abdomen, right tricep . Relevant physical exam findings are noted in the Assessment and Plan.  left abdomen at costal area 1.1 cm pink papule        right tricep 1.1 cm pink patch         Assessment & Plan  Neoplasm of uncertain behavior (2) left abdomen at costal area Epidermal / dermal shaving  Lesion diameter (cm):  1.1 Informed consent: discussed and consent obtained   Timeout: patient name, date of birth, surgical site, and procedure verified   Patient was prepped and draped in usual sterile fashion: area prepped with isopropyl alcohol. Anesthesia: the lesion was anesthetized in a standard fashion   Anesthetic:  1% lidocaine w/ epinephrine 1-100,000 buffered w/ 8.4% NaHCO3 Instrument used: flexible razor blade   Hemostasis achieved with: aluminum chloride   Outcome: patient tolerated procedure well   Post-procedure details: wound care instructions given   Additional details:  Mupirocin and a bandage applied  Destruction of lesion Complexity: extensive   Destruction method: electrodesiccation and curettage   Informed consent: discussed and consent  obtained   Timeout:  patient name, date of birth, surgical site, and procedure verified Procedure prep:  Patient was prepped and draped in usual sterile fashion Prep type:  Isopropyl alcohol Anesthesia: the lesion was anesthetized in a standard fashion   Anesthetic:  1% lidocaine w/ epinephrine 1-100,000 buffered w/ 8.4% NaHCO3 Curettage performed in three different directions: Yes   Electrodesiccation performed over the curetted area: Yes   Lesion length (cm):  1.1 Lesion width (cm):  1.1 Margin per side (cm):  0.2 Final wound size (cm):  1.5 Hemostasis achieved with:  pressure, aluminum chloride and electrodesiccation Outcome: patient tolerated procedure well with no complications   Post-procedure details: sterile dressing applied and wound care instructions given   Dressing type: bandage and petrolatum    Specimen 1 - Surgical pathology Differential Diagnosis: r/o bcc  Check Margins: No  right tricep Epidermal / dermal shaving  Lesion diameter (cm):  1.1 Informed consent: discussed and consent obtained   Timeout: patient name, date of birth, surgical site, and procedure verified   Procedure prep:  Patient was prepped and draped in usual sterile fashion Prep type:  Isopropyl alcohol Anesthesia: the lesion was anesthetized in a standard fashion   Anesthetic:  1% lidocaine w/ epinephrine 1-100,000 buffered w/ 8.4% NaHCO3 Instrument used: flexible razor blade   Hemostasis achieved with: pressure, aluminum chloride and electrodesiccation   Outcome: patient tolerated procedure well   Post-procedure details: sterile dressing applied and wound care instructions given   Dressing type: bandage and petrolatum  Specimen 2 - Surgical pathology Differential Diagnosis: R/o bcc vs other  Check Margins: No R/o bcc R/o bcc vs other   Actinic Damage - chronic, secondary to cumulative UV radiation exposure/sun exposure over time - diffuse scaly erythematous macules with underlying  dyspigmentation - Recommend daily broad spectrum sunscreen SPF 30+ to sun-exposed areas, reapply every 2 hours as needed.  - Recommend staying in the shade or wearing long sleeves, sun glasses (UVA+UVB protection) and wide brim hats (4-inch brim around the entire circumference of the hat). - Call for new or changing lesions.  Lentigines - Scattered tan macules - Due to sun exposure - Benign-appearing, observe - Recommend daily broad spectrum sunscreen SPF 30+ to sun-exposed areas, reapply every 2 hours as needed. - Call for any changes  Return for keep follow up as scheduled 5/22. IRuthell Rummage, CMA, am acting as scribe for Sarina Ser, MD. Documentation: I have reviewed the above documentation for accuracy and completeness, and I agree with the above.  Sarina Ser, MD

## 2021-11-21 ENCOUNTER — Telehealth: Payer: Self-pay

## 2021-11-21 ENCOUNTER — Encounter: Payer: Self-pay | Admitting: Dermatology

## 2021-11-21 NOTE — Telephone Encounter (Signed)
-----   Message from Ralene Bathe, MD sent at 11/21/2021 12:40 PM EDT ----- Diagnosis 1. Skin , left abdomen at costal area BASAL CELL CARCINOMA, NODULAR PATTERN 2. Skin , right tricep SQUAMOUS CELL CARCINOMA IN SITU  1- Cancer - BCC Already treated Recheck next visit 2- Cancer - SCCis Schedule for treatment (plan EDC)

## 2021-11-21 NOTE — Telephone Encounter (Signed)
Patient informed of pathology results and appointment scheduled.  °

## 2022-01-25 DIAGNOSIS — I10 Essential (primary) hypertension: Secondary | ICD-10-CM | POA: Diagnosis not present

## 2022-01-25 DIAGNOSIS — N1831 Chronic kidney disease, stage 3a: Secondary | ICD-10-CM | POA: Diagnosis not present

## 2022-01-25 DIAGNOSIS — E785 Hyperlipidemia, unspecified: Secondary | ICD-10-CM | POA: Diagnosis not present

## 2022-01-25 DIAGNOSIS — D649 Anemia, unspecified: Secondary | ICD-10-CM | POA: Diagnosis not present

## 2022-01-25 DIAGNOSIS — R809 Proteinuria, unspecified: Secondary | ICD-10-CM | POA: Diagnosis not present

## 2022-01-30 ENCOUNTER — Ambulatory Visit: Payer: PPO | Admitting: Dermatology

## 2022-01-30 ENCOUNTER — Encounter: Payer: Self-pay | Admitting: Dermatology

## 2022-01-30 DIAGNOSIS — D0461 Carcinoma in situ of skin of right upper limb, including shoulder: Secondary | ICD-10-CM

## 2022-01-30 DIAGNOSIS — D099 Carcinoma in situ, unspecified: Secondary | ICD-10-CM

## 2022-01-30 NOTE — Progress Notes (Signed)
   Follow-Up Visit   Subjective  Terri Espinoza is a 66 y.o. female who presents for the following: SCC IS bx proven.  The following portions of the chart were reviewed this encounter and updated as appropriate:   Tobacco  Allergies  Meds  Problems  Med Hx  Surg Hx  Fam Hx     Review of Systems:  No other skin or systemic complaints except as noted in HPI or Assessment and Plan.  Objective  Well appearing patient in no apparent distress; mood and affect are within normal limits.  A focused examination was performed including right arm. Relevant physical exam findings are noted in the Assessment and Plan.  R tricep Pink bx site 1.2cm   Assessment & Plan  Squamous cell carcinoma in situ (SCCIS) R tricep  Destruction of lesion Complexity: extensive   Destruction method: electrodesiccation and curettage   Informed consent: discussed and consent obtained   Timeout:  patient name, date of birth, surgical site, and procedure verified Procedure prep:  Patient was prepped and draped in usual sterile fashion Prep type:  Isopropyl alcohol Anesthesia: the lesion was anesthetized in a standard fashion   Anesthetic:  1% lidocaine w/ epinephrine 1-100,000 buffered w/ 8.4% NaHCO3 Curettage performed in three different directions: Yes   Electrodesiccation performed over the curetted area: Yes   Lesion length (cm):  1.2 Lesion width (cm):  1.2 Margin per side (cm):  0.2 Final wound size (cm):  1.6 Hemostasis achieved with:  pressure, aluminum chloride and electrodesiccation Outcome: patient tolerated procedure well with no complications   Post-procedure details: sterile dressing applied and wound care instructions given   Dressing type: bandage and petrolatum    SCC IS bx proven   Return for as scheduled for TBSE , Hx of Melanoma IS, Hx of BCC, Hx of SCC IS, Hx of Dysplastic nevi.  I, Othelia Pulling, RMA, am acting as scribe for Sarina Ser, MD . Documentation: I have  reviewed the above documentation for accuracy and completeness, and I agree with the above.  Sarina Ser, MD

## 2022-01-30 NOTE — Patient Instructions (Signed)
Wound Care Instructions  Cleanse wound gently with soap and water once a day then pat dry with clean gauze. Apply a thin coat of Petrolatum (petroleum jelly, "Vaseline") over the wound (unless you have an allergy to this). We recommend that you use a new, sterile tube of Vaseline. Do not pick or remove scabs. Do not remove the yellow or white "healing tissue" from the base of the wound.  Cover the wound with fresh, clean, nonstick gauze and secure with paper tape. You may use Band-Aids in place of gauze and tape if the wound is small enough, but would recommend trimming much of the tape off as there is often too much. Sometimes Band-Aids can irritate the skin.  You should call the office for your biopsy report after 1 week if you have not already been contacted.  If you experience any problems, such as abnormal amounts of bleeding, swelling, significant bruising, significant pain, or evidence of infection, please call the office immediately.  FOR ADULT SURGERY PATIENTS: If you need something for pain relief you may take 1 extra strength Tylenol (acetaminophen) AND 2 Ibuprofen ('200mg'$  each) together every 4 hours as needed for pain. (do not take these if you are allergic to them or if you have a reason you should not take them.) Typically, you may only need pain medication for 1 to 3 days.

## 2022-02-09 ENCOUNTER — Encounter: Payer: Self-pay | Admitting: Dermatology

## 2022-02-19 DIAGNOSIS — H52223 Regular astigmatism, bilateral: Secondary | ICD-10-CM | POA: Diagnosis not present

## 2022-02-19 DIAGNOSIS — H04123 Dry eye syndrome of bilateral lacrimal glands: Secondary | ICD-10-CM | POA: Diagnosis not present

## 2022-02-19 DIAGNOSIS — H524 Presbyopia: Secondary | ICD-10-CM | POA: Diagnosis not present

## 2022-02-19 DIAGNOSIS — H5203 Hypermetropia, bilateral: Secondary | ICD-10-CM | POA: Diagnosis not present

## 2022-04-05 ENCOUNTER — Telehealth: Payer: Self-pay | Admitting: Family Medicine

## 2022-04-05 NOTE — Telephone Encounter (Signed)
Called patient to schedule Medicare Annual Wellness Visit (AWV). Left message for patient to call back and schedule Medicare Annual Wellness Visit (AWV).  Last date of AWV: 05/06/2022  Please schedule an appointment at any time with [facility].  If any questions, please contact me at [phone].  Thank you ,   [facility] Direct Dial: [phone]

## 2022-04-09 ENCOUNTER — Telehealth: Payer: Self-pay | Admitting: Family Medicine

## 2022-04-09 NOTE — Telephone Encounter (Signed)
Contacted Terri Espinoza to schedule their annual wellness visit. Appointment made for 05/09/2022.  [facility] Direct Dial: [phone]

## 2022-04-10 ENCOUNTER — Telehealth (INDEPENDENT_AMBULATORY_CARE_PROVIDER_SITE_OTHER): Payer: HMO | Admitting: Family Medicine

## 2022-04-10 ENCOUNTER — Encounter: Payer: Self-pay | Admitting: Family Medicine

## 2022-04-10 VITALS — Temp 99.6°F

## 2022-04-10 DIAGNOSIS — J069 Acute upper respiratory infection, unspecified: Secondary | ICD-10-CM | POA: Diagnosis not present

## 2022-04-10 NOTE — Progress Notes (Signed)
Virtual Visit via Video Note  I connected with ARRAYAH SOPP on 04/10/22 at 12:00 PM EST by a video enabled telemedicine application and verified that I am speaking with the correct person using two identifiers.  Location: Patient: home Provider: office    I discussed the limitations of evaluation and management by telemedicine and the availability of in person appointments. The patient expressed understanding and agreed to proceed.  Parties involved in encounter  Patient: Terri Espinoza   Provider:  Loura Pardon MD   History of Present Illness: Pt presents for uri symptoms with fever  Symptoms started last wed   A little better -less runny nose  Is congested  Very tired  Nasal mucous is clear  Ears are itchy/no pain  Throat - is fine /not sore    Not much cough- not as bad as it was  Non productive  No wheeze No sob   No n/v/d    Fever 99.6  Comes on at night / chills and body aches   Otc: Tylenol  Mucinex  (sinus pressure max) Nasal spray- flonase  Used a sinus irrigation on Sunday   ? If pollen allergies also  Taking zyrtec (was on claritin and xyzal)      Did covid test at home which was negative   Mother had same symptoms    Lab Results  Component Value Date   CREATININE 0.91 05/05/2021   BUN 15 05/05/2021   NA 138 05/05/2021   K 4.3 05/05/2021   CL 103 05/05/2021   CO2 29 05/05/2021    Patient Active Problem List   Diagnosis Date Noted   URI with cough and congestion 04/10/2022   Welcome to Medicare preventive visit 05/07/2021   Acute sinusitis 05/01/2021   CKD (chronic kidney disease) stage 2, GFR 60-89 ml/min 09/28/2019   Prolapsed internal hemorrhoids, grade 2 09/07/2013   Generalized anxiety disorder 03/05/2013   Hypothyroidism 04/24/2012   HLD (hyperlipidemia) 04/24/2012   INCONTINENCE, URGE 12/19/2009   Allergic rhinitis 08/08/2009   DEPRESSION, MILD 01/29/2007   Essential hypertension 01/29/2007   Past Medical History:   Diagnosis Date   Allergic rhinitis    Allergy    Anal fissure    Anxiety    Arthritis    Basal cell carcinoma 06/22/2020   Right prox lat calf near knee - [facility] 07/05/20   Basal cell carcinoma (BCC) 11/16/2021   left abdomen at costal area - ED&C   Cancer ([place])    melanoma on back and upper abdomen  BCC also multiple   Dysplastic nevus 09/15/2019   R mid medial scapula, moderate to severe, shave removal   Hemorrhoids    HLD (hyperlipidemia)    HTN (hypertension)    Hx of basal cell carcinoma 01/11/2006   chest   Hx of basal cell carcinoma 12/08/2009   L anticubital   Hx of basal cell carcinoma 12/29/2013   L proximal antecubital   Hx of basal cell carcinoma 12/23/2014   R low back   Hx of dysplastic nevus 01/24/2007   R upper back, moderate atypia   Hx of dysplastic nevus 06/22/2014   R post medial mid thigh   Hx of dysplastic nevus 12/06/2014   R post waistline, mild atypia   Hx of dysplastic nevus 12/06/2014   R sup lat buttocks, mild atypia   Hypothyroidism    pt. denies   Melanoma ([place]) 12/29/2009   R mid back, Melanoma IS   Osteopenia    Squamous  cell carcinoma of skin 11/16/2021   SCCIS R tricep - ED&C 01/30/22   Uterine fibroid    Past Surgical History:  Procedure Laterality Date   ABDOMINAL HYSTERECTOMY  1995   partial, fibroids, Dr. Nori Riis   COLONOSCOPY  2009   FLEXIBLE SIGMOIDOSCOPY  2002   HEMORRHOID BANDING     skin cancer removal     melanoma upper abdomen/back   BCC multiple    URETHRAL DILATION  1988   Dr. Leory Plowman   Social History   Tobacco Use   Smoking status: Former   Smokeless tobacco: Never   Tobacco comments:    quit 25 years ago  Vaping Use   Vaping Use: Never used  Substance Use Topics   Alcohol use: No   Drug use: No   Family History  Problem Relation Age of Onset   Hypertension Mother    Hyperlipidemia Mother    Alcohol abuse Father    Cirrhosis Father    Colon polyps Father    Colon polyps Sister    Hypertension Sister     Hypertension Maternal Aunt    Lung cancer Paternal Aunt    Colon cancer Neg Hx    Esophageal cancer Neg Hx    Stomach cancer Neg Hx    Rectal cancer Neg Hx    No Known Allergies Current Outpatient Medications on File Prior to Visit  Medication Sig Dispense Refill   Calcium-Magnesium-Vitamin D (CALCIUM 500 PO) Take by mouth.     cetirizine (ZYRTEC) 10 MG chewable tablet Chew 10 mg by mouth daily.     cholecalciferol (VITAMIN D) 1000 UNITS tablet Take 5,000 Units by mouth daily.     estradiol (ESTRACE) 1 MG tablet Take 1 mg by mouth daily.     fluticasone (FLONASE) 50 MCG/ACT nasal spray Place 2 sprays into both nostrils daily. 16 g 6   lisinopril (ZESTRIL) 2.5 MG tablet Take 2.5 mg by mouth daily.     loratadine (CLARITIN) 5 MG chewable tablet Chew 5 mg by mouth daily.     Multiple Vitamin (MULTIVITAMIN) tablet Take 1 tablet by mouth daily.     Multiple Vitamins-Minerals (EMERGEN-C BLUE PO) Take by mouth.     Multiple Vitamins-Minerals (ZINC PO) Take by mouth.     No current facility-administered medications on file prior to visit.   Review of Systems  Constitutional:  Positive for fever and malaise/fatigue. Negative for chills.  HENT:  Positive for congestion. Negative for ear pain, sinus pain and sore throat.   Eyes:  Negative for blurred vision, discharge and redness.  Respiratory:  Positive for cough. Negative for hemoptysis, sputum production, shortness of breath, wheezing and stridor.   Cardiovascular:  Negative for chest pain, palpitations and leg swelling.  Gastrointestinal:  Negative for abdominal pain, diarrhea, nausea and vomiting.  Musculoskeletal:  Negative for myalgias.  Skin:  Negative for rash.  Neurological:  Positive for headaches. Negative for dizziness.      Observations/Objective:  Patient appears well, in no distress Weight is baseline  No facial swelling or asymmetry Normal voice-not hoarse and no slurred speech No obvious tremor or mobility  impairment Moving neck and UEs normally Able to hear the call well  No wheeze or shortness of breath during interview  Pt clears throat and sniffles  Some dry sounding cough  Talkative and mentally sharp with no cognitive changes No skin changes on face or neck , no rash or pallor Affect is normal   Assessment and Plan: Problem List  Items Addressed This Visit       Respiratory   URI with cough and congestion - Primary    Day 6 Had neg covid test at home  Disc sympt care-see AVS Disc ER precautions (sob, high fever)   This may have started with influenza but too late for anti viral  Suspect viral cause  Update if not starting to improve in a week or if worsening   Handout given         Follow Up Instructions: Drink fluids and rest  mucinex DM is good for cough and congestion  Nasal saline for congestion as needed  Continue your generic flonase daily and continue your zyrtec for runny nose  Tylenol for fever or pain or headache  Please alert [place] if symptoms worsen (if severe or short of breath please go to the ER)   Please alert [place] if worse cough/ or any shortness of breath or wheezing Also if facial pain /pressure with thick green nasal mucous   Update if not starting to improve in a week or if worsening     I discussed the assessment and treatment plan with the patient. The patient was provided an opportunity to ask questions and all were answered. The patient agreed with the plan and demonstrated an understanding of the instructions.   The patient was advised to call back or seek an in-person evaluation if the symptoms worsen or if the condition fails to improve as anticipated.     Loura Pardon, MD

## 2022-04-10 NOTE — Patient Instructions (Signed)
Drink fluids and rest  mucinex DM is good for cough and congestion  Nasal saline for congestion as needed  Continue your generic flonase daily and continue your zyrtec for runny nose  Tylenol for fever or pain or headache  Please alert [place] if symptoms worsen (if severe or short of breath please go to the ER)   Please alert [place] if worse cough/ or any shortness of breath or wheezing Also if facial pain /pressure with thick green nasal mucous   Update if not starting to improve in a week or if worsening

## 2022-04-10 NOTE — Assessment & Plan Note (Signed)
Day 6 Had neg covid test at home  Disc sympt care-see AVS Disc ER precautions (sob, high fever)   This may have started with influenza but too late for anti viral  Suspect viral cause  Update if not starting to improve in a week or if worsening   Handout given

## 2022-05-01 ENCOUNTER — Telehealth: Payer: Self-pay | Admitting: Family Medicine

## 2022-05-01 DIAGNOSIS — N182 Chronic kidney disease, stage 2 (mild): Secondary | ICD-10-CM

## 2022-05-01 DIAGNOSIS — E7841 Elevated Lipoprotein(a): Secondary | ICD-10-CM

## 2022-05-01 DIAGNOSIS — E039 Hypothyroidism, unspecified: Secondary | ICD-10-CM

## 2022-05-01 DIAGNOSIS — I10 Essential (primary) hypertension: Secondary | ICD-10-CM

## 2022-05-01 NOTE — Telephone Encounter (Signed)
-----   Message from Velna Hatchet, RT sent at 04/16/2022  2:09 PM EST ----- Regarding: Wed 3/20 lab Patient is scheduled for cpx, please order future labs.  Thanks, Anda Kraft

## 2022-05-02 ENCOUNTER — Other Ambulatory Visit (INDEPENDENT_AMBULATORY_CARE_PROVIDER_SITE_OTHER): Payer: HMO

## 2022-05-02 ENCOUNTER — Telehealth: Payer: Self-pay | Admitting: Family Medicine

## 2022-05-02 DIAGNOSIS — E7841 Elevated Lipoprotein(a): Secondary | ICD-10-CM

## 2022-05-02 DIAGNOSIS — I10 Essential (primary) hypertension: Secondary | ICD-10-CM | POA: Diagnosis not present

## 2022-05-02 DIAGNOSIS — N182 Chronic kidney disease, stage 2 (mild): Secondary | ICD-10-CM | POA: Diagnosis not present

## 2022-05-02 DIAGNOSIS — E039 Hypothyroidism, unspecified: Secondary | ICD-10-CM

## 2022-05-02 LAB — COMPREHENSIVE METABOLIC PANEL
ALT: 11 U/L (ref 0–35)
AST: 16 U/L (ref 0–37)
Albumin: 4 g/dL (ref 3.5–5.2)
Alkaline Phosphatase: 56 U/L (ref 39–117)
BUN: 19 mg/dL (ref 6–23)
CO2: 29 mEq/L (ref 19–32)
Calcium: 9.1 mg/dL (ref 8.4–10.5)
Chloride: 101 mEq/L (ref 96–112)
Creatinine, Ser: 1.06 mg/dL (ref 0.40–1.20)
GFR: 54.65 mL/min — ABNORMAL LOW (ref >60.00)
Glucose, Bld: 84 mg/dL (ref 70–99)
Potassium: 4.2 mEq/L (ref 3.5–5.1)
Sodium: 137 mEq/L (ref 135–145)
Total Bilirubin: 0.5 mg/dL (ref 0.2–1.2)
Total Protein: 6.6 g/dL (ref 6.0–8.3)

## 2022-05-02 LAB — CBC WITH DIFFERENTIAL/PLATELET
Basophils Absolute: 0 10*3/uL (ref 0.0–0.1)
Basophils Relative: 0.7 % (ref 0.0–3.0)
Eosinophils Absolute: 0.1 10*3/uL (ref 0.0–0.7)
Eosinophils Relative: 1.2 % (ref 0.0–5.0)
HCT: 36.4 % (ref 36.0–46.0)
Hemoglobin: 12.3 g/dL (ref 12.0–15.0)
Lymphocytes Relative: 18.6 % (ref 12.0–46.0)
Lymphs Abs: 1.1 10*3/uL (ref 0.7–4.0)
MCHC: 33.7 g/dL (ref 30.0–36.0)
MCV: 95 fl (ref 78.0–100.0)
Monocytes Absolute: 0.6 10*3/uL (ref 0.1–1.0)
Monocytes Relative: 10.4 % (ref 3.0–12.0)
Neutro Abs: 4.1 10*3/uL (ref 1.4–7.7)
Neutrophils Relative %: 69.1 % (ref 43.0–77.0)
Platelets: 197 10*3/uL (ref 150.0–400.0)
RBC: 3.83 Mil/uL — ABNORMAL LOW (ref 3.87–5.11)
RDW: 13.2 % (ref 11.5–15.5)
WBC: 5.9 10*3/uL (ref 4.0–10.5)

## 2022-05-02 LAB — LIPID PANEL
Cholesterol: 250 mg/dL — ABNORMAL HIGH (ref 0–200)
HDL: 69.8 mg/dL (ref >39.00)
LDL Cholesterol: 158 mg/dL — ABNORMAL HIGH (ref 0–99)
NonHDL: 180.31
Total CHOL/HDL Ratio: 4
Triglycerides: 111 mg/dL (ref 0.0–149.0)
VLDL: 22.2 mg/dL (ref 0.0–40.0)

## 2022-05-02 LAB — VITAMIN D 25 HYDROXY (VIT D DEFICIENCY, FRACTURES): VITD: 43.29 ng/mL (ref 30.00–100.00)

## 2022-05-02 LAB — TSH: TSH: 11.76 u[IU]/mL — ABNORMAL HIGH (ref 0.35–5.50)

## 2022-05-02 NOTE — Telephone Encounter (Signed)
Patient came in and left a copy of her Living Will. Placed in Dr. Alba Cory box up front. Thank you!

## 2022-05-02 NOTE — Telephone Encounter (Signed)
In your inbox.

## 2022-05-09 ENCOUNTER — Ambulatory Visit (INDEPENDENT_AMBULATORY_CARE_PROVIDER_SITE_OTHER): Payer: HMO | Admitting: Family Medicine

## 2022-05-09 ENCOUNTER — Encounter: Payer: Self-pay | Admitting: Family Medicine

## 2022-05-09 ENCOUNTER — Ambulatory Visit (INDEPENDENT_AMBULATORY_CARE_PROVIDER_SITE_OTHER): Payer: HMO

## 2022-05-09 VITALS — Ht <= 58 in | Wt 120.0 lb

## 2022-05-09 VITALS — BP 114/70 | HR 85 | Temp 97.7°F | Ht <= 58 in | Wt 122.4 lb

## 2022-05-09 DIAGNOSIS — M8589 Other specified disorders of bone density and structure, multiple sites: Secondary | ICD-10-CM | POA: Diagnosis not present

## 2022-05-09 DIAGNOSIS — E038 Other specified hypothyroidism: Secondary | ICD-10-CM

## 2022-05-09 DIAGNOSIS — D692 Other nonthrombocytopenic purpura: Secondary | ICD-10-CM

## 2022-05-09 DIAGNOSIS — I10 Essential (primary) hypertension: Secondary | ICD-10-CM

## 2022-05-09 DIAGNOSIS — E7841 Elevated Lipoprotein(a): Secondary | ICD-10-CM | POA: Diagnosis not present

## 2022-05-09 DIAGNOSIS — Z0000 Encounter for general adult medical examination without abnormal findings: Secondary | ICD-10-CM

## 2022-05-09 DIAGNOSIS — N182 Chronic kidney disease, stage 2 (mild): Secondary | ICD-10-CM

## 2022-05-09 DIAGNOSIS — M8580 Other specified disorders of bone density and structure, unspecified site: Secondary | ICD-10-CM | POA: Insufficient documentation

## 2022-05-09 NOTE — Progress Notes (Signed)
I connected with  QUINESHIA RUHL on 05/09/22 by a audio enabled telemedicine application and verified that I am speaking with the correct person using two identifiers.  Patient Location: Home  Provider Location: Office/Clinic  I discussed the limitations of evaluation and management by telemedicine. The patient expressed understanding and agreed to proceed.  Subjective:   LATERICA YUNG is a 67 y.o. female who presents for Medicare Annual (Subsequent) preventive examination.  Review of Systems      Cardiac Risk Factors include: advanced age (>31men, >66 women);hypertension     Objective:    Today's Vitals   05/09/22 0854  Weight: 120 lb (54.4 kg)  Height: 4\' 10"  (1.473 m)   Body mass index is 25.08 kg/m.     05/09/2022    9:02 AM  Advanced Directives  Does Patient Have a Medical Advance Directive? Yes  Type of Paramedic of [place];Living will  Copy of [place] in Chart? No - copy requested    Current Medications (verified) Outpatient Encounter Medications as of 05/09/2022  Medication Sig   Calcium-Magnesium-Vitamin D (CALCIUM 500 PO) Take by mouth.   cetirizine (ZYRTEC) 10 MG chewable tablet Chew 10 mg by mouth daily.   cholecalciferol (VITAMIN D) 1000 UNITS tablet Take 5,000 Units by mouth daily.   estradiol (ESTRACE) 1 MG tablet Take 1 mg by mouth daily.   fluticasone (FLONASE) 50 MCG/ACT nasal spray Place 2 sprays into both nostrils daily.   lisinopril (ZESTRIL) 2.5 MG tablet Take 2.5 mg by mouth daily.   loratadine (CLARITIN) 5 MG chewable tablet Chew 5 mg by mouth daily.   Magnesium 100 MG CAPS Take by mouth.   Multiple Vitamin (MULTIVITAMIN) tablet Take 1 tablet by mouth daily.   Multiple Vitamins-Minerals (EMERGEN-C BLUE PO) Take by mouth.   Multiple Vitamins-Minerals (ZINC PO) Take by mouth.   No facility-administered encounter medications on file as of 05/09/2022.    Allergies (verified) Patient has no  known allergies.   History: Past Medical History:  Diagnosis Date   Allergic rhinitis    Allergy    Anal fissure    Anxiety    Arthritis    Basal cell carcinoma 06/22/2020   Right prox lat calf near knee - [facility] 07/05/20   Basal cell carcinoma (BCC) 11/16/2021   left abdomen at costal area - ED&C   Cancer ([place])    melanoma on back and upper abdomen  BCC also multiple   Dysplastic nevus 09/15/2019   R mid medial scapula, moderate to severe, shave removal   Hemorrhoids    HLD (hyperlipidemia)    HTN (hypertension)    Hx of basal cell carcinoma 01/11/2006   chest   Hx of basal cell carcinoma 12/08/2009   L anticubital   Hx of basal cell carcinoma 12/29/2013   L proximal antecubital   Hx of basal cell carcinoma 12/23/2014   R low back   Hx of dysplastic nevus 01/24/2007   R upper back, moderate atypia   Hx of dysplastic nevus 06/22/2014   R post medial mid thigh   Hx of dysplastic nevus 12/06/2014   R post waistline, mild atypia   Hx of dysplastic nevus 12/06/2014   R sup lat buttocks, mild atypia   Hypothyroidism    pt. denies   Melanoma ([place]) 12/29/2009   R mid back, Melanoma IS   Osteopenia    Squamous cell carcinoma of skin 11/16/2021   SCCIS R tricep - ED&C 01/30/22  Uterine fibroid    Past Surgical History:  Procedure Laterality Date   ABDOMINAL HYSTERECTOMY  1995   partial, fibroids, Dr. Nori Riis   COLONOSCOPY  2009   FLEXIBLE SIGMOIDOSCOPY  2002   HEMORRHOID BANDING     skin cancer removal     melanoma upper abdomen/back   BCC multiple    URETHRAL DILATION  1988   Dr. Leory Plowman   Family History  Problem Relation Age of Onset   Hypertension Mother    Hyperlipidemia Mother    Alcohol abuse Father    Cirrhosis Father    Colon polyps Father    Colon polyps Sister    Hypertension Sister    Hypertension Maternal Aunt    Lung cancer Paternal Aunt    Colon cancer Neg Hx    Esophageal cancer Neg Hx    Stomach cancer Neg Hx    Rectal cancer Neg Hx     Social History   Socioeconomic History   Marital status: Divorced    Spouse name: Not on file   Number of children: 0   Years of education: Not on file   Highest education level: Not on file  Occupational History   Occupation: Civil Service fast streamer, Optician, dispensing: [employer]  Tobacco Use   Smoking status: Former   Smokeless tobacco: Never   Tobacco comments:    quit 25 years ago  Media planner   Vaping Use: Never used  Substance and Sexual Activity   Alcohol use: No   Drug use: No   Sexual activity: Not on file  Other Topics Concern   Not on file  Social History Narrative   Divorced, Civil Service fast streamer assistance. One caffeinated beverage daily. No children.   Lives alone, next door to her mother. Has a dog. Retiring 2019, will continue to work part time.    Social Determinants of Health   Financial Resource Strain: Low Risk  (05/09/2022)   Overall Financial Resource Strain (CARDIA)    Difficulty of Paying Living Expenses: Not hard at all  Food Insecurity: No Food Insecurity (05/09/2022)   Hunger Vital Sign    Worried About Running Out of Food in the Last Year: Never true    Ran Out of Food in the Last Year: Never true  Transportation Needs: No Transportation Needs (05/09/2022)   PRAPARE - Hydrologist (Medical): No    Lack of Transportation (Non-Medical): No  Physical Activity: Sufficiently Active (05/09/2022)   Exercise Vital Sign    Days of Exercise per Week: 4 days    Minutes of Exercise per Session: 50 min  Stress: No Stress Concern Present (05/09/2022)   Minor    Feeling of Stress : Not at all  Social Connections: Moderately Isolated (05/09/2022)   Social Connection and Isolation Panel [NHANES]    Frequency of Communication with Friends and Family: More than three times a week    Frequency of Social Gatherings with Friends and Family: More than three times a week     Attends Religious Services: Never    Marine scientist or Organizations: Yes    Attends Music therapist: More than 4 times per year    Marital Status: Divorced    Tobacco Counseling Counseling given: Not Answered Tobacco comments: quit 25 years ago   Clinical Intake:  Pre-visit preparation completed: Yes  Pain : No/denies pain     Nutritional Risks: None Diabetes: No  How often do you need to have someone help you when you read instructions, pamphlets, or other written materials from your doctor or pharmacy?: 1 - Never  Diabetic? no  Interpreter Needed?: No  Information entered by :: C.Siah Kannan LPN   Activities of Daily Living    05/09/2022    9:02 AM  In your present state of health, do you have any difficulty performing the following activities:  Hearing? 0  Vision? 0  Difficulty concentrating or making decisions? 0  Walking or climbing stairs? 0  Dressing or bathing? 0  Doing errands, shopping? 0  Preparing Food and eating ? N  Using the Toilet? N  In the past six months, have you accidently leaked urine? N  Do you have problems with loss of bowel control? N  Managing your Medications? N  Managing your Finances? N  Housekeeping or managing your Housekeeping? N    Patient Care Team: Tower, Wynelle Fanny, MD as PCP - General (Family Medicine)  Indicate any recent Medical Services you may have received from other than [facility] providers in the past year (date may be approximate).     Assessment:   This is a routine wellness examination for Glorianne.  Hearing/Vision screen Hearing Screening - Comments:: No aids Vision Screening - Comments:: Glasses -[facility]  Dietary issues and exercise activities discussed: Current Exercise Habits: Structured exercise class, Type of exercise: stretching;walking (Silver Sneakers), Time (Minutes): 45, Frequency (Times/Week): 4, Weekly Exercise (Minutes/Week): 180, Intensity: Moderate, Exercise limited  by: None identified   Goals Addressed             This Visit's Progress    Patient Stated       Lose 5-10 pounds.       Depression Screen    05/09/2022    9:01 AM 05/05/2021    9:57 AM 05/01/2021   12:08 PM 10/22/2016    2:45 PM 12/30/2012    2:40 PM  PHQ 2/9 Scores  PHQ - 2 Score 0 0 0 0 0  Exception Documentation    Patient refusal     Fall Risk    05/09/2022    9:02 AM 05/05/2021    9:57 AM 05/01/2021   12:08 PM 10/22/2016    2:45 PM 12/30/2012    2:40 PM  Fall Risk   Falls in the past year? 0 0 0 No No  Number falls in past yr: 0      Injury with Fall? 0      Risk for fall due to : No Fall Risks      Follow up Falls prevention discussed;Falls evaluation completed Falls evaluation completed Falls evaluation completed      FALL RISK PREVENTION PERTAINING TO THE HOME:  Any stairs in or around the home? No  If so, are there any without handrails? No  Home free of loose throw rugs in walkways, pet beds, electrical cords, etc? Yes  Adequate lighting in your home to reduce risk of falls? Yes   ASSISTIVE DEVICES UTILIZED TO PREVENT FALLS:  Life alert? No  Use of a cane, walker or w/c? No  Grab bars in the bathroom? No  Shower chair or bench in shower? No  Elevated toilet seat or a handicapped toilet? No    Cognitive Function:        05/09/2022    9:04 AM  6CIT Screen  What Year? 0 points  What month? 0 points  What time? 0 points  Count back from 20 0  points  Months in reverse 0 points  Repeat phrase 0 points  Total Score 0 points    Immunizations Immunization History  Administered Date(s) Administered   Influenza, Seasonal, Injecte, Preservative Fre 10/14/2020   PFIZER(Purple Top)SARS-COV-2 Vaccination 05/15/2019, 06/10/2019, 01/22/2020   Pneumococcal Polysaccharide-23 06/08/2021   Td 02/13/1995, 07/15/2008   Zoster Recombinat (Shingrix) 09/02/2016, 11/11/2016    TDAP status: Due, Education has been provided regarding the importance of this  vaccine. Advised may receive this vaccine at local pharmacy or Health Dept. Aware to provide a copy of the vaccination record if obtained from local pharmacy or Health Dept. Verbalized acceptance and understanding.  Flu Vaccine status: Up to date  Pneumococcal vaccine status: Due, Education has been provided regarding the importance of this vaccine. Advised may receive this vaccine at local pharmacy or Health Dept. Aware to provide a copy of the vaccination record if obtained from local pharmacy or Health Dept. Verbalized acceptance and understanding.  Covid-19 vaccine status: Information provided on how to obtain vaccines.   Qualifies for Shingles Vaccine? Yes   Zostavax completed No   Shingrix Completed?: Yes  Screening Tests Health Maintenance  Topic Date Due   DTaP/Tdap/Td (3 - Tdap) 07/16/2018   DEXA SCAN  Never done   MAMMOGRAM  08/17/2021   INFLUENZA VACCINE  09/12/2021   COVID-19 Vaccine (6 - 2023-24 season) 10/13/2021   Pneumonia Vaccine 63+ Years old (2 of 2 - PCV) 06/09/2022   Medicare Annual Wellness (AWV)  05/09/2023   COLONOSCOPY (Pts 45-54yrs Insurance coverage will need to be confirmed)  04/02/2027   Hepatitis C Screening  Completed   Zoster Vaccines- Shingrix  Completed   HPV VACCINES  Aged Out    Health Maintenance  Health Maintenance Due  Topic Date Due   DTaP/Tdap/Td (3 - Tdap) 07/16/2018   DEXA SCAN  Never done   MAMMOGRAM  08/17/2021   INFLUENZA VACCINE  09/12/2021   COVID-19 Vaccine (6 - 2023-24 season) 10/13/2021   Pneumonia Vaccine 52+ Years old (2 of 2 - PCV) 06/09/2022    Colorectal cancer screening: Type of screening: Colonoscopy. Completed 04/01/17. Repeat every 10 years  Mammogram status: Completed 07/23 per pt. Repeat every year. Done at OB/GYN office.  Bone Density status: Completed 07/23 per pt. Results reflect: Bone density results: OSTEOPENIA. Repeat every 2 years. Done at OB/GYN office  Lung Cancer Screening: (Low Dose CT Chest  recommended if Age 42-80 years, 30 pack-year currently smoking OR have quit w/in 15years.) does not qualify.   Lung Cancer Screening Referral: no  Additional Screening:  Hepatitis C Screening: does qualify; Completed 11/11/16  Vision Screening: Recommended annual ophthalmology exams for early detection of glaucoma and other disorders of the eye. Is the patient up to date with their annual eye exam?  Yes  Who is the provider or what is the name of the office in which the patient attends annual eye exams? [facility] If pt is not established with a provider, would they like to be referred to a provider to establish care? No .   Dental Screening: Recommended annual dental exams for proper oral hygiene  Community Resource Referral / Chronic Care Management: CRR required this visit?  No   CCM required this visit?  No      Plan:     I have personally reviewed and noted the following in the patient's chart:   Medical and social history Use of alcohol, tobacco or illicit drugs  Current medications and supplements including opioid prescriptions.  Patient is not currently taking opioid prescriptions. Functional ability and status Nutritional status Physical activity Advanced directives List of other physicians Hospitalizations, surgeries, and ER visits in previous 12 months Vitals Screenings to include cognitive, depression, and falls Referrals and appointments  In addition, I have reviewed and discussed with patient certain preventive protocols, quality metrics, and best practice recommendations. A written personalized care plan for preventive services as well as general preventive health recommendations were provided to patient.     Lebron Conners, LPN   [id number]   Nurse Notes: Patient will request last mammogram and dexa scan results be sent to PCP.

## 2022-05-09 NOTE — Assessment & Plan Note (Signed)
bp in fair control at this time  BP Readings from Last 1 Encounters:  05/09/22 114/70   No changes needed Most recent labs reviewed  Disc lifstyle change with low sodium diet and exercise  Plan to continue lisinopril 2.5 mg daily and good habits

## 2022-05-09 NOTE — Progress Notes (Signed)
Subjective:    Patient ID: Terri Espinoza, female    DOB: 1955/11/15, 67 y.o.   MRN: [id number]  HPI Here for health maintenance exam and to review chronic medical problems   Wt Readings from Last 3 Encounters:  05/09/22 122 lb 6 oz (55.5 kg)  05/09/22 120 lb (54.4 kg)  05/05/21 119 lb 2 oz (54 kg)   25.80 kg/m  Vitals:   05/09/22 1451  BP: 114/70  Pulse: 85  Temp: 97.7 F (36.5 C)  SpO2: 96%   Feeling ok  Taking care of herself   Immunization History  Administered Date(s) Administered   Influenza, Seasonal, Injecte, Preservative Fre 10/14/2020   PFIZER(Purple Top)SARS-COV-2 Vaccination 05/15/2019, 06/10/2019, 01/22/2020   Pneumococcal Polysaccharide-23 06/08/2021   Td 02/13/1995, 07/15/2008   Zoster Recombinat (Shingrix) 09/02/2016, 11/11/2016   Health Maintenance Due  Topic Date Due   DTaP/Tdap/Td (3 - Tdap) 07/16/2018   DEXA SCAN  Never done   Pneumonia Vaccine 76+ Years old (2 of 2 - PCV) 06/09/2022   Had amw today  They are sending for last gyn info  Taking estrogen 1 mg daily   Gyn visit in July   Mammogram 08/2021 Self breast exam : no lumps   Colonoscopy 03/2017 10 y recall   Tetanus shot : will get outside office   Dexa  at gyn - this summer - was better than last time / has osteopenia  Falls: none  Fractures:none  Supplements : vitamin D , and Ca       Exercise : silver sneaker classes, yoga and circuit training with strength training Likes to walk also   Dermatology care : up to date  H/o skin cancers  Had 2 lesions removed in the past year  Uses sun protection    Mood    05/09/2022    9:01 AM 05/05/2021    9:57 AM 05/01/2021   12:08 PM 10/22/2016    2:45 PM 12/30/2012    2:40 PM  Depression screen PHQ 2/9  Decreased Interest 0 0 0 0 0  Down, Depressed, Hopeless 0 0 0 0 0  PHQ - 2 Score 0 0 0 0 0   HTN bp is stable today  No cp or palpitations or headaches or edema  No side effects to medicines  BP Readings from Last 3  Encounters:  05/09/22 114/70  05/05/21 110/70  05/01/21 106/80    Lisinopril 2.5 mg daily   Last metabolic panel Lab Results  Component Value Date   GLUCOSE 84 05/02/2022   NA 137 05/02/2022   K 4.2 05/02/2022   CL 101 05/02/2022   CO2 29 05/02/2022   BUN 19 05/02/2022   CREATININE 1.06 05/02/2022   CALCIUM 9.1 05/02/2022   PROT 6.6 05/02/2022   ALBUMIN 4.0 05/02/2022   BILITOT 0.5 05/02/2022   ALKPHOS 56 05/02/2022   AST 16 05/02/2022   ALT 11 05/02/2022   Sees nephrology for CKD GFRis 54.6   Good at drinking water Has annual follow up     Subclinical hypothyroidism Hypothyroidism  Pt has no clinical changes No change in energy level/ hair or skin/ edema and no tremor Lab Results  Component Value Date   TSH 11.76 (H) 05/02/2022    Some dry skin and hair      Hyperlipidemia Lab Results  Component Value Date   CHOL 250 (H) 05/02/2022   CHOL 194 05/05/2021   CHOL 228 (H) 03/30/2020   Lab Results  Component Value Date  HDL 69.80 05/02/2022   HDL 53.80 05/05/2021   HDL 66.50 03/30/2020   Lab Results  Component Value Date   LDLCALC 158 (H) 05/02/2022   LDLCALC 109 (H) 05/05/2021   LDLCALC 132 (H) 03/30/2020   Lab Results  Component Value Date   TRIG 111.0 05/02/2022   TRIG 155.0 (H) 05/05/2021   TRIG 150.0 (H) 03/30/2020   Lab Results  Component Value Date   CHOLHDL 4 05/02/2022   CHOLHDL 4 05/05/2021   CHOLHDL 3 03/30/2020   Lab Results  Component Value Date   LDLDIRECT 149.9 12/30/2012   LDLDIRECT 140.0 12/03/2011   LDLDIRECT 128.4 09/04/2010   LDL is up this tim e Diet : was eating a lot of cihps and fried food  Now holding off  Eating more veggies   The 10-year ASCVD risk score (Arnett DK, et al., 2019) is: 6.7%   Values used to calculate the score:     Age: 81 years     Sex: Female     Is Non-Hispanic African American: No     Diabetic: No     Tobacco smoker: No     Systolic Blood Pressure: 99991111 mmHg     Is BP treated:  Yes     HDL Cholesterol: 69.8 mg/dL     Total Cholesterol: 250 mg/dL  Patient Active Problem List   Diagnosis Date Noted   Osteopenia 05/09/2022   Welcome to Medicare preventive visit 05/07/2021   CKD (chronic kidney disease) stage 2, GFR 60-89 ml/min 09/28/2019   Prolapsed internal hemorrhoids, grade 2 09/07/2013   Generalized anxiety disorder 03/05/2013   Subclinical hypothyroidism 04/24/2012   HLD (hyperlipidemia) 04/24/2012   Routine general medical examination at a health care facility 09/12/2010   INCONTINENCE, URGE 12/19/2009   Allergic rhinitis 08/08/2009   DEPRESSION, MILD 01/29/2007   Essential hypertension 01/29/2007   Past Medical History:  Diagnosis Date   Allergic rhinitis    Allergy    Anal fissure    Anxiety    Arthritis    Basal cell carcinoma 06/22/2020   Right prox lat calf near knee - [facility] 07/05/20   Basal cell carcinoma (BCC) 11/16/2021   left abdomen at costal area - ED&C   Cancer ([place])    melanoma on back and upper abdomen  BCC also multiple   Dysplastic nevus 09/15/2019   R mid medial scapula, moderate to severe, shave removal   Hemorrhoids    HLD (hyperlipidemia)    HTN (hypertension)    Hx of basal cell carcinoma 01/11/2006   chest   Hx of basal cell carcinoma 12/08/2009   L anticubital   Hx of basal cell carcinoma 12/29/2013   L proximal antecubital   Hx of basal cell carcinoma 12/23/2014   R low back   Hx of dysplastic nevus 01/24/2007   R upper back, moderate atypia   Hx of dysplastic nevus 06/22/2014   R post medial mid thigh   Hx of dysplastic nevus 12/06/2014   R post waistline, mild atypia   Hx of dysplastic nevus 12/06/2014   R sup lat buttocks, mild atypia   Hypothyroidism    pt. denies   Melanoma ([place]) 12/29/2009   R mid back, Melanoma IS   Osteopenia    Squamous cell carcinoma of skin 11/16/2021   SCCIS R tricep - ED&C 01/30/22   Uterine fibroid    Past Surgical History:  Procedure Laterality Date   ABDOMINAL  HYSTERECTOMY  1995   partial, fibroids, Dr.  Neal   COLONOSCOPY  2009   FLEXIBLE SIGMOIDOSCOPY  2002   HEMORRHOID BANDING     skin cancer removal     melanoma upper abdomen/back   BCC multiple    URETHRAL DILATION  1988   Dr. Leory Plowman   Social History   Tobacco Use   Smoking status: Former   Smokeless tobacco: Never   Tobacco comments:    quit 25 years ago  Vaping Use   Vaping Use: Never used  Substance Use Topics   Alcohol use: No   Drug use: No   Family History  Problem Relation Age of Onset   Hypertension Mother    Hyperlipidemia Mother    Alcohol abuse Father    Cirrhosis Father    Colon polyps Father    Colon polyps Sister    Hypertension Sister    Hypertension Maternal Aunt    Lung cancer Paternal Aunt    Colon cancer Neg Hx    Esophageal cancer Neg Hx    Stomach cancer Neg Hx    Rectal cancer Neg Hx    No Known Allergies Current Outpatient Medications on File Prior to Visit  Medication Sig Dispense Refill   Calcium-Magnesium-Vitamin D (CALCIUM 500 PO) Take by mouth.     cetirizine (ZYRTEC) 10 MG chewable tablet Chew 10 mg by mouth daily.     cholecalciferol (VITAMIN D) 1000 UNITS tablet Take 5,000 Units by mouth daily.     estradiol (ESTRACE) 1 MG tablet Take 1 mg by mouth daily.     fluticasone (FLONASE) 50 MCG/ACT nasal spray Place 2 sprays into both nostrils daily. 16 g 6   lisinopril (ZESTRIL) 2.5 MG tablet Take 2.5 mg by mouth daily.     loratadine (CLARITIN) 5 MG chewable tablet Chew 5 mg by mouth daily.     Magnesium 100 MG CAPS Take by mouth.     Multiple Vitamin (MULTIVITAMIN) tablet Take 1 tablet by mouth daily.     Multiple Vitamins-Minerals (EMERGEN-C BLUE PO) Take by mouth.     Multiple Vitamins-Minerals (ZINC PO) Take by mouth.     No current facility-administered medications on file prior to visit.     Review of Systems  Constitutional:  Negative for activity change, appetite change, fatigue, fever and unexpected weight change.  HENT:   Negative for congestion, ear pain, rhinorrhea, sinus pressure and sore throat.   Eyes:  Negative for pain, redness and visual disturbance.  Respiratory:  Negative for cough, shortness of breath and wheezing.   Cardiovascular:  Negative for chest pain and palpitations.  Gastrointestinal:  Negative for abdominal pain, blood in stool, constipation and diarrhea.  Endocrine: Negative for polydipsia and polyuria.  Genitourinary:  Negative for dysuria, frequency and urgency.  Musculoskeletal:  Negative for arthralgias, back pain and myalgias.  Skin:  Negative for pallor and rash.  Allergic/Immunologic: Negative for environmental allergies.  Neurological:  Negative for dizziness, syncope and headaches.  Hematological:  Negative for adenopathy. Does not bruise/bleed easily.  Psychiatric/Behavioral:  Negative for decreased concentration and dysphoric mood. The patient is not nervous/anxious.        Objective:   Physical Exam Constitutional:      General: She is not in acute distress.    Appearance: Normal appearance. She is well-developed. She is not ill-appearing or diaphoretic.  HENT:     Head: Normocephalic and atraumatic.     Right Ear: Tympanic membrane, ear canal and external ear normal.     Left Ear: Tympanic membrane, ear  canal and external ear normal.     Nose: Nose normal. No congestion.     Mouth/Throat:     Mouth: Mucous membranes are moist.     Pharynx: Oropharynx is clear. No posterior oropharyngeal erythema.  Eyes:     General: No scleral icterus.    Extraocular Movements: Extraocular movements intact.     Conjunctiva/sclera: Conjunctivae normal.     Pupils: Pupils are equal, round, and reactive to light.  Neck:     Thyroid: No thyromegaly.     Vascular: No carotid bruit or JVD.  Cardiovascular:     Rate and Rhythm: Normal rate and regular rhythm.     Pulses: Normal pulses.     Heart sounds: Normal heart sounds.     No gallop.  Pulmonary:     Effort: Pulmonary effort  is normal. No respiratory distress.     Breath sounds: Normal breath sounds. No wheezing.     Comments: Good air exch Chest:     Chest wall: No tenderness.  Abdominal:     General: Bowel sounds are normal. There is no distension or abdominal bruit.     Palpations: Abdomen is soft. There is no mass.     Tenderness: There is no abdominal tenderness.     Hernia: No hernia is present.  Genitourinary:    Comments: Breast exam: No mass, nodules, thickening, tenderness, bulging, retraction, inflamation, nipple discharge or skin changes noted.  No axillary or clavicular LA.     Musculoskeletal:        General: No tenderness. Normal range of motion.     Cervical back: Normal range of motion and neck supple. No rigidity. No muscular tenderness.     Right lower leg: No edema.     Left lower leg: No edema.     Comments: No kyphosis   Lymphadenopathy:     Cervical: No cervical adenopathy.  Skin:    General: Skin is warm and dry.     Coloration: Skin is not pale.     Findings: No erythema or rash.     Comments: Solar lentigines diffusely   Neurological:     Mental Status: She is alert. Mental status is at baseline.     Cranial Nerves: No cranial nerve deficit.     Motor: No abnormal muscle tone.     Coordination: Coordination normal.     Gait: Gait normal.     Deep Tendon Reflexes: Reflexes are normal and symmetric.  Psychiatric:        Mood and Affect: Mood normal.        Cognition and Memory: Cognition and memory normal.           Assessment & Plan:   Problem List Items Addressed This Visit       Cardiovascular and Mediastinum   Essential hypertension    bp in fair control at this time  BP Readings from Last 1 Encounters:  05/09/22 114/70  No changes needed Most recent labs reviewed  Disc lifstyle change with low sodium diet and exercise  Plan to continue lisinopril 2.5 mg daily and good habits        Endocrine   Subclinical hypothyroidism    Lab Results  Component  Value Date   TSH 11.76 (H) 05/02/2022  No significant symptoms  Discussed what to watch for  Prefers not to take thyroid supplementation Handout given  No change on exam        Musculoskeletal and Integument  Osteopenia    Per pt had dexa this summer at [facility]   Enc ca and D Good exercise  No falls or fx      Other nonthrombocytopenic purpura (HCC)    On hands on/off Treated by dermatology        Genitourinary   CKD (chronic kidney disease) stage 2, GFR 60-89 ml/min    Stable GFR 54.6  Sees nephrology yearly        Other   HLD (hyperlipidemia)    Disc goals for lipids and reasons to control them Rev last labs with pt Rev low sat fat diet in detail With worse eating LDL went up from 109 to 158  Some fam hx  Made plan to re check this in 3 mo after lower fat diet  Handout given  Disc opt to check coronary ca score also-will consider   If not imp consider statin      Routine general medical examination at a health care facility - Primary    Reviewed health habits including diet and exercise and skin cancer prevention Reviewed appropriate screening tests for age  Also reviewed health mt list, fam hx and immunization status , as well as social and family history   See HPI Labs reviewed  Sent for gyn results from July  / pap and dexa  Colonoscopy utd 03/2017 with 10 y recall  Mammogram utd 08/2021  Plans to get a tetanus shot in pharmacy or health dept Utd derm care /uses sun protection  Mood is good with PHQ of 0   Great exercise habits

## 2022-05-09 NOTE — Assessment & Plan Note (Signed)
Stable GFR 54.6  Sees nephrology yearly

## 2022-05-09 NOTE — Assessment & Plan Note (Signed)
Lab Results  Component Value Date   TSH 11.76 (H) 05/02/2022   No significant symptoms  Discussed what to watch for  Prefers not to take thyroid supplementation Handout given  No change on exam

## 2022-05-09 NOTE — Patient Instructions (Signed)
Terri Espinoza , Thank you for taking time to come for your Medicare Wellness Visit. I appreciate your ongoing commitment to your health goals. Please review the following plan we discussed and let me know if I can assist you in the future.   These are the goals we discussed:  Goals      Patient Stated     Lose 5-10 pounds.        This is a list of the screening recommended for you and due dates:  Health Maintenance  Topic Date Due   DTaP/Tdap/Td vaccine (3 - Tdap) 07/16/2018   DEXA scan (bone density measurement)  Never done   Mammogram  08/17/2021   Flu Shot  09/12/2021   COVID-19 Vaccine (6 - 2023-24 season) 10/13/2021   Pneumonia Vaccine (2 of 2 - PCV) 06/09/2022   Medicare Annual Wellness Visit  05/09/2023   Colon Cancer Screening  04/02/2027   Hepatitis C Screening: USPSTF Recommendation to screen - Ages 18-79 yo.  Completed   Zoster (Shingles) Vaccine  Completed   HPV Vaccine  Aged Out    Advanced directives: Patient has Living Will   Conditions/risks identified: none  Next appointment: Follow up in one year for your annual wellness visit 05/13/23 @ 2pm telephone visit.   Preventive Care 46 Years and Older, Female Preventive care refers to lifestyle choices and visits with your health care provider that can promote health and wellness. What does preventive care include? A yearly physical exam. This is also called an annual well check. Dental exams once or twice a year. Routine eye exams. Ask your health care provider how often you should have your eyes checked. Personal lifestyle choices, including: Daily care of your teeth and gums. Regular physical activity. Eating a healthy diet. Avoiding tobacco and drug use. Limiting alcohol use. Practicing safe sex. Taking low-dose aspirin every day. Taking vitamin and mineral supplements as recommended by your health care provider. What happens during an annual well check? The services and screenings done by your health  care provider during your annual well check will depend on your age, overall health, lifestyle risk factors, and family history of disease. Counseling  Your health care provider may ask you questions about your: Alcohol use. Tobacco use. Drug use. Emotional well-being. Home and relationship well-being. Sexual activity. Eating habits. History of falls. Memory and ability to understand (cognition). Work and work Statistician. Reproductive health. Screening  You may have the following tests or measurements: Height, weight, and BMI. Blood pressure. Lipid and cholesterol levels. These may be checked every 5 years, or more frequently if you are over 80 years old. Skin check. Lung cancer screening. You may have this screening every year starting at age 43 if you have a 30-pack-year history of smoking and currently smoke or have quit within the past 15 years. Fecal occult blood test (FOBT) of the stool. You may have this test every year starting at age 18. Flexible sigmoidoscopy or colonoscopy. You may have a sigmoidoscopy every 5 years or a colonoscopy every 10 years starting at age 49. Hepatitis C blood test. Hepatitis B blood test. Sexually transmitted disease (STD) testing. Diabetes screening. This is done by checking your blood sugar (glucose) after you have not eaten for a while (fasting). You may have this done every 1-3 years. Bone density scan. This is done to screen for osteoporosis. You may have this done starting at age 72. Mammogram. This may be done every 1-2 years. Talk to your health care provider  about how often you should have regular mammograms. Talk with your health care provider about your test results, treatment options, and if necessary, the need for more tests. Vaccines  Your health care provider may recommend certain vaccines, such as: Influenza vaccine. This is recommended every year. Tetanus, diphtheria, and acellular pertussis (Tdap, Td) vaccine. You may need a Td  booster every 10 years. Zoster vaccine. You may need this after age 30. Pneumococcal 13-valent conjugate (PCV13) vaccine. One dose is recommended after age 53. Pneumococcal polysaccharide (PPSV23) vaccine. One dose is recommended after age 103. Talk to your health care provider about which screenings and vaccines you need and how often you need them. This information is not intended to replace advice given to you by your health care provider. Make sure you discuss any questions you have with your health care provider. Document Released: 02/25/2015 Document Revised: 10/19/2015 Document Reviewed: 11/30/2014 Elsevier Interactive Patient Education  2017 [place] Prevention in the Home Falls can cause injuries. They can happen to people of all ages. There are many things you can do to make your home safe and to help prevent falls. What can I do on the outside of my home? Regularly fix the edges of walkways and driveways and fix any cracks. Remove anything that might make you trip as you walk through a door, such as a raised step or threshold. Trim any bushes or trees on the path to your home. Use bright outdoor lighting. Clear any walking paths of anything that might make someone trip, such as rocks or tools. Regularly check to see if handrails are loose or broken. Make sure that both sides of any steps have handrails. Any raised decks and porches should have guardrails on the edges. Have any leaves, snow, or ice cleared regularly. Use sand or salt on walking paths during winter. Clean up any spills in your garage right away. This includes oil or grease spills. What can I do in the bathroom? Use night lights. Install grab bars by the toilet and in the tub and shower. Do not use towel bars as grab bars. Use non-skid mats or decals in the tub or shower. If you need to sit down in the shower, use a plastic, non-slip stool. Keep the floor dry. Clean up any water that spills on the floor  as soon as it happens. Remove soap buildup in the tub or shower regularly. Attach bath mats securely with double-sided non-slip rug tape. Do not have throw rugs and other things on the floor that can make you trip. What can I do in the bedroom? Use night lights. Make sure that you have a light by your bed that is easy to reach. Do not use any sheets or blankets that are too big for your bed. They should not hang down onto the floor. Have a firm chair that has side arms. You can use this for support while you get dressed. Do not have throw rugs and other things on the floor that can make you trip. What can I do in the kitchen? Clean up any spills right away. Avoid walking on wet floors. Keep items that you use a lot in easy-to-reach places. If you need to reach something above you, use a strong step stool that has a grab bar. Keep electrical cords out of the way. Do not use floor polish or wax that makes floors slippery. If you must use wax, use non-skid floor wax. Do not have throw rugs and  other things on the floor that can make you trip. What can I do with my stairs? Do not leave any items on the stairs. Make sure that there are handrails on both sides of the stairs and use them. Fix handrails that are broken or loose. Make sure that handrails are as long as the stairways. Check any carpeting to make sure that it is firmly attached to the stairs. Fix any carpet that is loose or worn. Avoid having throw rugs at the top or bottom of the stairs. If you do have throw rugs, attach them to the floor with carpet tape. Make sure that you have a light switch at the top of the stairs and the bottom of the stairs. If you do not have them, ask someone to add them for you. What else can I do to help prevent falls? Wear shoes that: Do not have high heels. Have rubber bottoms. Are comfortable and fit you well. Are closed at the toe. Do not wear sandals. If you use a stepladder: Make sure that it is  fully opened. Do not climb a closed stepladder. Make sure that both sides of the stepladder are locked into place. Ask someone to hold it for you, if possible. Clearly mark and make sure that you can see: Any grab bars or handrails. First and last steps. Where the edge of each step is. Use tools that help you move around (mobility aids) if they are needed. These include: Canes. Walkers. Scooters. Crutches. Turn on the lights when you go into a dark area. Replace any light bulbs as soon as they burn out. Set up your furniture so you have a clear path. Avoid moving your furniture around. If any of your floors are uneven, fix them. If there are any pets around you, be aware of where they are. Review your medicines with your doctor. Some medicines can make you feel dizzy. This can increase your chance of falling. Ask your doctor what other things that you can do to help prevent falls. This information is not intended to replace advice given to you by your health care provider. Make sure you discuss any questions you have with your health care provider. Document Released: 11/25/2008 Document Revised: 07/07/2015 Document Reviewed: 03/05/2014 Elsevier Interactive Patient Education  2017 [employer].

## 2022-05-09 NOTE — Patient Instructions (Addendum)
Get your tetanus shot at the pharmacy or health department   Here is a handout on hypothyroidism If you become symptomatic we can treat this  Fatigue Hair and skin changes Mood changes  Memory changes   Watch out for these things Let us know if/when you want treatment    Keep working on diet for cholesterol Avoid red meat/ fried foods/ egg yolks/ fatty breakfast meats/ butter, cheese and high fat dairy/ and shellfish    Let's check this in 3 months   If you are ever interested in a coronary calcium score test let us know It tends to run about 100-200$   Take care of yourself  Keep up the dermatology care

## 2022-05-09 NOTE — Assessment & Plan Note (Signed)
Disc goals for lipids and reasons to control them Rev last labs with pt Rev low sat fat diet in detail With worse eating LDL went up from 109 to 158  Some fam hx  Made plan to re check this in 3 mo after lower fat diet  Handout given  Disc opt to check coronary ca score also-will consider   If not imp consider statin

## 2022-05-09 NOTE — Assessment & Plan Note (Signed)
On hands on/off Treated by dermatology

## 2022-05-09 NOTE — Assessment & Plan Note (Signed)
Reviewed health habits including diet and exercise and skin cancer prevention Reviewed appropriate screening tests for age  Also reviewed health mt list, fam hx and immunization status , as well as social and family history   See HPI Labs reviewed  Sent for gyn results from July  / pap and dexa  Colonoscopy utd 03/2017 with 10 y recall  Mammogram utd 08/2021  Plans to get a tetanus shot in pharmacy or health dept Utd derm care /uses sun protection  Mood is good with PHQ of 0   Great exercise habits

## 2022-05-09 NOTE — Assessment & Plan Note (Signed)
Per pt had dexa this summer at [facility] for report   Enc ca and D Good exercise  No falls or fx

## 2022-07-04 ENCOUNTER — Ambulatory Visit (INDEPENDENT_AMBULATORY_CARE_PROVIDER_SITE_OTHER): Payer: HMO | Admitting: Dermatology

## 2022-07-04 VITALS — BP 100/65

## 2022-07-04 DIAGNOSIS — D229 Melanocytic nevi, unspecified: Secondary | ICD-10-CM

## 2022-07-04 DIAGNOSIS — D1801 Hemangioma of skin and subcutaneous tissue: Secondary | ICD-10-CM | POA: Diagnosis not present

## 2022-07-04 DIAGNOSIS — D2262 Melanocytic nevi of left upper limb, including shoulder: Secondary | ICD-10-CM | POA: Diagnosis not present

## 2022-07-04 DIAGNOSIS — W908XXA Exposure to other nonionizing radiation, initial encounter: Secondary | ICD-10-CM

## 2022-07-04 DIAGNOSIS — L814 Other melanin hyperpigmentation: Secondary | ICD-10-CM

## 2022-07-04 DIAGNOSIS — L821 Other seborrheic keratosis: Secondary | ICD-10-CM

## 2022-07-04 DIAGNOSIS — Z8589 Personal history of malignant neoplasm of other organs and systems: Secondary | ICD-10-CM

## 2022-07-04 DIAGNOSIS — Z86007 Personal history of in-situ neoplasm of skin: Secondary | ICD-10-CM

## 2022-07-04 DIAGNOSIS — L578 Other skin changes due to chronic exposure to nonionizing radiation: Secondary | ICD-10-CM | POA: Diagnosis not present

## 2022-07-04 DIAGNOSIS — D492 Neoplasm of unspecified behavior of bone, soft tissue, and skin: Secondary | ICD-10-CM

## 2022-07-04 DIAGNOSIS — Z85820 Personal history of malignant melanoma of skin: Secondary | ICD-10-CM

## 2022-07-04 DIAGNOSIS — Z85828 Personal history of other malignant neoplasm of skin: Secondary | ICD-10-CM | POA: Diagnosis not present

## 2022-07-04 DIAGNOSIS — L820 Inflamed seborrheic keratosis: Secondary | ICD-10-CM | POA: Diagnosis not present

## 2022-07-04 DIAGNOSIS — Z86018 Personal history of other benign neoplasm: Secondary | ICD-10-CM | POA: Diagnosis not present

## 2022-07-04 DIAGNOSIS — Z1283 Encounter for screening for malignant neoplasm of skin: Secondary | ICD-10-CM

## 2022-07-04 DIAGNOSIS — X32XXXA Exposure to sunlight, initial encounter: Secondary | ICD-10-CM | POA: Diagnosis not present

## 2022-07-04 DIAGNOSIS — Z86006 Personal history of melanoma in-situ: Secondary | ICD-10-CM | POA: Diagnosis not present

## 2022-07-04 NOTE — Progress Notes (Signed)
Follow-Up Visit   Subjective  Terri Espinoza is a 67 y.o. female who presents for the following: Skin Cancer Screening and Full Body Skin Exam, hx of Melanoma IS, hx of BCCs, hx of SCC R tricep, hx of Dysplastic nevi  The patient presents for Total-Body Skin Exam (TBSE) for skin cancer screening and mole check. The patient has spots, moles and lesions to be evaluated, some may be new or changing and the patient has concerns that these could be cancer.    The following portions of the chart were reviewed this encounter and updated as appropriate: medications, allergies, medical history  Review of Systems:  No other skin or systemic complaints except as noted in HPI or Assessment and Plan.  Objective  Well appearing patient in no apparent distress; mood and affect are within normal limits.  A full examination was performed including scalp, head, eyes, ears, nose, lips, neck, chest, axillae, abdomen, back, buttocks, bilateral upper extremities, bilateral lower extremities, hands, feet, fingers, toes, fingernails, and toenails. All findings within normal limits unless otherwise noted below.   Relevant physical exam findings are noted in the Assessment and Plan.  L scapula 0.3cm dark brown macule     R pretibial x 1 Stuck on waxy paps with erythema    Assessment & Plan   LENTIGINES, SEBORRHEIC KERATOSES, HEMANGIOMAS - Benign normal skin lesions - Benign-appearing - Call for any changes  MELANOCYTIC NEVI - Tan-brown and/or pink-flesh-colored symmetric macules and papules - Benign appearing on exam today - Observation - Call clinic for new or changing moles - Recommend daily use of broad spectrum spf 30+ sunscreen to sun-exposed areas.   ACTINIC DAMAGE - Chronic condition, secondary to cumulative UV/sun exposure - diffuse scaly erythematous macules with underlying dyspigmentation - Recommend daily broad spectrum sunscreen SPF 30+ to sun-exposed areas, reapply every 2  hours as needed.  - Staying in the shade or wearing long sleeves, sun glasses (UVA+UVB protection) and wide brim hats (4-inch brim around the entire circumference of the hat) are also recommended for sun protection.  - Call for new or changing lesions.  SKIN CANCER SCREENING PERFORMED TODAY.  HISTORY OF MELANOMA IN SITU - No evidence of recurrence today - No lymphadenopathy - Recommend regular full body skin exams - Recommend daily broad spectrum sunscreen SPF 30+ to sun-exposed areas, reapply every 2 hours as needed.  - Call if any new or changing lesions are noted between office visits  - R mid back, 2011  HISTORY OF BASAL CELL CARCINOMA OF THE SKIN - No evidence of recurrence today - Recommend regular full body skin exams - Recommend daily broad spectrum sunscreen SPF 30+ to sun-exposed areas, reapply every 2 hours as needed.  - Call if any new or changing lesions are noted between office visits  - R prox lat calf near knee, L abdomen at costal area, chest, L anticubital, L prox antecubital, R low back  HISTORY OF SQUAMOUS CELL CARCINOMA IN SITU OF THE SKIN - No evidence of recurrence today - Recommend regular full body skin exams - Recommend daily broad spectrum sunscreen SPF 30+ to sun-exposed areas, reapply every 2 hours as needed.  - Call if any new or changing lesions are noted between office visits  -R tricep  HISTORY OF DYSPLASTIC NEVUS No evidence of recurrence today Recommend regular full body skin exams Recommend daily broad spectrum sunscreen SPF 30+ to sun-exposed areas, reapply every 2 hours as needed.  Call if any new or changing lesions  are noted between office visits  - R mid medial scapula, R upper back, R post medial mid thigh, R post waistline, R sup lat buttocks  Neoplasm of skin L scapula  Epidermal / dermal shaving  Lesion diameter (cm):  0.3 Informed consent: discussed and consent obtained   Timeout: patient name, date of birth, surgical site, and  procedure verified   Procedure prep:  Patient was prepped and draped in usual sterile fashion Prep type:  Isopropyl alcohol Anesthesia: the lesion was anesthetized in a standard fashion   Anesthetic:  1% lidocaine w/ epinephrine 1-100,000 buffered w/ 8.4% NaHCO3 Instrument used: flexible razor blade   Hemostasis achieved with: pressure, aluminum chloride and electrodesiccation   Outcome: patient tolerated procedure well   Post-procedure details: sterile dressing applied and wound care instructions given   Dressing type: bandage and petrolatum    Specimen 1 - Surgical pathology Differential Diagnosis: D48.5 Nevus vs Dysplastic Nevus  Check Margins: yes 0.3cm dark brown macule  Inflamed seborrheic keratosis R pretibial x 1  Symptomatic, irritating, patient would like treated.   Destruction of lesion - R pretibial x 1 Complexity: simple   Destruction method: cryotherapy   Informed consent: discussed and consent obtained   Timeout:  patient name, date of birth, surgical site, and procedure verified Lesion destroyed using liquid nitrogen: Yes   Region frozen until ice ball extended beyond lesion: Yes   Outcome: patient tolerated procedure well with no complications   Post-procedure details: wound care instructions given     Return in about 1 year (around 07/04/2023) for TBSE, Hx of Melanoma IS, Hx of BCC, Hx of Dysplastic nevi, Hx of SCC IS.  I, Sonya Hupman, RMA, am acting as scribe for Armida Sans, MD .   Documentation: I have reviewed the above documentation for accuracy and completeness, and I agree with the above.  Armida Sans, MD

## 2022-07-04 NOTE — Patient Instructions (Addendum)
Wound Care Instructions  Cleanse wound gently with soap and water once a day then pat dry with clean gauze. Apply a thin coat of Petrolatum (petroleum jelly, "Vaseline") over the wound (unless you have an allergy to this). We recommend that you use a new, sterile tube of Vaseline. Do not pick or remove scabs. Do not remove the yellow or white "healing tissue" from the base of the wound.  Cover the wound with fresh, clean, nonstick gauze and secure with paper tape. You may use Band-Aids in place of gauze and tape if the wound is small enough, but would recommend trimming much of the tape off as there is often too much. Sometimes Band-Aids can irritate the skin.  You should call the office for your biopsy report after 1 week if you have not already been contacted.  If you experience any problems, such as abnormal amounts of bleeding, swelling, significant bruising, significant pain, or evidence of infection, please call the office immediately.  FOR ADULT SURGERY PATIENTS: If you need something for pain relief you may take 1 extra strength Tylenol (acetaminophen) AND 2 Ibuprofen (200mg each) together every 4 hours as needed for pain. (do not take these if you are allergic to them or if you have a reason you should not take them.) Typically, you may only need pain medication for 1 to 3 days.     Due to recent changes in healthcare laws, you may see results of your pathology and/or laboratory studies on MyChart before the doctors have had a chance to review them. We understand that in some cases there may be results that are confusing or concerning to you. Please understand that not all results are received at the same time and often the doctors may need to interpret multiple results in order to provide you with the best plan of care or course of treatment. Therefore, we ask that you please give us 2 business days to thoroughly review all your results before contacting the office for clarification. Should  we see a critical lab result, you will be contacted sooner.   If You Need Anything After Your Visit  If you have any questions or concerns for your doctor, please call our main line at [phone] and press option 4 to reach your doctor's medical assistant. If no one answers, please leave a voicemail as directed and we will return your call as soon as possible. Messages left after 4 pm will be answered the following business day.   You may also send us a message via MyChart. We typically respond to MyChart messages within 1-2 business days.  For prescription refills, please ask your pharmacy to contact our office. Our fax number is [phone].  If you have an urgent issue when the clinic is closed that cannot wait until the next business day, you can page your doctor at the number below.    Please note that while we do our best to be available for urgent issues outside of office hours, we are not available 24/7.   If you have an urgent issue and are unable to reach us, you may choose to seek medical care at your doctor's office, retail clinic, urgent care center, or emergency room.  If you have a medical emergency, please immediately call 911 or go to the emergency department.  Pager Numbers  - Dr. Kowalski: [phone]  - Dr. Moye: [phone]  - Dr. Stewart: [phone]  In the event of inclement weather, please call our main line at   [phone] for an update on the status of any delays or closures.  Dermatology Medication Tips: Please keep the boxes that topical medications come in in order to help keep track of the instructions about where and how to use these. Pharmacies typically print the medication instructions only on the boxes and not directly on the medication tubes.   If your medication is too expensive, please contact our office at [phone] option 4 or send us a message through MyChart.   We are unable to tell what your co-pay for medications will be in  advance as this is different depending on your insurance coverage. However, we may be able to find a substitute medication at lower cost or fill out paperwork to get insurance to cover a needed medication.   If a prior authorization is required to get your medication covered by your insurance company, please allow us 1-2 business days to complete this process.  Drug prices often vary depending on where the prescription is filled and some pharmacies may offer cheaper prices.  The website www.goodrx.com contains coupons for medications through different pharmacies. The prices here do not account for what the cost may be with help from insurance (it may be cheaper with your insurance), but the website can give you the price if you did not use any insurance.  - You can print the associated coupon and take it with your prescription to the pharmacy.  - You may also stop by our office during regular business hours and pick up a GoodRx coupon card.  - If you need your prescription sent electronically to a different pharmacy, notify our office through [place] MyChart or by phone at [phone] option 4.     Si Usted Necesita Algo Despus de Su Visita  Tambin puede enviarnos un mensaje a travs de MyChart. Por lo general respondemos a los mensajes de MyChart en el transcurso de 1 a 2 das hbiles.  Para renovar recetas, por favor pida a su farmacia que se ponga en contacto con nuestra oficina. Nuestro nmero de fax es el [phone].  Si tiene un asunto urgente cuando la clnica est cerrada y que no puede esperar hasta el siguiente da hbil, puede llamar/localizar a su doctor(a) al nmero que aparece a continuacin.   Por favor, tenga en cuenta que aunque hacemos todo lo posible para estar disponibles para asuntos urgentes fuera del horario de oficina, no estamos disponibles las 24 horas del da, los 7 das de la semana.   Si tiene un problema urgente y no puede comunicarse con nosotros, puede  optar por buscar atencin mdica  en el consultorio de su doctor(a), en una clnica privada, en un centro de atencin urgente o en una sala de emergencias.  Si tiene una emergencia mdica, por favor llame inmediatamente al 911 o vaya a la sala de emergencias.  Nmeros de bper  - Dr. Kowalski: [phone]  - Dra. Moye: [phone]  - Dra. Stewart: [phone]  En caso de inclemencias del tiempo, por favor llame a nuestra lnea principal al [phone] para una actualizacin sobre el estado de cualquier retraso o cierre.  Consejos para la medicacin en dermatologa: Por favor, guarde las cajas en las que vienen los medicamentos de uso tpico para ayudarle a seguir las instrucciones sobre dnde y cmo usarlos. Las farmacias generalmente imprimen las instrucciones del medicamento slo en las cajas y no directamente en los tubos del medicamento.   Si su medicamento es muy caro, por favor, pngase en contacto con   nuestra oficina llamando al [phone] y presione la opcin 4 o envenos un mensaje a travs de MyChart.   No podemos decirle cul ser su copago por los medicamentos por adelantado ya que esto es diferente dependiendo de la cobertura de su seguro. Sin embargo, es posible que podamos encontrar un medicamento sustituto a menor costo o llenar un formulario para que el seguro cubra el medicamento que se considera necesario.   Si se requiere una autorizacin previa para que su compaa de seguros cubra su medicamento, por favor permtanos de 1 a 2 das hbiles para completar este proceso.  Los precios de los medicamentos varan con frecuencia dependiendo del lugar de dnde se surte la receta y alguna farmacias pueden ofrecer precios ms baratos.  El sitio web www.goodrx.com tiene cupones para medicamentos de diferentes farmacias. Los precios aqu no tienen en cuenta lo que podra costar con la ayuda del seguro (puede ser ms barato con su seguro), pero el sitio web puede darle el  precio si no utiliz ningn seguro.  - Puede imprimir el cupn correspondiente y llevarlo con su receta a la farmacia.  - Tambin puede pasar por nuestra oficina durante el horario de atencin regular y recoger una tarjeta de cupones de GoodRx.  - Si necesita que su receta se enve electrnicamente a una farmacia diferente, informe a nuestra oficina a travs de MyChart de [place] o por telfono llamando al [phone] y presione la opcin 4.  

## 2022-07-11 ENCOUNTER — Telehealth: Payer: Self-pay

## 2022-07-11 NOTE — Telephone Encounter (Signed)
Advised patient of results and scheduled 6 month follow up/hd 

## 2022-07-11 NOTE — Telephone Encounter (Signed)
-----   Message from Deirdre Evener, MD sent at 07/11/2022  5:08 PM EDT ----- Diagnosis Skin , L scapula DYSPLASTIC JUNCTIONAL NEVUS WITH SEVERE ATYPIA, MARGIN CLOSE, SEE DESCRIPTION  Severe dysplastic Margins free, but "margin close" May need additional treatment Recheck 6 mos.-- make pt appt for 6 mos

## 2022-07-13 ENCOUNTER — Encounter: Payer: Self-pay | Admitting: Dermatology

## 2022-07-26 ENCOUNTER — Telehealth: Payer: Self-pay | Admitting: Family Medicine

## 2022-07-26 DIAGNOSIS — E7841 Elevated Lipoprotein(a): Secondary | ICD-10-CM

## 2022-07-26 NOTE — Addendum Note (Signed)
Addended by: Alvina Chou on: 07/26/2022 02:24 PM   Modules accepted: Orders

## 2022-07-26 NOTE — Telephone Encounter (Signed)
-----   Message from Donnamarie Poag, [place] sent at 07/26/2022  8:15 AM EDT ----- Fasting lab order needed

## 2022-08-09 ENCOUNTER — Other Ambulatory Visit (INDEPENDENT_AMBULATORY_CARE_PROVIDER_SITE_OTHER): Payer: HMO

## 2022-08-09 DIAGNOSIS — E7841 Elevated Lipoprotein(a): Secondary | ICD-10-CM

## 2022-08-09 LAB — LIPID PANEL
Cholesterol: 247 mg/dL — ABNORMAL HIGH (ref 0–200)
HDL: 60.6 mg/dL (ref >39.00)
LDL Cholesterol: 157 mg/dL — ABNORMAL HIGH (ref 0–99)
NonHDL: 186.47
Total CHOL/HDL Ratio: 4
Triglycerides: 147 mg/dL (ref 0.0–149.0)
VLDL: 29.4 mg/dL (ref 0.0–40.0)

## 2022-08-12 MED ORDER — ROSUVASTATIN CALCIUM 10 MG PO TABS
10.0000 mg | ORAL_TABLET | Freq: Every day | ORAL | 3 refills | Status: DC
Start: 2022-08-12 — End: 2023-05-14

## 2022-08-12 NOTE — Addendum Note (Signed)
Addended by: Roxy Manns A on: 08/12/2022 09:46 AM   Modules accepted: Orders

## 2022-08-12 NOTE — Progress Notes (Signed)
I sent in crestor 10 mg  Take one daily in evening with a low fat snack  Alert me if any problems or side effects  (like stomach upset or muscle aches)  Check fasting labs in about 5 weeks

## 2022-08-12 NOTE — Assessment & Plan Note (Signed)
Lab Results  Component Value Date   LDLCALC 157 (H) 08/09/2022   Starting crestor 10 mg  Lab planned for 6 wk

## 2022-08-13 ENCOUNTER — Telehealth: Payer: Self-pay | Admitting: *Deleted

## 2022-08-13 NOTE — Telephone Encounter (Signed)
Patient scheduled.

## 2022-08-13 NOTE — Telephone Encounter (Signed)
Pt needs fasting lab work in 5 weeks (started new med pt aware)

## 2022-08-23 ENCOUNTER — Telehealth: Payer: Self-pay | Admitting: Family Medicine

## 2022-08-23 MED ORDER — LISINOPRIL 2.5 MG PO TABS: 2.5000 mg | ORAL_TABLET | Freq: Every day | ORAL | 2 refills | Status: DC

## 2022-08-23 NOTE — Telephone Encounter (Signed)
Patient called in stating that she has not been able to get in touch with he doctor at the [facility],they aren't responding to fax or messages,and she's out of her b/p medication lisinopril (ZESTRIL) 2.5 MG tablet . Even though the kidney center prescribed the medication to her ,the pharmacy told her to reach out to her pcp to see if she can send her in a refill ? She said that she has been without her medication for 2 days now.  [facility] - [place], [place] - [address] Phone: [phone]  Fax: [phone]

## 2022-08-27 DIAGNOSIS — Z1231 Encounter for screening mammogram for malignant neoplasm of breast: Secondary | ICD-10-CM | POA: Diagnosis not present

## 2022-08-27 DIAGNOSIS — Z01419 Encounter for gynecological examination (general) (routine) without abnormal findings: Secondary | ICD-10-CM | POA: Diagnosis not present

## 2022-08-27 DIAGNOSIS — Z6825 Body mass index (BMI) 25.0-25.9, adult: Secondary | ICD-10-CM | POA: Diagnosis not present

## 2022-08-27 LAB — HM MAMMOGRAPHY

## 2022-09-17 ENCOUNTER — Other Ambulatory Visit (INDEPENDENT_AMBULATORY_CARE_PROVIDER_SITE_OTHER): Payer: HMO

## 2022-09-17 DIAGNOSIS — E7841 Elevated Lipoprotein(a): Secondary | ICD-10-CM

## 2022-09-17 LAB — LIPID PANEL
Cholesterol: 175 mg/dL (ref 0–200)
HDL: 65.7 mg/dL (ref >39.00)
LDL Cholesterol: 86 mg/dL (ref 0–99)
NonHDL: 109.29
Total CHOL/HDL Ratio: 3
Triglycerides: 116 mg/dL (ref 0.0–149.0)
VLDL: 23.2 mg/dL (ref 0.0–40.0)

## 2022-09-17 LAB — ALT: ALT: 13 U/L (ref 0–35)

## 2022-09-17 LAB — AST: AST: 21 U/L (ref 0–37)

## 2022-09-18 ENCOUNTER — Encounter: Payer: Self-pay | Admitting: *Deleted

## 2022-09-21 DIAGNOSIS — M62830 Muscle spasm of back: Secondary | ICD-10-CM | POA: Diagnosis not present

## 2022-09-21 DIAGNOSIS — M545 Low back pain, unspecified: Secondary | ICD-10-CM | POA: Diagnosis not present

## 2022-09-21 DIAGNOSIS — M9903 Segmental and somatic dysfunction of lumbar region: Secondary | ICD-10-CM | POA: Diagnosis not present

## 2022-09-21 DIAGNOSIS — M9904 Segmental and somatic dysfunction of sacral region: Secondary | ICD-10-CM | POA: Diagnosis not present

## 2022-09-24 DIAGNOSIS — M545 Low back pain, unspecified: Secondary | ICD-10-CM | POA: Diagnosis not present

## 2022-09-24 DIAGNOSIS — M9904 Segmental and somatic dysfunction of sacral region: Secondary | ICD-10-CM | POA: Diagnosis not present

## 2022-09-24 DIAGNOSIS — M62830 Muscle spasm of back: Secondary | ICD-10-CM | POA: Diagnosis not present

## 2022-09-24 DIAGNOSIS — M9903 Segmental and somatic dysfunction of lumbar region: Secondary | ICD-10-CM | POA: Diagnosis not present

## 2022-09-26 DIAGNOSIS — M9904 Segmental and somatic dysfunction of sacral region: Secondary | ICD-10-CM | POA: Diagnosis not present

## 2022-09-26 DIAGNOSIS — M62830 Muscle spasm of back: Secondary | ICD-10-CM | POA: Diagnosis not present

## 2022-09-26 DIAGNOSIS — M9903 Segmental and somatic dysfunction of lumbar region: Secondary | ICD-10-CM | POA: Diagnosis not present

## 2022-09-26 DIAGNOSIS — M545 Low back pain, unspecified: Secondary | ICD-10-CM | POA: Diagnosis not present

## 2022-09-28 DIAGNOSIS — M9904 Segmental and somatic dysfunction of sacral region: Secondary | ICD-10-CM | POA: Diagnosis not present

## 2022-09-28 DIAGNOSIS — M9903 Segmental and somatic dysfunction of lumbar region: Secondary | ICD-10-CM | POA: Diagnosis not present

## 2022-09-28 DIAGNOSIS — M62830 Muscle spasm of back: Secondary | ICD-10-CM | POA: Diagnosis not present

## 2022-09-28 DIAGNOSIS — M545 Low back pain, unspecified: Secondary | ICD-10-CM | POA: Diagnosis not present

## 2022-10-01 DIAGNOSIS — M9903 Segmental and somatic dysfunction of lumbar region: Secondary | ICD-10-CM | POA: Diagnosis not present

## 2022-10-01 DIAGNOSIS — M9904 Segmental and somatic dysfunction of sacral region: Secondary | ICD-10-CM | POA: Diagnosis not present

## 2022-10-01 DIAGNOSIS — M545 Low back pain, unspecified: Secondary | ICD-10-CM | POA: Diagnosis not present

## 2022-10-01 DIAGNOSIS — M62830 Muscle spasm of back: Secondary | ICD-10-CM | POA: Diagnosis not present

## 2022-10-03 DIAGNOSIS — M62830 Muscle spasm of back: Secondary | ICD-10-CM | POA: Diagnosis not present

## 2022-10-03 DIAGNOSIS — M9904 Segmental and somatic dysfunction of sacral region: Secondary | ICD-10-CM | POA: Diagnosis not present

## 2022-10-03 DIAGNOSIS — M545 Low back pain, unspecified: Secondary | ICD-10-CM | POA: Diagnosis not present

## 2022-10-03 DIAGNOSIS — M9903 Segmental and somatic dysfunction of lumbar region: Secondary | ICD-10-CM | POA: Diagnosis not present

## 2022-10-05 DIAGNOSIS — M9904 Segmental and somatic dysfunction of sacral region: Secondary | ICD-10-CM | POA: Diagnosis not present

## 2022-10-05 DIAGNOSIS — M545 Low back pain, unspecified: Secondary | ICD-10-CM | POA: Diagnosis not present

## 2022-10-05 DIAGNOSIS — M9903 Segmental and somatic dysfunction of lumbar region: Secondary | ICD-10-CM | POA: Diagnosis not present

## 2022-10-05 DIAGNOSIS — M62830 Muscle spasm of back: Secondary | ICD-10-CM | POA: Diagnosis not present

## 2022-10-08 DIAGNOSIS — M62830 Muscle spasm of back: Secondary | ICD-10-CM | POA: Diagnosis not present

## 2022-10-08 DIAGNOSIS — M9904 Segmental and somatic dysfunction of sacral region: Secondary | ICD-10-CM | POA: Diagnosis not present

## 2022-10-08 DIAGNOSIS — M545 Low back pain, unspecified: Secondary | ICD-10-CM | POA: Diagnosis not present

## 2022-10-08 DIAGNOSIS — M9903 Segmental and somatic dysfunction of lumbar region: Secondary | ICD-10-CM | POA: Diagnosis not present

## 2022-10-10 DIAGNOSIS — M62830 Muscle spasm of back: Secondary | ICD-10-CM | POA: Diagnosis not present

## 2022-10-10 DIAGNOSIS — M9903 Segmental and somatic dysfunction of lumbar region: Secondary | ICD-10-CM | POA: Diagnosis not present

## 2022-10-10 DIAGNOSIS — M545 Low back pain, unspecified: Secondary | ICD-10-CM | POA: Diagnosis not present

## 2022-10-10 DIAGNOSIS — M9904 Segmental and somatic dysfunction of sacral region: Secondary | ICD-10-CM | POA: Diagnosis not present

## 2022-10-17 DIAGNOSIS — M545 Low back pain, unspecified: Secondary | ICD-10-CM | POA: Diagnosis not present

## 2022-10-17 DIAGNOSIS — M9904 Segmental and somatic dysfunction of sacral region: Secondary | ICD-10-CM | POA: Diagnosis not present

## 2022-10-17 DIAGNOSIS — M9903 Segmental and somatic dysfunction of lumbar region: Secondary | ICD-10-CM | POA: Diagnosis not present

## 2022-10-17 DIAGNOSIS — M62830 Muscle spasm of back: Secondary | ICD-10-CM | POA: Diagnosis not present

## 2022-10-19 DIAGNOSIS — M545 Low back pain, unspecified: Secondary | ICD-10-CM | POA: Diagnosis not present

## 2022-10-19 DIAGNOSIS — M62830 Muscle spasm of back: Secondary | ICD-10-CM | POA: Diagnosis not present

## 2022-10-19 DIAGNOSIS — M9903 Segmental and somatic dysfunction of lumbar region: Secondary | ICD-10-CM | POA: Diagnosis not present

## 2022-10-19 DIAGNOSIS — M9904 Segmental and somatic dysfunction of sacral region: Secondary | ICD-10-CM | POA: Diagnosis not present

## 2022-10-23 DIAGNOSIS — M9904 Segmental and somatic dysfunction of sacral region: Secondary | ICD-10-CM | POA: Diagnosis not present

## 2022-10-23 DIAGNOSIS — M9903 Segmental and somatic dysfunction of lumbar region: Secondary | ICD-10-CM | POA: Diagnosis not present

## 2022-10-23 DIAGNOSIS — M545 Low back pain, unspecified: Secondary | ICD-10-CM | POA: Diagnosis not present

## 2022-10-23 DIAGNOSIS — M62830 Muscle spasm of back: Secondary | ICD-10-CM | POA: Diagnosis not present

## 2022-10-30 DIAGNOSIS — M9903 Segmental and somatic dysfunction of lumbar region: Secondary | ICD-10-CM | POA: Diagnosis not present

## 2022-10-30 DIAGNOSIS — M9904 Segmental and somatic dysfunction of sacral region: Secondary | ICD-10-CM | POA: Diagnosis not present

## 2022-10-30 DIAGNOSIS — M62830 Muscle spasm of back: Secondary | ICD-10-CM | POA: Diagnosis not present

## 2022-10-30 DIAGNOSIS — M545 Low back pain, unspecified: Secondary | ICD-10-CM | POA: Diagnosis not present

## 2022-11-13 DIAGNOSIS — M62830 Muscle spasm of back: Secondary | ICD-10-CM | POA: Diagnosis not present

## 2022-11-13 DIAGNOSIS — M9903 Segmental and somatic dysfunction of lumbar region: Secondary | ICD-10-CM | POA: Diagnosis not present

## 2022-11-13 DIAGNOSIS — M9904 Segmental and somatic dysfunction of sacral region: Secondary | ICD-10-CM | POA: Diagnosis not present

## 2022-11-13 DIAGNOSIS — M545 Low back pain, unspecified: Secondary | ICD-10-CM | POA: Diagnosis not present

## 2022-12-04 DIAGNOSIS — M9904 Segmental and somatic dysfunction of sacral region: Secondary | ICD-10-CM | POA: Diagnosis not present

## 2022-12-04 DIAGNOSIS — M62830 Muscle spasm of back: Secondary | ICD-10-CM | POA: Diagnosis not present

## 2022-12-04 DIAGNOSIS — M9903 Segmental and somatic dysfunction of lumbar region: Secondary | ICD-10-CM | POA: Diagnosis not present

## 2022-12-04 DIAGNOSIS — M545 Low back pain, unspecified: Secondary | ICD-10-CM | POA: Diagnosis not present

## 2023-01-02 ENCOUNTER — Ambulatory Visit (INDEPENDENT_AMBULATORY_CARE_PROVIDER_SITE_OTHER): Payer: HMO | Admitting: Dermatology

## 2023-01-02 DIAGNOSIS — Z86018 Personal history of other benign neoplasm: Secondary | ICD-10-CM

## 2023-01-02 DIAGNOSIS — M9904 Segmental and somatic dysfunction of sacral region: Secondary | ICD-10-CM | POA: Diagnosis not present

## 2023-01-02 DIAGNOSIS — M62830 Muscle spasm of back: Secondary | ICD-10-CM | POA: Diagnosis not present

## 2023-01-02 DIAGNOSIS — L578 Other skin changes due to chronic exposure to nonionizing radiation: Secondary | ICD-10-CM | POA: Diagnosis not present

## 2023-01-02 DIAGNOSIS — M545 Low back pain, unspecified: Secondary | ICD-10-CM | POA: Diagnosis not present

## 2023-01-02 DIAGNOSIS — M9903 Segmental and somatic dysfunction of lumbar region: Secondary | ICD-10-CM | POA: Diagnosis not present

## 2023-01-02 NOTE — Progress Notes (Signed)
   Follow-Up Visit   Subjective  Terri Espinoza is a 67 y.o. female who presents for the following: Recheck severely dysplastic nevus of the L scapula.   The following portions of the chart were reviewed this encounter and updated as appropriate: medications, allergies, medical history  Review of Systems:  No other skin or systemic complaints except as noted in HPI or Assessment and Plan.  Objective  Well appearing patient in no apparent distress; mood and affect are within normal limits.   A focused examination was performed of the following areas: the face and back   Relevant exam findings are noted in the Assessment and Plan.    Assessment & Plan   HISTORY OF DYSPLASTIC NEVUS - L scapula, severe but margins free No evidence of recurrence today Recommend regular full body skin exams Recommend daily broad spectrum sunscreen SPF 30+ to sun-exposed areas, reapply every 2 hours as needed.  Call if any new or changing lesions are noted between office visits  ACTINIC DAMAGE - chronic, secondary to cumulative UV radiation exposure/sun exposure over time - diffuse scaly erythematous macules with underlying dyspigmentation - Recommend daily broad spectrum sunscreen SPF 30+ to sun-exposed areas, reapply every 2 hours as needed.  - Recommend staying in the shade or wearing long sleeves, sun glasses (UVA+UVB protection) and wide brim hats (4-inch brim around the entire circumference of the hat). - Call for new or changing lesions.   Return for appointment as scheduled.  Maylene Roes, CMA, am acting as scribe for Armida Sans, MD .   Documentation: I have reviewed the above documentation for accuracy and completeness, and I agree with the above.  Armida Sans, MD

## 2023-01-02 NOTE — Patient Instructions (Signed)

## 2023-01-06 ENCOUNTER — Encounter: Payer: Self-pay | Admitting: Dermatology

## 2023-01-23 DIAGNOSIS — I10 Essential (primary) hypertension: Secondary | ICD-10-CM | POA: Diagnosis not present

## 2023-01-23 DIAGNOSIS — R809 Proteinuria, unspecified: Secondary | ICD-10-CM | POA: Diagnosis not present

## 2023-01-23 DIAGNOSIS — D649 Anemia, unspecified: Secondary | ICD-10-CM | POA: Diagnosis not present

## 2023-01-23 DIAGNOSIS — N1831 Chronic kidney disease, stage 3a: Secondary | ICD-10-CM | POA: Diagnosis not present

## 2023-01-23 DIAGNOSIS — E785 Hyperlipidemia, unspecified: Secondary | ICD-10-CM | POA: Diagnosis not present

## 2023-01-30 DIAGNOSIS — M9904 Segmental and somatic dysfunction of sacral region: Secondary | ICD-10-CM | POA: Diagnosis not present

## 2023-01-30 DIAGNOSIS — M62830 Muscle spasm of back: Secondary | ICD-10-CM | POA: Diagnosis not present

## 2023-01-30 DIAGNOSIS — M9903 Segmental and somatic dysfunction of lumbar region: Secondary | ICD-10-CM | POA: Diagnosis not present

## 2023-01-30 DIAGNOSIS — M545 Low back pain, unspecified: Secondary | ICD-10-CM | POA: Diagnosis not present

## 2023-02-18 DIAGNOSIS — H524 Presbyopia: Secondary | ICD-10-CM | POA: Diagnosis not present

## 2023-02-18 DIAGNOSIS — H04123 Dry eye syndrome of bilateral lacrimal glands: Secondary | ICD-10-CM | POA: Diagnosis not present

## 2023-02-18 DIAGNOSIS — H5203 Hypermetropia, bilateral: Secondary | ICD-10-CM | POA: Diagnosis not present

## 2023-02-18 DIAGNOSIS — H52223 Regular astigmatism, bilateral: Secondary | ICD-10-CM | POA: Diagnosis not present

## 2023-02-26 ENCOUNTER — Other Ambulatory Visit: Payer: Self-pay | Admitting: Family Medicine

## 2023-02-27 DIAGNOSIS — M9903 Segmental and somatic dysfunction of lumbar region: Secondary | ICD-10-CM | POA: Diagnosis not present

## 2023-02-27 DIAGNOSIS — M9904 Segmental and somatic dysfunction of sacral region: Secondary | ICD-10-CM | POA: Diagnosis not present

## 2023-02-27 DIAGNOSIS — M62830 Muscle spasm of back: Secondary | ICD-10-CM | POA: Diagnosis not present

## 2023-02-27 DIAGNOSIS — M545 Low back pain, unspecified: Secondary | ICD-10-CM | POA: Diagnosis not present

## 2023-03-25 ENCOUNTER — Ambulatory Visit: Payer: Self-pay | Admitting: Family Medicine

## 2023-03-25 ENCOUNTER — Ambulatory Visit (INDEPENDENT_AMBULATORY_CARE_PROVIDER_SITE_OTHER): Payer: HMO | Admitting: Internal Medicine

## 2023-03-25 VITALS — BP 120/74 | HR 73 | Temp 98.5°F | Ht <= 58 in | Wt 128.0 lb

## 2023-03-25 DIAGNOSIS — J0140 Acute pansinusitis, unspecified: Secondary | ICD-10-CM | POA: Insufficient documentation

## 2023-03-25 DIAGNOSIS — J019 Acute sinusitis, unspecified: Secondary | ICD-10-CM | POA: Insufficient documentation

## 2023-03-25 MED ORDER — AMOXICILLIN-POT CLAVULANATE 875-125 MG PO TABS
1.0000 | ORAL_TABLET | Freq: Two times a day (BID) | ORAL | 0 refills | Status: DC
Start: 2023-03-25 — End: 2023-05-27

## 2023-03-25 NOTE — Telephone Encounter (Signed)
 Okay---I will assess her at the OV today

## 2023-03-25 NOTE — Progress Notes (Signed)
 Subjective:    Patient ID: Terri Espinoza, female    DOB: [birth year], 68 y.o.   MRN: [id number]  HPI Here due to persistent sinus symptoms  Sinus symptoms for about 4 weeks Runny nose, feeling of fluid in her ears Clear nasal drainage---now yellow with some blood Does take allergy meds regularly No clear fever---high was 99.5 Some sweat last night though Feeling of mucus in throat No sore throat No headache  Taking mucinex, tylenol--may help some  Current Outpatient Medications on File Prior to Visit  Medication Sig Dispense Refill   Calcium -Magnesium-Vitamin D  (CALCIUM  500 PO) Take by mouth.     cetirizine (ZYRTEC) 10 MG chewable tablet Chew 10 mg by mouth daily.     cholecalciferol (VITAMIN D ) 1000 UNITS tablet Take 5,000 Units by mouth daily.     estradiol (ESTRACE) 1 MG tablet Take 1 mg by mouth daily.     fluticasone  (FLONASE ) 50 MCG/ACT nasal spray Place 2 sprays into both nostrils daily. 16 g 6   lisinopril  (ZESTRIL ) 2.5 MG tablet TAKE 1 TABLET BY MOUTH DAILY 90 tablet 0   loratadine (CLARITIN) 5 MG chewable tablet Chew 5 mg by mouth daily.     Magnesium 100 MG CAPS Take by mouth.     Multiple Vitamin (MULTIVITAMIN) tablet Take 1 tablet by mouth daily.     Multiple Vitamins-Minerals (EMERGEN-C BLUE PO) Take by mouth.     Multiple Vitamins-Minerals (ZINC PO) Take by mouth.     rosuvastatin  (CRESTOR ) 10 MG tablet Take 1 tablet (10 mg total) by mouth daily. 90 tablet 3   No current facility-administered medications on file prior to visit.    No Known Allergies  Past Medical History:  Diagnosis Date   Allergic rhinitis    Allergy    Anal fissure    Anxiety    Arthritis    Basal cell carcinoma 06/22/2020   Right prox lat calf near knee - [facility] 07/05/20   Basal cell carcinoma (BCC) 11/16/2021   left abdomen at costal area - ED&C   Cancer (HCC)    melanoma on back and upper abdomen  BCC also multiple   Dysplastic nevus 09/15/2019   R mid medial scapula,  moderate to severe, shave removal   Dysplastic nevus 07/04/2022   Left scapula - severe - recheck on follow up 01/02/2023   Hemorrhoids    HLD (hyperlipidemia)    HTN (hypertension)    Hx of basal cell carcinoma 01/11/2006   chest   Hx of basal cell carcinoma 12/08/2009   L anticubital   Hx of basal cell carcinoma 12/29/2013   L proximal antecubital   Hx of basal cell carcinoma 12/23/2014   R low back   Hx of dysplastic nevus 01/24/2007   R upper back, moderate atypia   Hx of dysplastic nevus 06/22/2014   R post medial mid thigh   Hx of dysplastic nevus 12/06/2014   R post waistline, mild atypia   Hx of dysplastic nevus 12/06/2014   R sup lat buttocks, mild atypia   Hypothyroidism    pt. denies   Melanoma (HCC) 12/29/2009   R mid back, Melanoma IS   Osteopenia    Squamous cell carcinoma of skin 11/16/2021   SCCIS R tricep - ED&C 01/30/22   Uterine fibroid     Past Surgical History:  Procedure Laterality Date   ABDOMINAL HYSTERECTOMY  1995   partial, fibroids, Dr. Andree Kayser   COLONOSCOPY  2009   FLEXIBLE SIGMOIDOSCOPY  2002   HEMORRHOID BANDING     skin cancer removal     melanoma upper abdomen/back   BCC multiple    URETHRAL DILATION  1988   Dr. Dariel Edelson    Family History  Problem Relation Age of Onset   Hypertension Mother    Hyperlipidemia Mother    Alcohol abuse Father    Cirrhosis Father    Colon polyps Father    Colon polyps Sister    Hypertension Sister    Hypertension Maternal Aunt    Lung cancer Paternal Aunt    Colon cancer Neg Hx    Esophageal cancer Neg Hx    Stomach cancer Neg Hx    Rectal cancer Neg Hx     Social History   Socioeconomic History   Marital status: Divorced    Spouse name: Not on file   Number of children: 0   Years of education: Not on file   Highest education level: Some college, no degree  Occupational History   Occupation: Conservator, museum/gallery, Neurosurgeon: [employer]  Tobacco Use   Smoking status: Former    Smokeless tobacco: Never   Tobacco comments:    quit 25 years ago  Vaping Use   Vaping status: Never Used  Substance and Sexual Activity   Alcohol use: No   Drug use: No   Sexual activity: Not on file  Other Topics Concern   Not on file  Social History Narrative   Divorced, Conservator, museum/gallery assistance. One caffeinated beverage daily. No children.   Lives alone, next door to her mother. Has a dog. Retiring 2019, will continue to work part time.    Social Drivers of Corporate investment banker Strain: Low Risk  (03/25/2023)   Overall Financial Resource Strain (CARDIA)    Difficulty of Paying Living Expenses: Not hard at all  Food Insecurity: No Food Insecurity (03/25/2023)   Hunger Vital Sign    Worried About Running Out of Food in the Last Year: Never true    Ran Out of Food in the Last Year: Never true  Transportation Needs: No Transportation Needs (03/25/2023)   PRAPARE - Administrator, Civil Service (Medical): No    Lack of Transportation (Non-Medical): No  Physical Activity: Sufficiently Active (03/25/2023)   Exercise Vital Sign    Days of Exercise per Week: 5 days    Minutes of Exercise per Session: 60 min  Stress: No Stress Concern Present (03/25/2023)   [employer] of Occupational Health - Occupational Stress Questionnaire    Feeling of Stress : Not at all  Social Connections: Moderately Integrated (03/25/2023)   Social Connection and Isolation Panel [NHANES]    Frequency of Communication with Friends and Family: More than three times a week    Frequency of Social Gatherings with Friends and Family: Three times a week    Attends Religious Services: 1 to 4 times per year    Active Member of Clubs or Organizations: No    Attends Engineer, structural: More than 4 times per year    Marital Status: Divorced  Intimate Partner Violence: Not At Risk (05/09/2022)   Humiliation, Afraid, Rape, and Kick questionnaire    Fear of Current or Ex-Partner: No     Emotionally Abused: No    Physically Abused: No    Sexually Abused: No   Review of Systems No change in taste--smell is not right No N/V Able to eat okay Does have a humidifier at  home--gas heat    Objective:   Physical Exam Constitutional:      Appearance: Normal appearance.  HENT:     Head:     Comments: No sinus tenderness    Right Ear: Tympanic membrane and ear canal normal.     Left Ear: Tympanic membrane and ear canal normal.     Mouth/Throat:     Pharynx: No oropharyngeal exudate or posterior oropharyngeal erythema.  Pulmonary:     Effort: Pulmonary effort is normal.     Breath sounds: Normal breath sounds. No wheezing or rales.  Musculoskeletal:     Cervical back: Neck supple.  Lymphadenopathy:     Cervical: No cervical adenopathy.  Neurological:     Mental Status: She is alert.            Assessment & Plan:

## 2023-03-25 NOTE — Assessment & Plan Note (Signed)
 Has been going on for about a month Clearly seems to have infection component Continue tylenol--supportive care/fluticasone  Augmentin  875 bid x 7 days

## 2023-03-25 NOTE — Telephone Encounter (Signed)
  Chief Complaint: sinus pain Symptoms: sinus pain, congestion, cough, ear fullness Frequency: 4 weeks Pertinent Negatives: Patient denies SOB, CP, fever Disposition: [] ED /[] Urgent Care (no appt availability in office) / [x] Appointment(In office/virtual)/ []  [facility]/ [] Home Care/ [] Refused Recommended Disposition /[] [facility]/ []  Follow-up with PCP Additional Notes: Patient calls reporting worsening sinus pain/pressure, cough, congestion, ear fullness x 4 weeks. States she has taken OTC medications such as mucinex and tylenol, used nasal rinses with minimal relief. Per protocol, patient to be evaluated within 24 hours. First available appointment with PCP outside of protocol. Patient scheduled with first available provider in clinic for today at 1400. Care advice reviewed, patient verbalized understanding and denies further questions at this time. Alerting PCP for review.    Copied from CRM [id number]. Topic: Clinical - Red Word Triage >> Mar 25, 2023  9:05 AM Erling Hawthorne B wrote: Reason for Triage: Pt stated that she has been sick for 4 weeks, and stated that she has alot of drainage, discoloration in her mucus along with blood. Pt thinks she has a sinus infection. Reason for Disposition  Earache  Answer Assessment - Initial Assessment Questions 1. LOCATION: "Where does it hurt?"      Pressure in face around nose and eyes. Has taken mucinex and tylenol with minimal relief. 2. ONSET: "When did the sinus pain start?"  (e.g., hours, days)      4 weeks 3. SEVERITY: "How bad is the pain?"   (Scale 1-10; mild, moderate or severe)   - MILD (1-3): doesn't interfere with normal activities    - MODERATE (4-7): interferes with normal activities (e.g., work or school) or awakens from sleep   - SEVERE (8-10): excruciating pain and patient unable to do any normal activities        Denies pain at this moment. 4. RECURRENT SYMPTOM: "Have you ever had sinus problems before?" If Yes,  ask: "When was the last time?" and "What happened that time?"      Yes, sinus infections in the past approx 1 year ago. 5. NASAL CONGESTION: "Is the nose blocked?" If Yes, ask: "Can you open it or must you breathe through your mouth?"     Denies 6. NASAL DISCHARGE: "Do you have discharge from your nose?" If so ask, "What color?"     Bright yellow 7. FEVER: "Do you have a fever?" If Yes, ask: "What is it, how was it measured, and when did it start?"      Denies 8. OTHER SYMPTOMS: "Do you have any other symptoms?" (e.g., sore throat, cough, earache, difficulty breathing)     Ear fullness, feels like there is water in her ears, cough  Protocols used: Sinus Pain or Congestion-A-AH

## 2023-03-27 DIAGNOSIS — M9903 Segmental and somatic dysfunction of lumbar region: Secondary | ICD-10-CM | POA: Diagnosis not present

## 2023-03-27 DIAGNOSIS — M62830 Muscle spasm of back: Secondary | ICD-10-CM | POA: Diagnosis not present

## 2023-03-27 DIAGNOSIS — M9904 Segmental and somatic dysfunction of sacral region: Secondary | ICD-10-CM | POA: Diagnosis not present

## 2023-03-27 DIAGNOSIS — M545 Low back pain, unspecified: Secondary | ICD-10-CM | POA: Diagnosis not present

## 2023-04-24 DIAGNOSIS — M62830 Muscle spasm of back: Secondary | ICD-10-CM | POA: Diagnosis not present

## 2023-04-24 DIAGNOSIS — M545 Low back pain, unspecified: Secondary | ICD-10-CM | POA: Diagnosis not present

## 2023-04-24 DIAGNOSIS — M9904 Segmental and somatic dysfunction of sacral region: Secondary | ICD-10-CM | POA: Diagnosis not present

## 2023-04-24 DIAGNOSIS — M9903 Segmental and somatic dysfunction of lumbar region: Secondary | ICD-10-CM | POA: Diagnosis not present

## 2023-04-29 DIAGNOSIS — M62830 Muscle spasm of back: Secondary | ICD-10-CM | POA: Diagnosis not present

## 2023-04-29 DIAGNOSIS — M9903 Segmental and somatic dysfunction of lumbar region: Secondary | ICD-10-CM | POA: Diagnosis not present

## 2023-04-29 DIAGNOSIS — M545 Low back pain, unspecified: Secondary | ICD-10-CM | POA: Diagnosis not present

## 2023-04-29 DIAGNOSIS — M9904 Segmental and somatic dysfunction of sacral region: Secondary | ICD-10-CM | POA: Diagnosis not present

## 2023-05-01 DIAGNOSIS — M9903 Segmental and somatic dysfunction of lumbar region: Secondary | ICD-10-CM | POA: Diagnosis not present

## 2023-05-01 DIAGNOSIS — M9904 Segmental and somatic dysfunction of sacral region: Secondary | ICD-10-CM | POA: Diagnosis not present

## 2023-05-01 DIAGNOSIS — M62830 Muscle spasm of back: Secondary | ICD-10-CM | POA: Diagnosis not present

## 2023-05-01 DIAGNOSIS — M545 Low back pain, unspecified: Secondary | ICD-10-CM | POA: Diagnosis not present

## 2023-05-06 DIAGNOSIS — M9903 Segmental and somatic dysfunction of lumbar region: Secondary | ICD-10-CM | POA: Diagnosis not present

## 2023-05-06 DIAGNOSIS — M62830 Muscle spasm of back: Secondary | ICD-10-CM | POA: Diagnosis not present

## 2023-05-06 DIAGNOSIS — M9904 Segmental and somatic dysfunction of sacral region: Secondary | ICD-10-CM | POA: Diagnosis not present

## 2023-05-06 DIAGNOSIS — M545 Low back pain, unspecified: Secondary | ICD-10-CM | POA: Diagnosis not present

## 2023-05-08 DIAGNOSIS — M545 Low back pain, unspecified: Secondary | ICD-10-CM | POA: Diagnosis not present

## 2023-05-08 DIAGNOSIS — M9903 Segmental and somatic dysfunction of lumbar region: Secondary | ICD-10-CM | POA: Diagnosis not present

## 2023-05-08 DIAGNOSIS — M9904 Segmental and somatic dysfunction of sacral region: Secondary | ICD-10-CM | POA: Diagnosis not present

## 2023-05-08 DIAGNOSIS — M62830 Muscle spasm of back: Secondary | ICD-10-CM | POA: Diagnosis not present

## 2023-05-13 DIAGNOSIS — M545 Low back pain, unspecified: Secondary | ICD-10-CM | POA: Diagnosis not present

## 2023-05-13 DIAGNOSIS — M62830 Muscle spasm of back: Secondary | ICD-10-CM | POA: Diagnosis not present

## 2023-05-13 DIAGNOSIS — M9904 Segmental and somatic dysfunction of sacral region: Secondary | ICD-10-CM | POA: Diagnosis not present

## 2023-05-13 DIAGNOSIS — M9903 Segmental and somatic dysfunction of lumbar region: Secondary | ICD-10-CM | POA: Diagnosis not present

## 2023-05-14 ENCOUNTER — Other Ambulatory Visit: Payer: Self-pay | Admitting: Family Medicine

## 2023-05-14 NOTE — Telephone Encounter (Signed)
 Spoke to pt, scheduled cpe for 06/06/23

## 2023-05-14 NOTE — Telephone Encounter (Signed)
 Pt is due for her CPE (labs prior), please schedule, I will refill med once

## 2023-05-21 DIAGNOSIS — M9903 Segmental and somatic dysfunction of lumbar region: Secondary | ICD-10-CM | POA: Diagnosis not present

## 2023-05-21 DIAGNOSIS — M62830 Muscle spasm of back: Secondary | ICD-10-CM | POA: Diagnosis not present

## 2023-05-21 DIAGNOSIS — M545 Low back pain, unspecified: Secondary | ICD-10-CM | POA: Diagnosis not present

## 2023-05-21 DIAGNOSIS — M9904 Segmental and somatic dysfunction of sacral region: Secondary | ICD-10-CM | POA: Diagnosis not present

## 2023-05-27 ENCOUNTER — Telehealth: Payer: Self-pay | Admitting: Family Medicine

## 2023-05-27 DIAGNOSIS — I10 Essential (primary) hypertension: Secondary | ICD-10-CM

## 2023-05-27 DIAGNOSIS — E038 Other specified hypothyroidism: Secondary | ICD-10-CM

## 2023-05-27 DIAGNOSIS — E7841 Elevated Lipoprotein(a): Secondary | ICD-10-CM

## 2023-05-27 NOTE — Telephone Encounter (Signed)
-----   Message from Gerry Krone sent at 05/15/2023  3:02 PM EDT ----- Regarding: Lab orders for Thursday, 4.17.25 Patient is scheduled for CPX labs, please order future labs, Thanks , Anselmo Kings

## 2023-05-30 ENCOUNTER — Other Ambulatory Visit (INDEPENDENT_AMBULATORY_CARE_PROVIDER_SITE_OTHER)

## 2023-05-30 DIAGNOSIS — E038 Other specified hypothyroidism: Secondary | ICD-10-CM

## 2023-05-30 DIAGNOSIS — E7841 Elevated Lipoprotein(a): Secondary | ICD-10-CM

## 2023-05-30 DIAGNOSIS — I10 Essential (primary) hypertension: Secondary | ICD-10-CM

## 2023-05-30 LAB — CBC WITH DIFFERENTIAL/PLATELET
Basophils Absolute: 0.1 10*3/uL (ref 0.0–0.1)
Basophils Relative: 1.3 % (ref 0.0–3.0)
Eosinophils Absolute: 0.1 10*3/uL (ref 0.0–0.7)
Eosinophils Relative: 1.8 % (ref 0.0–5.0)
HCT: 38 % (ref 36.0–46.0)
Hemoglobin: 12.7 g/dL (ref 12.0–15.0)
Lymphocytes Relative: 18.9 % (ref 12.0–46.0)
Lymphs Abs: 1.1 10*3/uL (ref 0.7–4.0)
MCHC: 33.5 g/dL (ref 30.0–36.0)
MCV: 95.2 fl (ref 78.0–100.0)
Monocytes Absolute: 0.7 10*3/uL (ref 0.1–1.0)
Monocytes Relative: 12 % (ref 3.0–12.0)
Neutro Abs: 3.9 10*3/uL (ref 1.4–7.7)
Neutrophils Relative %: 66 % (ref 43.0–77.0)
Platelets: 154 10*3/uL (ref 150.0–400.0)
RBC: 3.99 Mil/uL (ref 3.87–5.11)
RDW: 14.1 % (ref 11.5–15.5)
WBC: 5.9 10*3/uL (ref 4.0–10.5)

## 2023-05-30 LAB — COMPREHENSIVE METABOLIC PANEL WITH GFR
ALT: 18 U/L (ref 0–35)
AST: 29 U/L (ref 0–37)
Albumin: 4.8 g/dL (ref 3.5–5.2)
Alkaline Phosphatase: 53 U/L (ref 39–117)
BUN: 24 mg/dL — ABNORMAL HIGH (ref 6–23)
CO2: 30 meq/L (ref 19–32)
Calcium: 9.1 mg/dL (ref 8.4–10.5)
Chloride: 102 meq/L (ref 96–112)
Creatinine, Ser: 1.25 mg/dL — ABNORMAL HIGH (ref 0.40–1.20)
GFR: 44.5 mL/min — ABNORMAL LOW (ref >60.00)
Glucose, Bld: 80 mg/dL (ref 70–99)
Potassium: 4.4 meq/L (ref 3.5–5.1)
Sodium: 139 meq/L (ref 135–145)
Total Bilirubin: 0.6 mg/dL (ref 0.2–1.2)
Total Protein: 7 g/dL (ref 6.0–8.3)

## 2023-05-30 LAB — LIPID PANEL
Cholesterol: 204 mg/dL — ABNORMAL HIGH (ref 0–200)
HDL: 79.3 mg/dL (ref >39.00)
LDL Cholesterol: 110 mg/dL — ABNORMAL HIGH (ref 0–99)
NonHDL: 124.78
Total CHOL/HDL Ratio: 3
Triglycerides: 76 mg/dL (ref 0.0–149.0)
VLDL: 15.2 mg/dL (ref 0.0–40.0)

## 2023-05-30 LAB — TSH: TSH: 55.83 u[IU]/mL — ABNORMAL HIGH (ref 0.35–5.50)

## 2023-05-30 LAB — T4, FREE: Free T4: 0.27 ng/dL — ABNORMAL LOW (ref 0.60–1.60)

## 2023-06-04 DIAGNOSIS — M9903 Segmental and somatic dysfunction of lumbar region: Secondary | ICD-10-CM | POA: Diagnosis not present

## 2023-06-04 DIAGNOSIS — M62830 Muscle spasm of back: Secondary | ICD-10-CM | POA: Diagnosis not present

## 2023-06-04 DIAGNOSIS — M9904 Segmental and somatic dysfunction of sacral region: Secondary | ICD-10-CM | POA: Diagnosis not present

## 2023-06-04 DIAGNOSIS — M545 Low back pain, unspecified: Secondary | ICD-10-CM | POA: Diagnosis not present

## 2023-06-06 ENCOUNTER — Encounter: Payer: Self-pay | Admitting: Family Medicine

## 2023-06-06 ENCOUNTER — Ambulatory Visit (INDEPENDENT_AMBULATORY_CARE_PROVIDER_SITE_OTHER): Admitting: Family Medicine

## 2023-06-06 VITALS — BP 130/84 | HR 69 | Temp 98.3°F | Ht <= 58 in | Wt 128.2 lb

## 2023-06-06 DIAGNOSIS — E039 Hypothyroidism, unspecified: Secondary | ICD-10-CM | POA: Diagnosis not present

## 2023-06-06 DIAGNOSIS — I10 Essential (primary) hypertension: Secondary | ICD-10-CM

## 2023-06-06 DIAGNOSIS — N182 Chronic kidney disease, stage 2 (mild): Secondary | ICD-10-CM | POA: Diagnosis not present

## 2023-06-06 DIAGNOSIS — M8589 Other specified disorders of bone density and structure, multiple sites: Secondary | ICD-10-CM | POA: Diagnosis not present

## 2023-06-06 DIAGNOSIS — Z0000 Encounter for general adult medical examination without abnormal findings: Secondary | ICD-10-CM | POA: Diagnosis not present

## 2023-06-06 DIAGNOSIS — E7841 Elevated Lipoprotein(a): Secondary | ICD-10-CM | POA: Diagnosis not present

## 2023-06-06 LAB — POC URINALSYSI DIPSTICK (AUTOMATED)
Bilirubin, UA: NEGATIVE
Blood, UA: 50 — AB
Glucose, UA: NEGATIVE
Ketones, UA: NEGATIVE
Nitrite, UA: NEGATIVE
Protein, UA: NEGATIVE
Spec Grav, UA: 1.02 (ref 1.010–1.025)
Urobilinogen, UA: 0.2 U/dL
pH, UA: 6 (ref 5.0–8.0)

## 2023-06-06 LAB — POCT UA - MICROSCOPIC ONLY

## 2023-06-06 MED ORDER — LEVOTHYROXINE SODIUM 25 MCG PO TABS: 25.0000 ug | ORAL_TABLET | Freq: Every day | ORAL | 1 refills | Status: DC

## 2023-06-06 NOTE — Progress Notes (Signed)
 Subjective:    Patient ID: Terri Espinoza, female    DOB: 1955/04/18, 68 y.o.   MRN: [id number]  HPI  Here for health maintenance exam and to review chronic medical problems   Wt Readings from Last 3 Encounters:  06/06/23 128 lb 4 oz (58.2 kg)  03/25/23 128 lb (58.1 kg)  05/09/22 122 lb 6 oz (55.5 kg)   26.80 kg/m  Vitals:   06/06/23 0954  BP: 130/84  Pulse: 69  Temp: 98.3 F (36.8 C)  SpO2: 97%    Immunization History  Administered Date(s) Administered   Influenza, High Dose Seasonal PF 11/20/2022   Influenza, Seasonal, Injecte, Preservative Fre 10/14/2020   PFIZER(Purple Top)SARS-COV-2 Vaccination 05/15/2019, 06/10/2019, 01/22/2020   Pneumococcal Polysaccharide-23 06/08/2021   Td 02/13/1995, 07/15/2008   Tdap 05/22/2022   Zoster Recombinant(Shingrix) 09/02/2016, 11/11/2016    Health Maintenance Due  Topic Date Due   DEXA SCAN  Never done   Pneumonia Vaccine 9+ Years old (2 of 2 - PCV) 06/09/2022   MAMMOGRAM  08/23/2022   Medicare Annual Wellness (AWV)  05/09/2023   Tetanus shot -utd   Pna -had pna 23 in 223  Mammogram-has planned at gyn in July   Self breast exam- no lumps   Gyn health-no problems  Weaning off estrogen right now    Colon cancer screening -colonoscopy 03/2017  10 y reall   Bone health  Dexa -plans to get at gyn in July  Osteopenia  Falls-none  Fractures-none  Supplements ca with D and mag  Last vitamin D  Lab Results  Component Value Date   VD25OH 43.29 05/02/2022    Exercise :  Goes to the gym 3 times per week and uses weight / silver sneaker classes and yoga  Walking   12-18000 steps per day  Mowing and heavy yard work   [employer] her back moving furniture Doing better now    Mood    03/25/2023    1:58 PM 05/09/2022    9:01 AM 05/05/2021    9:57 AM 05/01/2021   12:08 PM 10/22/2016    2:45 PM  Depression screen PHQ 2/9  Decreased Interest 0 0 0 0 0  Down, Depressed, Hopeless 0 0 0 0 0  PHQ - 2 Score 0 0 0 0 0     HTN bp is stable today  No cp or palpitations or headaches or edema  No side effects to medicines  BP Readings from Last 3 Encounters:  06/06/23 130/84  03/25/23 120/74  07/04/22 100/65    Lisiniopril 2.5 mg daily   Sees nephrology for CKD   Lab Results  Component Value Date   NA 139 05/30/2023   K 4.4 05/30/2023   CO2 30 05/30/2023   GLUCOSE 80 05/30/2023   BUN 24 (H) 05/30/2023   CREATININE 1.25 (H) 05/30/2023   CALCIUM  9.1 05/30/2023   GFR 44.50 (L) 05/30/2023   GFRNONAA 67.61 08/01/2009   Nephrologist wanted us  to do urinalysis ?  Trying to stay hydrated   Hypothyroid Has been subclinical in past  Now worse  Is feeling tired  Lab Results  Component Value Date   TSH 55.83 (H) 05/30/2023   Free T4 is 0.27   No neck swelling   Hyperlipidemia  Lab Results  Component Value Date   CHOL 204 (H) 05/30/2023   CHOL 175 09/17/2022   CHOL 247 (H) 08/09/2022   Lab Results  Component Value Date   HDL 79.30 05/30/2023   HDL 65.70 09/17/2022  HDL 60.60 08/09/2022   Lab Results  Component Value Date   LDLCALC 110 (H) 05/30/2023   LDLCALC 86 09/17/2022   LDLCALC 157 (H) 08/09/2022   Lab Results  Component Value Date   TRIG 76.0 05/30/2023   TRIG 116.0 09/17/2022   TRIG 147.0 08/09/2022   Lab Results  Component Value Date   CHOLHDL 3 05/30/2023   CHOLHDL 3 09/17/2022   CHOLHDL 4 08/09/2022   Lab Results  Component Value Date   LDLDIRECT 149.9 12/30/2012   LDLDIRECT 140.0 12/03/2011   LDLDIRECT 128.4 09/04/2010   LDL of 110  Both LDL and HDL are up Crestor  10 mg daily  No missed doses  Diet -not optimal  Too many chips and snack foods   Urinalysis today Results for orders placed or performed in visit on 06/06/23  POCT Urinalysis Dipstick (Automated)   Collection Time: 06/06/23 11:00 AM  Result Value Ref Range   Color, UA Yellow    Clarity, UA Clear    Glucose, UA Negative Negative   Bilirubin, UA Negative    Ketones, UA Negative     Spec Grav, UA 1.020 1.010 - 1.025   Blood, UA 50 Ery/uL (A)    pH, UA 6.0 5.0 - 8.0   Protein, UA Negative Negative   Urobilinogen, UA 0.2 0.2 or 1.0 E.U./dL   Nitrite, UA Negative    Leukocytes, UA Trace (A) Negative  POCT UA - Microscopic Only   Collection Time: 06/06/23  1:51 PM  Result Value Ref Range   WBC, Ur, HPF, POC 0-1 0 - 5   RBC, Urine, Miroscopic 0-1 0 - 2   Bacteria, U Microscopic few None - Trace   Mucus, UA few    Epithelial cells, urine per micros few    Crystals, Ur, HPF, POC none    Casts, Ur, LPF, POC none    Yeast, UA none       Patient Active Problem List   Diagnosis Date Noted   Osteopenia 05/09/2022   Other nonthrombocytopenic purpura (HCC) 05/09/2022   Welcome to Medicare preventive visit 05/07/2021   CKD (chronic kidney disease) stage 2, GFR 60-89 ml/min 09/28/2019   Prolapsed internal hemorrhoids, grade 2 09/07/2013   Generalized anxiety disorder 03/05/2013   Hypothyroidism 04/24/2012   HLD (hyperlipidemia) 04/24/2012   Routine general medical examination at a health care facility 09/12/2010   INCONTINENCE, URGE 12/19/2009   Allergic rhinitis 08/08/2009   DEPRESSION, MILD 01/29/2007   Essential hypertension 01/29/2007   Past Medical History:  Diagnosis Date   Allergic rhinitis    Allergy    Anal fissure    Anxiety    Arthritis    Basal cell carcinoma 06/22/2020   Right prox lat calf near knee - [facility] 07/05/20   Basal cell carcinoma (BCC) 11/16/2021   left abdomen at costal area - ED&C   Cancer (HCC)    melanoma on back and upper abdomen  BCC also multiple   Dysplastic nevus 09/15/2019   R mid medial scapula, moderate to severe, shave removal   Dysplastic nevus 07/04/2022   Left scapula - severe - recheck on follow up 01/02/2023   Hemorrhoids    HLD (hyperlipidemia)    HTN (hypertension)    Hx of basal cell carcinoma 01/11/2006   chest   Hx of basal cell carcinoma 12/08/2009   L anticubital   Hx of basal cell carcinoma 12/29/2013    L proximal antecubital   Hx of basal cell carcinoma 12/23/2014  R low back   Hx of dysplastic nevus 01/24/2007   R upper back, moderate atypia   Hx of dysplastic nevus 06/22/2014   R post medial mid thigh   Hx of dysplastic nevus 12/06/2014   R post waistline, mild atypia   Hx of dysplastic nevus 12/06/2014   R sup lat buttocks, mild atypia   Hypothyroidism    pt. denies   Melanoma (HCC) 12/29/2009   R mid back, Melanoma IS   Osteopenia    Squamous cell carcinoma of skin 11/16/2021   SCCIS R tricep - ED&C 01/30/22   Uterine fibroid    Past Surgical History:  Procedure Laterality Date   ABDOMINAL HYSTERECTOMY  1995   partial, fibroids, Dr. Andree Kayser   COLONOSCOPY  2009   FLEXIBLE SIGMOIDOSCOPY  2002   HEMORRHOID BANDING     skin cancer removal     melanoma upper abdomen/back   BCC multiple    URETHRAL DILATION  1988   Dr. Dariel Edelson   Social History   Tobacco Use   Smoking status: Former    Current packs/day: 0.00    Average packs/day: 0.5 packs/day for 5.0 years (2.5 ttl pk-yrs)    Types: Cigarettes    Quit date: 02/12/1993    Years since quitting: 30.3   Smokeless tobacco: Never   Tobacco comments:    quit 25 years ago  Vaping Use   Vaping status: Never Used  Substance Use Topics   Alcohol use: No   Drug use: No   Family History  Problem Relation Age of Onset   Hypertension Mother    Hyperlipidemia Mother    Arthritis Mother    Alcohol abuse Father    Cirrhosis Father    Colon polyps Father    Colon polyps Sister    Hypertension Sister    Hypertension Maternal Aunt    Lung cancer Paternal Aunt    Colon cancer Neg Hx    Esophageal cancer Neg Hx    Stomach cancer Neg Hx    Rectal cancer Neg Hx    No Known Allergies Current Outpatient Medications on File Prior to Visit  Medication Sig Dispense Refill   Calcium -Magnesium-Vitamin D  (CALCIUM  500 PO) Take by mouth.     cholecalciferol (VITAMIN D ) 1000 UNITS tablet Take 5,000 Units by mouth daily.     estradiol  (ESTRACE) 1 MG tablet Take 1 mg by mouth daily.     fluticasone  (FLONASE ) 50 MCG/ACT nasal spray Place 2 sprays into both nostrils daily. 16 g 6   lisinopril  (ZESTRIL ) 2.5 MG tablet TAKE 1 TABLET BY MOUTH DAILY 90 tablet 0   loratadine (CLARITIN) 5 MG chewable tablet Chew 5 mg by mouth daily.     Magnesium 100 MG CAPS Take by mouth.     Multiple Vitamin (MULTIVITAMIN) tablet Take 1 tablet by mouth daily.     Multiple Vitamins-Minerals (EMERGEN-C BLUE PO) Take by mouth.     Multiple Vitamins-Minerals (ZINC PO) Take by mouth.     rosuvastatin  (CRESTOR ) 10 MG tablet TAKE 1 TABLET BY MOUTH DAILY. 90 tablet 0   No current facility-administered medications on file prior to visit.    Review of Systems  Constitutional:  Positive for fatigue. Negative for activity change, appetite change, fever and unexpected weight change.  HENT:  Negative for congestion, ear pain, rhinorrhea, sinus pressure and sore throat.   Eyes:  Negative for pain, redness and visual disturbance.  Respiratory:  Negative for cough, shortness of breath and wheezing.  Cardiovascular:  Negative for chest pain and palpitations.  Gastrointestinal:  Negative for abdominal pain, blood in stool, constipation and diarrhea.  Endocrine: Negative for polydipsia and polyuria.  Genitourinary:  Negative for dysuria, frequency and urgency.  Musculoskeletal:  Negative for arthralgias, back pain and myalgias.  Skin:  Negative for pallor and rash.  Allergic/Immunologic: Negative for environmental allergies.  Neurological:  Negative for dizziness, syncope and headaches.  Hematological:  Negative for adenopathy. Does not bruise/bleed easily.  Psychiatric/Behavioral:  Negative for decreased concentration and dysphoric mood. The patient is not nervous/anxious.        Objective:   Physical Exam Constitutional:      General: She is not in acute distress.    Appearance: Normal appearance. She is well-developed and normal weight. She is not  ill-appearing or diaphoretic.  HENT:     Head: Normocephalic and atraumatic.     Right Ear: Tympanic membrane, ear canal and external ear normal.     Left Ear: Tympanic membrane, ear canal and external ear normal.     Nose: Nose normal. No congestion.     Mouth/Throat:     Mouth: Mucous membranes are moist.     Pharynx: Oropharynx is clear. No posterior oropharyngeal erythema.  Eyes:     General: No scleral icterus.    Extraocular Movements: Extraocular movements intact.     Conjunctiva/sclera: Conjunctivae normal.     Pupils: Pupils are equal, round, and reactive to light.  Neck:     Thyroid : No thyromegaly.     Vascular: No carotid bruit or JVD.  Cardiovascular:     Rate and Rhythm: Normal rate and regular rhythm.     Pulses: Normal pulses.     Heart sounds: Normal heart sounds.     No gallop.  Pulmonary:     Effort: Pulmonary effort is normal. No respiratory distress.     Breath sounds: Normal breath sounds. No wheezing.     Comments: Good air exch Chest:     Chest wall: No tenderness.  Abdominal:     General: Bowel sounds are normal. There is no distension or abdominal bruit.     Palpations: Abdomen is soft. There is no mass.     Tenderness: There is no abdominal tenderness.     Hernia: No hernia is present.  Genitourinary:    Comments: Breast and pelvic exam are done by gyn provider   Musculoskeletal:        General: No tenderness. Normal range of motion.     Cervical back: Normal range of motion and neck supple. No rigidity. No muscular tenderness.     Right lower leg: No edema.     Left lower leg: No edema.     Comments: No kyphosis   Lymphadenopathy:     Cervical: No cervical adenopathy.  Skin:    General: Skin is warm and dry.     Coloration: Skin is not pale.     Findings: No erythema or rash.     Comments: Solar lentigines diffusely   Neurological:     Mental Status: She is alert. Mental status is at baseline.     Cranial Nerves: No cranial nerve  deficit.     Motor: No abnormal muscle tone.     Coordination: Coordination normal.     Gait: Gait normal.     Deep Tendon Reflexes: Reflexes are normal and symmetric. Reflexes normal.  Psychiatric:        Mood and Affect: Mood normal.  Cognition and Memory: Cognition and memory normal.           Assessment & Plan:   Problem List Items Addressed This Visit       Cardiovascular and Mediastinum   Essential hypertension   bp in fair control at this time  BP Readings from Last 1 Encounters:  06/06/23 130/84   No changes needed Most recent labs reviewed  Disc lifstyle change with low sodium diet and exercise  Plan to continue lisinopril  2.5 mg daily and good habits        Endocrine   Hypothyroidism   Now fatigue-no longer sub clinical  Reassuring exam Lab Results  Component Value Date   TSH 55.83 (H) 05/30/2023   FT4 low at 0.27  Plan to start levothyroxine  25 mcg in am /discussed proper way to take  TSLH 4-6 wk   Expect gradual clinical improvement -will continue to titrate dose to need       Relevant Medications   levothyroxine  (SYNTHROID ) 25 MCG tablet     Musculoskeletal and Integument   Osteopenia   Planning dexa at gyn this summer  No falls or fracture  Discussed fall prevention, supplements and exercise for bone density          Genitourinary   CKD (chronic kidney disease) stage 2, GFR 60-89 ml/min   Continues nephrology care Lisinopril  2.5 mg daily  Urinalysis today - neg micro  GFR 44.5       Relevant Orders   POCT Urinalysis Dipstick (Automated) (Completed)   POCT UA - Microscopic Only (Completed)     Other   Routine general medical examination at a health care facility - Primary   Reviewed health habits including diet and exercise and skin cancer prevention Reviewed appropriate screening tests for age  Also reviewed health mt list, fam hx and immunization status , as well as social and family history   See HPI Labs reviewed  and ordered Health Maintenance  Topic Date Due   DEXA scan (bone density measurement)  Never done   Pneumonia Vaccine (2 of 2 - PCV) 06/09/2022   Mammogram  08/23/2022   Medicare Annual Wellness Visit  05/09/2023   COVID-19 Vaccine (4 - 2024-25 season) 06/21/2025*   Flu Shot  09/13/2023   Colon Cancer Screening  04/02/2027   DTaP/Tdap/Td vaccine (4 - Td or Tdap) 05/21/2032   Hepatitis C Screening  Completed   Zoster (Shingles) Vaccine  Completed   HPV Vaccine  Aged Out   Meningitis B Vaccine  Aged Out  *Topic was postponed. The date shown is not the original due date.   Sees gyn yearly  Discussed fall prevention, supplements and exercise for bone density  -sent for last dexa and mammo / has follow up in July  PHQ 0         HLD (hyperlipidemia)   Disc goals for lipids and reasons to control them Rev last labs with pt Rev low sat fat diet in detail Both LDL and HDL up a bit  Continues crestor  10 mg daily

## 2023-06-06 NOTE — Patient Instructions (Addendum)
 Keep up the great exercise with cardio and strength training    Start levothyroxine  25 mcg one pill daily in am at least 30 minutes before eating or taking other medicines or vitamins   Schedule non fasting labs for 4-6 weeks to re check this  We will gradually increase dose until in normal range   For cholesterol Avoid red meat/ fried foods/ egg yolks/ fatty breakfast meats/ butter, cheese and high fat dairy/ and shellfish    Take care of yourself   Urinalysis on the way out

## 2023-06-06 NOTE — Assessment & Plan Note (Signed)
 Continues nephrology care Lisinopril  2.5 mg daily  Urinalysis today - neg micro  GFR 44.5

## 2023-06-06 NOTE — Assessment & Plan Note (Signed)
 Planning dexa at gyn this summer  No falls or fracture  Discussed fall prevention, supplements and exercise for bone density

## 2023-06-06 NOTE — Assessment & Plan Note (Signed)
 bp in fair control at this time  BP Readings from Last 1 Encounters:  06/06/23 130/84   No changes needed Most recent labs reviewed  Disc lifstyle change with low sodium diet and exercise  Plan to continue lisinopril  2.5 mg daily and good habits

## 2023-06-06 NOTE — Assessment & Plan Note (Signed)
 Reviewed health habits including diet and exercise and skin cancer prevention Reviewed appropriate screening tests for age  Also reviewed health mt list, fam hx and immunization status , as well as social and family history   See HPI Labs reviewed and ordered Health Maintenance  Topic Date Due   DEXA scan (bone density measurement)  Never done   Pneumonia Vaccine (2 of 2 - PCV) 06/09/2022   Mammogram  08/23/2022   Medicare Annual Wellness Visit  05/09/2023   COVID-19 Vaccine (4 - 2024-25 season) 06/21/2025*   Flu Shot  09/13/2023   Colon Cancer Screening  04/02/2027   DTaP/Tdap/Td vaccine (4 - Td or Tdap) 05/21/2032   Hepatitis C Screening  Completed   Zoster (Shingles) Vaccine  Completed   HPV Vaccine  Aged Out   Meningitis B Vaccine  Aged Out  *Topic was postponed. The date shown is not the original due date.   Sees gyn yearly  Discussed fall prevention, supplements and exercise for bone density  -sent for last dexa and mammo / has follow up in July  PHQ 0

## 2023-06-06 NOTE — Assessment & Plan Note (Addendum)
 Now fatigue-no longer sub clinical  Reassuring exam Lab Results  Component Value Date   TSH 55.83 (H) 05/30/2023   FT4 low at 0.27  Plan to start levothyroxine  25 mcg in am /discussed proper way to take  [facility] 4-6 wk   Expect gradual clinical improvement -will continue to titrate dose to need

## 2023-06-06 NOTE — Assessment & Plan Note (Signed)
 Disc goals for lipids and reasons to control them Rev last labs with pt Rev low sat fat diet in detail Both LDL and HDL up a bit  Continues crestor  10 mg daily

## 2023-06-18 DIAGNOSIS — M9904 Segmental and somatic dysfunction of sacral region: Secondary | ICD-10-CM | POA: Diagnosis not present

## 2023-06-18 DIAGNOSIS — M62830 Muscle spasm of back: Secondary | ICD-10-CM | POA: Diagnosis not present

## 2023-06-18 DIAGNOSIS — M545 Low back pain, unspecified: Secondary | ICD-10-CM | POA: Diagnosis not present

## 2023-06-18 DIAGNOSIS — M9903 Segmental and somatic dysfunction of lumbar region: Secondary | ICD-10-CM | POA: Diagnosis not present

## 2023-06-27 ENCOUNTER — Ambulatory Visit: Payer: HMO | Admitting: Dermatology

## 2023-07-05 ENCOUNTER — Ambulatory Visit (INDEPENDENT_AMBULATORY_CARE_PROVIDER_SITE_OTHER)

## 2023-07-05 VITALS — Ht <= 58 in | Wt 128.0 lb

## 2023-07-05 DIAGNOSIS — Z0000 Encounter for general adult medical examination without abnormal findings: Secondary | ICD-10-CM | POA: Diagnosis not present

## 2023-07-05 NOTE — Progress Notes (Signed)
 Subjective:   Terri Espinoza is a 68 y.o. who presents for a Medicare Wellness preventive visit.  As a reminder, Annual Wellness Visits don't include a physical exam, and some assessments may be limited, especially if this visit is performed virtually. We may recommend an in-person follow-up visit with your provider if needed.  Visit Complete: Virtual I connected with  Terri Espinoza on 07/05/23 by a audio enabled telemedicine application and verified that I am speaking with the correct person using two identifiers.  Patient Location: Home  Provider Location: Office/Clinic  I discussed the limitations of evaluation and management by telemedicine. The patient expressed understanding and agreed to proceed.  Vital Signs: Because this visit was a virtual/telehealth visit, some criteria may be missing or patient reported. Any vitals not documented were not able to be obtained and vitals that have been documented are patient reported.  VideoDeclined- This patient declined Librarian, academic. Therefore the visit was completed with audio only.  Persons Participating in Visit: Patient.  AWV Questionnaire: Yes: Patient Medicare AWV questionnaire was completed by the patient on 07/02/23; I have confirmed that all information answered by patient is correct and no changes since this date.  Cardiac Risk Factors include: advanced age (>77men, >82 women);dyslipidemia;hypertension    Objective:     Today's Vitals   07/05/23 1337  Weight: 128 lb (58.1 kg)  Height: 4\' 10"  (1.473 m)   Body mass index is 26.75 kg/m.     07/05/2023    1:44 PM 05/09/2022    9:02 AM  Advanced Directives  Does Patient Have a Medical Advance Directive? Yes Yes  Type of Estate agent of [place];Living will Healthcare Power of [place];Living will  Copy of Healthcare Power of Attorney in Chart? Yes - validated most recent copy scanned in chart (See row  information) No - copy requested    Current Medications (verified) Outpatient Encounter Medications as of 07/05/2023  Medication Sig   Calcium -Magnesium-Vitamin D  (CALCIUM  500 PO) Take by mouth.   cholecalciferol (VITAMIN D ) 1000 UNITS tablet Take 5,000 Units by mouth daily.   estradiol (ESTRACE) 1 MG tablet Take 1 mg by mouth daily.   fluticasone  (FLONASE ) 50 MCG/ACT nasal spray Place 2 sprays into both nostrils daily.   levothyroxine  (SYNTHROID ) 25 MCG tablet Take 1 tablet (25 mcg total) by mouth daily.   lisinopril  (ZESTRIL ) 2.5 MG tablet TAKE 1 TABLET BY MOUTH DAILY   loratadine (CLARITIN) 5 MG chewable tablet Chew 5 mg by mouth daily.   Magnesium 100 MG CAPS Take by mouth.   Multiple Vitamin (MULTIVITAMIN) tablet Take 1 tablet by mouth daily.   Multiple Vitamins-Minerals (EMERGEN-C BLUE PO) Take by mouth.   Multiple Vitamins-Minerals (ZINC PO) Take by mouth.   rosuvastatin  (CRESTOR ) 10 MG tablet TAKE 1 TABLET BY MOUTH DAILY.   No facility-administered encounter medications on file as of 07/05/2023.    Allergies (verified) Patient has no known allergies.   History: Past Medical History:  Diagnosis Date   Allergic rhinitis    Allergy    Anal fissure    Anxiety    Arthritis    Basal cell carcinoma 06/22/2020   Right prox lat calf near knee - [facility] 07/05/20   Basal cell carcinoma (BCC) 11/16/2021   left abdomen at costal area - ED&C   Cancer (HCC)    melanoma on back and upper abdomen  BCC also multiple   Dysplastic nevus 09/15/2019   R mid medial scapula, moderate to  severe, shave removal   Dysplastic nevus 07/04/2022   Left scapula - severe - recheck on follow up 01/02/2023   Hemorrhoids    HLD (hyperlipidemia)    HTN (hypertension)    Hx of basal cell carcinoma 01/11/2006   chest   Hx of basal cell carcinoma 12/08/2009   L anticubital   Hx of basal cell carcinoma 12/29/2013   L proximal antecubital   Hx of basal cell carcinoma 12/23/2014   R low back   Hx of  dysplastic nevus 01/24/2007   R upper back, moderate atypia   Hx of dysplastic nevus 06/22/2014   R post medial mid thigh   Hx of dysplastic nevus 12/06/2014   R post waistline, mild atypia   Hx of dysplastic nevus 12/06/2014   R sup lat buttocks, mild atypia   Hypothyroidism    pt. denies   Melanoma (HCC) 12/29/2009   R mid back, Melanoma IS   Osteopenia    Squamous cell carcinoma of skin 11/16/2021   SCCIS R tricep - ED&C 01/30/22   Uterine fibroid    Past Surgical History:  Procedure Laterality Date   ABDOMINAL HYSTERECTOMY  1995   partial, fibroids, Dr. Andree Kayser   COLONOSCOPY  2009   FLEXIBLE SIGMOIDOSCOPY  2002   HEMORRHOID BANDING     skin cancer removal     melanoma upper abdomen/back   BCC multiple    URETHRAL DILATION  1988   Dr. Dariel Edelson   Family History  Problem Relation Age of Onset   Hypertension Mother    Hyperlipidemia Mother    Arthritis Mother    Alcohol abuse Father    Cirrhosis Father    Colon polyps Father    Colon polyps Sister    Hypertension Sister    Hypertension Maternal Aunt    Lung cancer Paternal Aunt    Colon cancer Neg Hx    Esophageal cancer Neg Hx    Stomach cancer Neg Hx    Rectal cancer Neg Hx    Social History   Socioeconomic History   Marital status: Divorced    Spouse name: Not on file   Number of children: 0   Years of education: Not on file   Highest education level: Some college, no degree  Occupational History   Occupation: Conservator, museum/gallery, 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 One caffeinated beverage daily. No children.   Lives alone, next door to her mother. Has a dog. Retiring 2019, will continue to work part time.    Social Drivers of Corporate investment banker Strain: Low Risk  (07/05/2023)   Overall Financial Resource Strain (CARDIA)    Difficulty of Paying Living Expenses: Not hard at all  Food Insecurity: No Food Insecurity (07/05/2023)   Hunger Vital Sign    Worried About Running Out  of Food in the Last Year: Never true    Ran Out of Food in the Last Year: Never true  Transportation Needs: No Transportation Needs (07/05/2023)   PRAPARE - Administrator, Civil Service (Medical): No    Lack of Transportation (Non-Medical): No  Physical Activity: Sufficiently Active (07/05/2023)   Exercise Vital Sign    Days of Exercise per Week: 5 days    Minutes of Exercise per Session: 60 min  Stress: No Stress Concern Present (07/05/2023)   [employer] of Occupational Health - Occupational Stress Questionnaire    Feeling of Stress : Not at all  Social Connections: Moderately Isolated (07/05/2023)   Social Connection and Isolation Panel [NHANES]    Frequency of Communication with Friends and Family: More than three times a week    Frequency of Social Gatherings with Friends and Family: Twice a week    Attends Religious Services: 1 to 4 times per year    Active Member of [employer] or Organizations: No    Attends Engineer, structural: Never    Marital Status: Divorced    Tobacco Counseling Counseling given: Not Answered Tobacco comments: quit 25 years ago    Clinical Intake:  Pre-visit preparation completed: Yes  Pain : No/denies pain     BMI - recorded: 26.75 Nutritional Status: BMI 25 -29 Overweight Nutritional Risks: None Diabetes: No  No results found for: "HGBA1C"   How often do you need to have  someone help you when you read instructions, pamphlets, or other written materials from your doctor or pharmacy?: 1 - Never  Interpreter Needed?: No  Comments: lives alone Information entered by :: B.Xitlaly Ault,LPN   Activities of Daily Living     07/02/2023   11:34 AM  In your present state of health, do you have any difficulty performing the following activities:  Hearing? 0  Vision? 0  Difficulty concentrating or making decisions? 0  Walking or climbing stairs? 0  Dressing or bathing? 0  Doing errands, shopping? 0  Preparing Food and eating ? N  Using the Toilet? N  In the past six months, have you accidently leaked urine? N  Do you have problems with loss of bowel control? N  Managing your Medications? N  Managing your Finances? N  Housekeeping or managing your Housekeeping? N    Patient Care Team: Tower, Manley Seeds, MD as PCP - General (Family Medicine) Arminda Berth, OD (Optometry)  Indicate any recent Medical Services you may have received from other than [facility] providers in the past year (date may be approximate).     Assessment:    This is a routine wellness examination for [place].  Hearing/Vision screen Hearing Screening - Comments:: Pt says her hearing is good Vision Screening - Comments:: Pt says vision is good w/glasses [facility]   Goals Addressed             This Visit's Progress    Patient Stated   On track    Lose 5-10 pounds.       Depression Screen     07/05/2023    1:41 PM 03/25/2023    1:58 PM 05/09/2022    9:01 AM 05/05/2021    9:57 AM 05/01/2021   12:08 PM 10/22/2016    2:45 PM 12/30/2012    2:40 PM  PHQ 2/9 Scores  PHQ - 2 Score 0 0 0 0 0 0 0  Exception Documentation      Patient refusal  Fall Risk     07/02/2023   11:34 AM 06/06/2023    9:53 AM 03/25/2023    1:58 PM 05/09/2022    9:02 AM 05/05/2021    9:57 AM  Fall Risk   Falls in the past year? 0 0 0 0 0  Number falls in past yr: 0 0 0 0   Injury with Fall? 0 0 0 0    Risk for fall due to : No Fall Risks No Fall Risks No Fall Risks No Fall Risks   Follow up Education provided;Falls prevention discussed Falls evaluation completed Falls evaluation completed Falls prevention discussed;Falls evaluation completed Falls evaluation completed    MEDICARE RISK AT HOME:  Medicare Risk at Home Any stairs in or around the home?: (Patient-Rptd) No If so, are there any without handrails?: (Patient-Rptd) No Home free of loose throw rugs in walkways, pet beds, electrical cords, etc?: (Patient-Rptd) Yes Adequate lighting in your home to reduce risk of falls?: (Patient-Rptd) Yes Life alert?: (Patient-Rptd) No Use of a cane, walker or w/c?: (Patient-Rptd) No Grab bars in the bathroom?: (Patient-Rptd) No Shower chair or bench in shower?: (Patient-Rptd) No Elevated toilet seat or a handicapped toilet?: (Patient-Rptd) No  TIMED UP AND GO:  Was the test performed?  No  Cognitive Function: 6CIT completed        07/05/2023    1:45 PM 05/09/2022    9:04 AM  6CIT Screen  What Year? 0 points 0 points  What month? 0 points 0 points  What time? 0 points 0 points  Count back from 20 0 points 0 points  Months in reverse 0 points 0 points  Repeat phrase 0 points 0 points  Total Score 0 points 0 points    Immunizations Immunization History  Administered Date(s) Administered   Influenza, High Dose Seasonal PF 11/20/2022   Influenza, Seasonal, Injecte, Preservative Fre 10/14/2020   PFIZER(Purple Top)SARS-COV-2 Vaccination 05/15/2019, 06/10/2019, 01/22/2020   Pneumococcal Polysaccharide-23 06/08/2021   Td 02/13/1995, 07/15/2008   Tdap 05/22/2022   Zoster Recombinant(Shingrix) 09/02/2016, 11/11/2016    Screening Tests Health Maintenance  Topic Date Due   DEXA SCAN  Never done   Pneumonia Vaccine 59+ Years old (2 of 2 - PCV) 06/09/2022   COVID-19 Vaccine (4 - 2024-25 season) 06/21/2025 (Originally 10/14/2022)   MAMMOGRAM  08/27/2023   INFLUENZA VACCINE  09/13/2023    Medicare Annual Wellness (AWV)  07/04/2024   Colonoscopy  04/02/2027   DTaP/Tdap/Td (4 - Td or Tdap) 05/21/2032   Hepatitis C Screening  Completed   Zoster Vaccines- Shingrix  Completed   HPV VACCINES  Aged Out   Meningococcal B Vaccine  Aged Out    Health Maintenance  Health Maintenance Due  Topic Date Due   DEXA SCAN  Never done   Pneumonia Vaccine 50+ Years old (2 of 2 - PCV) 06/09/2022   Health Maintenance Items Addressed: None due at thistime  Additional Screening:  Vision Screening: Recommended annual ophthalmology exams for early detection of glaucoma and other disorders of the eye.  Dental Screening: Recommended annual dental exams for proper oral hygiene  Community Resource Referral / Chronic Care Management: CRR required this visit?  No   CCM required this visit?  No   Plan:    I have personally reviewed and noted the following in the patient's chart:   Medical and social history Use of alcohol, tobacco or illicit drugs  Current medications and supplements including opioid prescriptions. Patient is not currently taking opioid prescriptions. Functional  ability and status Nutritional status Physical activity Advanced directives List of other physicians Hospitalizations, surgeries, and ER visits in previous 12 months Vitals Screenings to include cognitive, depression, and falls Referrals and appointments  In addition, I have reviewed and discussed with patient certain preventive protocols, quality metrics, and best practice recommendations. A written personalized care plan for preventive services as well as general preventive health recommendations were provided to patient.   Nerissa Bannister, LPN   1/61/0960   After Visit Summary: (MyChart) Due to this being a telephonic visit, the after visit summary with patients personalized plan was offered to patient via MyChart   Notes: Nothing significant to report at this time.

## 2023-07-05 NOTE — Patient Instructions (Signed)
 Terri Espinoza , Thank you for taking time out of your busy schedule to complete your Annual Wellness Visit with me. I enjoyed our conversation and look forward to speaking with you again next year. I, as well as your care team,  appreciate your ongoing commitment to your health goals. Please review the following plan we discussed and let me know if I can assist you in the future. Your Game plan/ To Do List     Follow up Visits: Next Medicare AWV with our clinical staff: 07/08/24 @ 3pm televisit   Have you seen your provider in the last 6 months (3 months if uncontrolled diabetes)? Yes Next Office Visit with your provider: not scheduled yet:due 05/2024  Clinician Recommendations:  Aim for 30 minutes of exercise or brisk walking, 6-8 glasses of water, and 5 servings of fruits and vegetables each day.       This is a list of the screening recommended for you and due dates:  Health Maintenance  Topic Date Due   DEXA scan (bone density measurement)  Never done   Pneumonia Vaccine (2 of 2 - PCV) 06/09/2022   COVID-19 Vaccine (4 - 2024-25 season) 06/21/2025*   Mammogram  08/27/2023   Flu Shot  09/13/2023   Medicare Annual Wellness Visit  07/04/2024   Colon Cancer Screening  04/02/2027   DTaP/Tdap/Td vaccine (4 - Td or Tdap) 05/21/2032   Hepatitis C Screening  Completed   Zoster (Shingles) Vaccine  Completed   HPV Vaccine  Aged Out   Meningitis B Vaccine  Aged Out  *Topic was postponed. The date shown is not the original due date.    Advanced directives: (In Chart) A copy of your advanced directives are scanned into your chart should your provider ever need it. Advance Care Planning is important because it:  [x]  Makes sure you receive the medical care that is consistent with your values, goals, and preferences  [x]  It provides guidance to your family and loved ones and reduces their decisional burden about whether or not they are making the right decisions based on your wishes.  Follow the  link provided in your after visit summary or read over the paperwork we have mailed to you to help you started getting your Advance Directives in place. If you need assistance in completing these, please reach out to us  so that we can help you!

## 2023-07-11 ENCOUNTER — Ambulatory Visit: Payer: Self-pay | Admitting: Family Medicine

## 2023-07-11 ENCOUNTER — Other Ambulatory Visit (INDEPENDENT_AMBULATORY_CARE_PROVIDER_SITE_OTHER)

## 2023-07-11 DIAGNOSIS — E039 Hypothyroidism, unspecified: Secondary | ICD-10-CM | POA: Diagnosis not present

## 2023-07-11 LAB — TSH: TSH: 11.11 u[IU]/mL — ABNORMAL HIGH (ref 0.35–5.50)

## 2023-07-11 MED ORDER — LEVOTHYROXINE SODIUM 50 MCG PO TABS: 50.0000 ug | ORAL_TABLET | Freq: Every day | ORAL | 3 refills | Status: AC

## 2023-07-12 DIAGNOSIS — M62830 Muscle spasm of back: Secondary | ICD-10-CM | POA: Diagnosis not present

## 2023-07-12 DIAGNOSIS — M9904 Segmental and somatic dysfunction of sacral region: Secondary | ICD-10-CM | POA: Diagnosis not present

## 2023-07-12 DIAGNOSIS — M545 Low back pain, unspecified: Secondary | ICD-10-CM | POA: Diagnosis not present

## 2023-07-12 DIAGNOSIS — M9903 Segmental and somatic dysfunction of lumbar region: Secondary | ICD-10-CM | POA: Diagnosis not present

## 2023-07-15 ENCOUNTER — Ambulatory Visit: Payer: Self-pay

## 2023-07-15 NOTE — Telephone Encounter (Signed)
 Copied from CRM [id number]. Topic: Clinical - Medical Advice >> Jul 15, 2023  8:38 AM Crispin Dolphin wrote: Reason for CRM: Patient called scheduled appt for labs. Wanted to know if provider would send her something else for sinus infection that has returned without appt. Previously seen for it. Thank You

## 2023-07-15 NOTE — Telephone Encounter (Signed)
 Pt already scheduled ov with [facility] for tomorrow, 6/3 for sinuses

## 2023-07-15 NOTE — Telephone Encounter (Signed)
 Needs visit for sinus evaluation unless she was seen within the last 2 weeks

## 2023-07-15 NOTE — Telephone Encounter (Signed)
 Was last seen in Feb for sinus, will route to schedulers to make an appt

## 2023-07-15 NOTE — Telephone Encounter (Signed)
 Copied from CRM [id number]. Topic: Clinical - Medical Advice >> Jul 15, 2023  8:38 AM Crispin Dolphin wrote: Reason for CRM: Patient called scheduled appt for labs. Wanted to know if provider would send her something else for sinus infection that has returned without appt. Previously seen for it. Thank You   Chief Complaint: sinus pain Symptoms: runny nose. Pain in teeth and behind eyes Frequency: constant Pertinent Negatives: Patient denies shortness of breath Disposition: [] ED /[] Urgent Care (no appt availability in office) / [x] Appointment(In office/virtual)/ []  [facility]/ [] Home Care/ [] Refused Recommended Disposition /[] [facility]/ []  Follow-up with PCP Additional Notes: Appt scheduled for 6/3    Reason for Disposition  [1] Sinus pain (not just congestion) AND [2] fever  Answer Assessment - Initial Assessment Questions 1. LOCATION: "Where does it hurt?"      Teeth and sinus hurt  2. ONSET: "When did the sinus pain start?"  (e.g., hours, days)      Started over a week ago  3. SEVERITY: "How bad is the pain?"   (Scale 1-10; mild, moderate or severe)   - MILD (1-3): doesn't interfere with normal activities    - MODERATE (4-7): interferes with normal activities (e.g., work or school) or awakens from sleep   - SEVERE (8-10): excruciating pain and patient unable to do any normal activities        Mild to moderate  4. RECURRENT SYMPTOM: "Have you ever had sinus problems before?" If Yes, ask: "When was the last time?" and "What happened that time?"      Yes, started on ab  5. NASAL CONGESTION: "Is the nose blocked?" If Yes, ask: "Can you open it or must you breathe through your mouth?"     No  6. NASAL DISCHARGE: "Do you have discharge from your nose?" If so ask, "What color?"     Nasal discharge, yellowish color  7. FEVER: "Do you have a fever?" If Yes, ask: "What is it, how was it measured, and when did it start?"      Unsure of actual temperate. Had chills and  waking in sweats in middle of night  8. OTHER SYMPTOMS: "Do you have any other symptoms?" (e.g., sore throat, cough, earache, difficulty breathing)     Cough, nasal dishcarge, hoase voice  9. PREGNANCY: "Is there any chance you are pregnant?" "When was your last menstrual period?"     no  Protocols used: Sinus Pain or Congestion-A-AH

## 2023-07-15 NOTE — Telephone Encounter (Signed)
 This RN made the second attempt to contact the pt for triage. Pt did not answer, RN LVM with a callback number.

## 2023-07-16 ENCOUNTER — Ambulatory Visit (INDEPENDENT_AMBULATORY_CARE_PROVIDER_SITE_OTHER): Admitting: General Practice

## 2023-07-16 ENCOUNTER — Encounter: Payer: Self-pay | Admitting: General Practice

## 2023-07-16 VITALS — BP 122/80 | HR 89 | Temp 98.6°F | Ht <= 58 in | Wt 127.0 lb

## 2023-07-16 DIAGNOSIS — J0140 Acute pansinusitis, unspecified: Secondary | ICD-10-CM | POA: Diagnosis not present

## 2023-07-16 MED ORDER — BENZONATATE 200 MG PO CAPS
200.0000 mg | ORAL_CAPSULE | Freq: Three times a day (TID) | ORAL | 0 refills | Status: DC | PRN
Start: 2023-07-16 — End: 2023-10-30

## 2023-07-16 MED ORDER — AMOXICILLIN-POT CLAVULANATE 875-125 MG PO TABS: 1.0000 | ORAL_TABLET | Freq: Two times a day (BID) | ORAL | 0 refills | Status: AC

## 2023-07-16 NOTE — Assessment & Plan Note (Signed)
 Symptoms suggestive of pansinusitis.   Ongoing for two weeks; suspect bacterial involvement.   Recommendations given for OTC medications for symptom management.   Start Augmentin  antibiotics. Take 1 tablet by mouth twice daily for 7 days.  Start Benzonatate  capsules for cough. Take 1 capsule by mouth three times daily as needed for cough.  Encourage rest, increase fluid intake and humidifer.   F/u with pcp if symptom worsen or do not improve.

## 2023-07-16 NOTE — Patient Instructions (Addendum)
 Start Augmentin  antibiotics. Take 1 tablet by mouth twice daily for 7 days.  Start Benzonatate  capsules for cough. Take 1 capsule by mouth three times daily as needed for cough.  You can try a few things over the counter to help with your symptoms including:  Cough: Delsym or Robitussin (get the off brand, works just as well) Chest Congestion: Mucinex (plain) Nasal Congestion/Ear Pressure/Sinus Pressure: Try using Flonase  (fluticasone ) nasal spray. Instill 1 spray in each nostril twice daily. This can be purchased over the counter. Body aches, fevers, headache: Ibuprofen (not to exceed 2400 mg in 24 hours) or Acetaminophen-Tylenol (not to exceed 3000 mg in 24 hours) Runny Nose/Throat Drainage/Sneezing/Itchy or Watery Eyes: An antihistamine such as Zyrtec, Claritin, Xyzal, Allegra  Rest, increase fluid intake and humidifier.   Follow up with pcp or with me if you are not feeling any better.   It was a pleasure meeting you!

## 2023-07-16 NOTE — Progress Notes (Signed)
 Established Patient Office Visit  Subjective   Patient ID: Terri Espinoza, female    DOB: [birth year]  Age: 68 y.o. MRN: [id number]  Chief Complaint  Patient presents with   Sinus Problem    Patient has been having a lot of sinus pain and pressure, teeth are hurting, nasal congestion x 2 weeks. Patient states it started with a sore throat for one night then the other sx came on. Patient has been taking mucinex, allergy medications, tylenol, nasal spray at night and did a sinus rinse last night.     HPI  Terri Espinoza is a 68 year old female, patient of Dr. Malissa Se, with past medical history of HTN, hypothyroidism, osteopenia, CKD, depression, HLD and GAD presents today for an acute visit.   Congestion: symptom onset was two weeks ago with a lot sinus pain, pressure, pain in teeth and nasal congestion. Initially she had sore throat but it has resolved. She is still having a productive cough with green, yellow phlegm. She does have ear popping.   She has tried Mucinex, allergy medication, tylenol, nasal spray at night and sinus rise with minimal relief.   She checked her fever yesterday and it was 99.8 F. She hadn't checked it before but did wake up with sweats.   No known history of asthma. Former smoker.  Influenza vaccine- October, 2024.  No recent pneumonia vaccine.   Denies any ear pain, chills, shortness of breath, difficulty breathing or chest pain.  Overall, her symptoms are about the same like she is just not getting any better. She wakes up feeling good but then in the morning she feels good but then by the afternoon, she feels worse again.   Patient Active Problem List   Diagnosis Date Noted   Acute pansinusitis 03/25/2023   Osteopenia 05/09/2022   Other nonthrombocytopenic purpura (HCC) 05/09/2022   Welcome to Medicare preventive visit 05/07/2021   CKD (chronic kidney disease) stage 2, GFR 60-89 ml/min 09/28/2019   Prolapsed internal hemorrhoids, grade 2 09/07/2013    Generalized anxiety disorder 03/05/2013   Hypothyroidism 04/24/2012   HLD (hyperlipidemia) 04/24/2012   Routine general medical examination at a health care facility 09/12/2010   INCONTINENCE, URGE 12/19/2009   Allergic rhinitis 08/08/2009   DEPRESSION, MILD 01/29/2007   Essential hypertension 01/29/2007   Past Medical History:  Diagnosis Date   Allergic rhinitis    Allergy    Anal fissure    Anxiety    Arthritis    Basal cell carcinoma 06/22/2020   Right prox lat calf near knee - [facility] 07/05/20   Basal cell carcinoma (BCC) 11/16/2021   left abdomen at costal area - ED&C   Cancer (HCC)    melanoma on back and upper abdomen  BCC also multiple   Dysplastic nevus 09/15/2019   R mid medial scapula, moderate to severe, shave removal   Dysplastic nevus 07/04/2022   Left scapula - severe - recheck on follow up 01/02/2023   Hemorrhoids    HLD (hyperlipidemia)    HTN (hypertension)    Hx of basal cell carcinoma 01/11/2006   chest   Hx of basal cell carcinoma 12/08/2009   L anticubital   Hx of basal cell carcinoma 12/29/2013   L proximal antecubital   Hx of basal cell carcinoma 12/23/2014   R low back   Hx of dysplastic nevus 01/24/2007   R upper back, moderate atypia   Hx of dysplastic nevus 06/22/2014   R post medial mid thigh  Hx of dysplastic nevus 12/06/2014   R post waistline, mild atypia   Hx of dysplastic nevus 12/06/2014   R sup lat buttocks, mild atypia   Hypothyroidism    pt. denies   Melanoma (HCC) 12/29/2009   R mid back, Melanoma IS   Osteopenia    Squamous cell carcinoma of skin 11/16/2021   SCCIS R tricep - ED&C 01/30/22   Uterine fibroid    Past Surgical History:  Procedure Laterality Date   ABDOMINAL HYSTERECTOMY  1995   partial, fibroids, Dr. Andree Kayser   COLONOSCOPY  2009   FLEXIBLE SIGMOIDOSCOPY  2002   HEMORRHOID BANDING     skin cancer removal     melanoma upper abdomen/back   BCC multiple    URETHRAL DILATION  1988   Dr. Dariel Edelson   No Known  Allergies       07/16/2023   11:17 AM 07/05/2023    1:41 PM 03/25/2023    1:58 PM  Depression screen PHQ 2/9  Decreased Interest 0 0 0  Down, Depressed, Hopeless 0 0 0  PHQ - 2 Score 0 0 0  Altered sleeping 0    Tired, decreased energy 1    Change in appetite 0    Feeling bad or failure about yourself  0    Trouble concentrating 0    Moving slowly or fidgety/restless 0    Suicidal thoughts 0    PHQ-9 Score 1    Difficult doing work/chores Not difficult at all         07/16/2023   11:17 AM  GAD 7 : Generalized Anxiety Score  Nervous, Anxious, on Edge 0  Control/stop worrying 0  Worry too much - different things 0  Trouble relaxing 0  Restless 0  Easily annoyed or irritable 0  Afraid - awful might happen 0  Total GAD 7 Score 0  Anxiety Difficulty Not difficult at all      Review of Systems  Constitutional:  Negative for chills and fever.  HENT:  Positive for congestion and sinus pain. Negative for ear discharge, ear pain and sore throat.   Respiratory:  Negative for shortness of breath.   Cardiovascular:  Negative for chest pain.  Gastrointestinal:  Negative for abdominal pain, constipation, diarrhea, heartburn, nausea and vomiting.  Genitourinary:  Negative for dysuria, frequency and urgency.  Neurological:  Negative for dizziness and headaches.  Endo/Heme/Allergies:  Negative for polydipsia.  Psychiatric/Behavioral:  Negative for depression and suicidal ideas. The patient is not nervous/anxious.       Objective:     BP 122/80 (BP Location: Left Arm, Patient Position: Sitting, Cuff Size: Normal)   Pulse 89   Temp 98.6 F (37 C) (Oral)   Ht 4\' 10"  (1.473 m)   Wt 127 lb (57.6 kg)   SpO2 96%   BMI 26.54 kg/m  BP Readings from Last 3 Encounters:  07/16/23 122/80  06/06/23 130/84  03/25/23 120/74   Wt Readings from Last 3 Encounters:  07/16/23 127 lb (57.6 kg)  07/05/23 128 lb (58.1 kg)  06/06/23 128 lb 4 oz (58.2 kg)      Physical Exam Vitals and  nursing note reviewed.  Constitutional:      Appearance: Normal appearance.  HENT:     Right Ear: Tympanic membrane, ear canal and external ear normal.     Left Ear: Tympanic membrane, ear canal and external ear normal.     Nose: Congestion present.     Right Sinus: Maxillary sinus tenderness  and frontal sinus tenderness present.     Left Sinus: Maxillary sinus tenderness and frontal sinus tenderness present.     Mouth/Throat:     Pharynx: Oropharynx is clear.  Eyes:     Conjunctiva/sclera: Conjunctivae normal.  Cardiovascular:     Rate and Rhythm: Normal rate and regular rhythm.     Pulses: Normal pulses.     Heart sounds: Normal heart sounds.  Pulmonary:     Effort: Pulmonary effort is normal.     Breath sounds: Normal breath sounds.  Neurological:     Mental Status: She is alert and oriented to person, place, and time.  Psychiatric:        Mood and Affect: Mood normal.        Behavior: Behavior normal.        Thought Content: Thought content normal.        Judgment: Judgment normal.      No results found for any visits on 07/16/23.     The 10-year ASCVD risk score (Arnett DK, et al., 2019) is: 7.5%    Assessment & Plan:  Acute pansinusitis, recurrence not specified Assessment & Plan: Symptoms suggestive of pansinusitis.   Ongoing for two weeks; suspect bacterial involvement.   Recommendations given for OTC medications for symptom management.   Start Augmentin  antibiotics. Take 1 tablet by mouth twice daily for 7 days.  Start Benzonatate  capsules for cough. Take 1 capsule by mouth three times daily as needed for cough.  Encourage rest, increase fluid intake and humidifer.   F/u with pcp if symptom worsen or do not improve.   Orders: -     Benzonatate ; Take 1 capsule (200 mg total) by mouth 3 (three) times daily as needed.  Dispense: 20 capsule; Refill: 0 -     Amoxicillin -Pot Clavulanate; Take 1 tablet by mouth 2 (two) times daily for 7 days.  Dispense: 14  tablet; Refill: 0     Return if symptoms worsen or fail to improve.    Jolanda Nation, NP

## 2023-08-09 DIAGNOSIS — M62830 Muscle spasm of back: Secondary | ICD-10-CM | POA: Diagnosis not present

## 2023-08-09 DIAGNOSIS — M545 Low back pain, unspecified: Secondary | ICD-10-CM | POA: Diagnosis not present

## 2023-08-09 DIAGNOSIS — M9903 Segmental and somatic dysfunction of lumbar region: Secondary | ICD-10-CM | POA: Diagnosis not present

## 2023-08-09 DIAGNOSIS — M9904 Segmental and somatic dysfunction of sacral region: Secondary | ICD-10-CM | POA: Diagnosis not present

## 2023-08-14 ENCOUNTER — Other Ambulatory Visit: Payer: Self-pay | Admitting: Family Medicine

## 2023-08-22 ENCOUNTER — Ambulatory Visit: Payer: Self-pay | Admitting: Family Medicine

## 2023-08-22 ENCOUNTER — Other Ambulatory Visit (INDEPENDENT_AMBULATORY_CARE_PROVIDER_SITE_OTHER)

## 2023-08-22 DIAGNOSIS — E039 Hypothyroidism, unspecified: Secondary | ICD-10-CM

## 2023-08-22 LAB — TSH: TSH: 1.71 u[IU]/mL (ref 0.35–5.50)

## 2023-08-26 ENCOUNTER — Other Ambulatory Visit: Payer: Self-pay | Admitting: Family Medicine

## 2023-08-26 ENCOUNTER — Ambulatory Visit: Admitting: Dermatology

## 2023-08-26 ENCOUNTER — Encounter: Payer: Self-pay | Admitting: Dermatology

## 2023-08-26 DIAGNOSIS — D034 Melanoma in situ of scalp and neck: Secondary | ICD-10-CM | POA: Diagnosis not present

## 2023-08-26 DIAGNOSIS — D1801 Hemangioma of skin and subcutaneous tissue: Secondary | ICD-10-CM | POA: Diagnosis not present

## 2023-08-26 DIAGNOSIS — D692 Other nonthrombocytopenic purpura: Secondary | ICD-10-CM | POA: Diagnosis not present

## 2023-08-26 DIAGNOSIS — Z85820 Personal history of malignant melanoma of skin: Secondary | ICD-10-CM

## 2023-08-26 DIAGNOSIS — D229 Melanocytic nevi, unspecified: Secondary | ICD-10-CM

## 2023-08-26 DIAGNOSIS — W908XXA Exposure to other nonionizing radiation, initial encounter: Secondary | ICD-10-CM | POA: Diagnosis not present

## 2023-08-26 DIAGNOSIS — Z86006 Personal history of melanoma in-situ: Secondary | ICD-10-CM | POA: Diagnosis not present

## 2023-08-26 DIAGNOSIS — L821 Other seborrheic keratosis: Secondary | ICD-10-CM

## 2023-08-26 DIAGNOSIS — Z85828 Personal history of other malignant neoplasm of skin: Secondary | ICD-10-CM | POA: Diagnosis not present

## 2023-08-26 DIAGNOSIS — D039 Melanoma in situ, unspecified: Secondary | ICD-10-CM

## 2023-08-26 DIAGNOSIS — Z1283 Encounter for screening for malignant neoplasm of skin: Secondary | ICD-10-CM

## 2023-08-26 DIAGNOSIS — Z86018 Personal history of other benign neoplasm: Secondary | ICD-10-CM

## 2023-08-26 DIAGNOSIS — D492 Neoplasm of unspecified behavior of bone, soft tissue, and skin: Secondary | ICD-10-CM | POA: Diagnosis not present

## 2023-08-26 DIAGNOSIS — Z8589 Personal history of malignant neoplasm of other organs and systems: Secondary | ICD-10-CM

## 2023-08-26 DIAGNOSIS — Z86007 Personal history of in-situ neoplasm of skin: Secondary | ICD-10-CM

## 2023-08-26 DIAGNOSIS — L578 Other skin changes due to chronic exposure to nonionizing radiation: Secondary | ICD-10-CM | POA: Diagnosis not present

## 2023-08-26 DIAGNOSIS — L814 Other melanin hyperpigmentation: Secondary | ICD-10-CM | POA: Diagnosis not present

## 2023-08-26 DIAGNOSIS — D489 Neoplasm of uncertain behavior, unspecified: Secondary | ICD-10-CM

## 2023-08-26 HISTORY — DX: Melanoma in situ, unspecified: D03.9

## 2023-08-26 NOTE — Progress Notes (Signed)
 Follow-Up Visit   Subjective  Terri Espinoza is a 68 y.o. female who presents for the following: Skin Cancer Screening and Full Body Skin Exam Hx of aks and iks Hx of sccis, hx of bcc, hx mmis , hx of dysplastic nevi at left scapula needs to be rechecked. Hx of other dysplastic nevi   The patient presents for Total-Body Skin Exam (TBSE) for skin cancer screening and mole check. The patient has spots, moles and lesions to be evaluated, some may be new or changing and the patient may have concern these could be cancer.  The following portions of the chart were reviewed this encounter and updated as appropriate: medications, allergies, medical history  Review of Systems:  No other skin or systemic complaints except as noted in HPI or Assessment and Plan.  Objective  Well appearing patient in no apparent distress; mood and affect are within normal limits.  A full examination was performed including scalp, head, eyes, ears, nose, lips, neck, chest, axillae, abdomen, back, buttocks, bilateral upper extremities, bilateral lower extremities, hands, feet, fingers, toes, fingernails, and toenails. All findings within normal limits unless otherwise noted below.   Relevant physical exam findings are noted in the Assessment and Plan.  left posterior neck 0.7 cm irregular brown macule    Assessment & Plan   SKIN CANCER SCREENING PERFORMED TODAY.  ACTINIC DAMAGE - Chronic condition, secondary to cumulative UV/sun exposure - diffuse scaly erythematous macules with underlying dyspigmentation - Recommend daily broad spectrum sunscreen SPF 30+ to sun-exposed areas, reapply every 2 hours as needed.  - Staying in the shade or wearing long sleeves, sun glasses (UVA+UVB protection) and wide brim hats (4-inch brim around the entire circumference of the hat) are also recommended for sun protection.  - Call for new or changing lesions.  LENTIGINES, SEBORRHEIC KERATOSES, HEMANGIOMAS - Benign normal skin  lesions - Benign-appearing - Call for any changes  MELANOCYTIC NEVI - Tan-brown and/or pink-flesh-colored symmetric macules and papules - Benign appearing on exam today - Observation - Call clinic for new or changing moles - Recommend daily use of broad spectrum spf 30+ sunscreen to sun-exposed areas.   Purpura - Chronic; persistent and recurrent.  Treatable, but not curable. - Violaceous macules and patches - Benign - Related to trauma, age, sun damage and/or use of blood thinners, chronic use of topical and/or oral steroids - Observe - Can use OTC arnica containing moisturizer such as Dermend Bruise Formula if desired - Call for worsening or other concerns   HISTORY OF MELANOMA IN SITU - right mid back - 2011 - No evidence of recurrence today - No lymphadenopathy - Recommend regular full body skin exams - Recommend daily broad spectrum sunscreen SPF 30+ to sun-exposed areas, reapply every 2 hours as needed.  - Call if any new or changing lesions are noted between office visits    HISTORY OF BASAL CELL CARCINOMA OF THE SKIN - R prox lat calf near knee, L abdomen at costal area, chest, L anticubital, L prox antecubital, R low back - No evidence of recurrence today - Recommend regular full body skin exams - Recommend daily broad spectrum sunscreen SPF 30+ to sun-exposed areas, reapply every 2 hours as needed.  - Call if any new or changing lesions are noted between office visits     HISTORY OF SQUAMOUS CELL CARCINOMA IN SITU OF THE SKIN - 11/16/2021 - right tricep - ED&C  - No evidence of recurrence today - Recommend regular full body skin  exams - Recommend daily broad spectrum sunscreen SPF 30+ to sun-exposed areas, reapply every 2 hours as needed.  - Call if any new or changing lesions are noted between office visits  -R tricep   HISTORY OF DYSPLASTIC NEVUS No evidence of recurrence today Recommend regular full body skin exams Recommend daily broad spectrum sunscreen  SPF 30+ to sun-exposed areas, reapply every 2 hours as needed.  Call if any new or changing lesions are noted between office visits  - left scapula - severe atypia margins close - clear today at exam  - R mid medial scapula, R upper back, R post medial mid thigh, R post waistline, R sup lat buttocks   NEOPLASM OF UNCERTAIN BEHAVIOR left posterior neck Epidermal / dermal shaving  Lesion diameter (cm):  0.7 Informed consent: discussed and consent obtained   Timeout: patient name, date of birth, surgical site, and procedure verified   Procedure prep:  Patient was prepped and draped in usual sterile fashion Prep type:  Isopropyl alcohol Anesthesia: the lesion was anesthetized in a standard fashion   Anesthetic:  1% lidocaine w/ epinephrine 1-100,000 buffered w/ 8.4% NaHCO3 Instrument used: flexible razor blade   Hemostasis achieved with: pressure, aluminum chloride and electrodesiccation   Outcome: patient tolerated procedure well   Post-procedure details: sterile dressing applied and wound care instructions given   Dressing type: bandage and petrolatum    Specimen 1 - Surgical pathology Differential Diagnosis: nevus r/o dysplasia 2 pieces of tissue in bottle   Check Margins: yes Nevus r/o dysplasia  Return in about 1 year (around 08/25/2024) for TBSE.  IEleanor Blush, CMA, am acting as scribe for Alm Rhyme, MD.   Documentation: I have reviewed the above documentation for accuracy and completeness, and I agree with the above.  Alm Rhyme, MD

## 2023-08-26 NOTE — Patient Instructions (Addendum)

## 2023-08-29 DIAGNOSIS — M8588 Other specified disorders of bone density and structure, other site: Secondary | ICD-10-CM | POA: Diagnosis not present

## 2023-08-29 DIAGNOSIS — N958 Other specified menopausal and perimenopausal disorders: Secondary | ICD-10-CM | POA: Diagnosis not present

## 2023-08-29 DIAGNOSIS — Z6826 Body mass index (BMI) 26.0-26.9, adult: Secondary | ICD-10-CM | POA: Diagnosis not present

## 2023-08-29 DIAGNOSIS — Z01419 Encounter for gynecological examination (general) (routine) without abnormal findings: Secondary | ICD-10-CM | POA: Diagnosis not present

## 2023-09-02 ENCOUNTER — Ambulatory Visit: Payer: Self-pay | Admitting: Dermatology

## 2023-09-02 DIAGNOSIS — D034 Melanoma in situ of scalp and neck: Secondary | ICD-10-CM

## 2023-09-02 LAB — SURGICAL PATHOLOGY

## 2023-09-02 NOTE — Telephone Encounter (Signed)
 Corean with [facility] calling in a verbal pathology result left posterior neck MALIGNANT MELANOMA IN SITU

## 2023-09-03 ENCOUNTER — Encounter: Payer: Self-pay | Admitting: Dermatology

## 2023-09-03 NOTE — Telephone Encounter (Addendum)
 Tried calling patient to scheduled 3 month follow up for melanoma. Patient scheduled for October 28 at 10:45 am left message with patient regarding appointment and if she needed to reschedule to call us  back.  Referral placed for WLE with Dr. Paci for treatment of MMIS at Left posterior neck  ----- Message from Alm Rhyme sent at 09/02/2023  6:01 PM EDT ----- FINAL DIAGNOSIS        1. Skin, left posterior neck :       MALIGNANT MELANOMA IN SITU, PERIPHERAL AND DEEP MARGINS INVOLVED, SEE       DESCRIPTION   Cancer = Melanoma in situ Superficial Schedule with Dr Corey for Wide Local Excision Discussed with pt by phone in detail including close follow ups. Schedule pt 3 mos follow up with me for TBSE     ----- Message ----- From: Interface, Lab In Three Zero One Sent: 09/02/2023   2:28 PM EDT To: Alm JAYSON Rhyme, MD

## 2023-09-04 NOTE — Progress Notes (Signed)
 Patient scheduled for WLE of MMIS at left posterior neck and 10/30/2023 at 10:45 am with Dr. Paci

## 2023-09-06 DIAGNOSIS — M62830 Muscle spasm of back: Secondary | ICD-10-CM | POA: Diagnosis not present

## 2023-09-06 DIAGNOSIS — M545 Low back pain, unspecified: Secondary | ICD-10-CM | POA: Diagnosis not present

## 2023-09-06 DIAGNOSIS — M9903 Segmental and somatic dysfunction of lumbar region: Secondary | ICD-10-CM | POA: Diagnosis not present

## 2023-09-06 DIAGNOSIS — M9904 Segmental and somatic dysfunction of sacral region: Secondary | ICD-10-CM | POA: Diagnosis not present

## 2023-09-18 DIAGNOSIS — Z1231 Encounter for screening mammogram for malignant neoplasm of breast: Secondary | ICD-10-CM | POA: Diagnosis not present

## 2023-09-23 ENCOUNTER — Other Ambulatory Visit: Payer: Self-pay | Admitting: Family Medicine

## 2023-10-11 DIAGNOSIS — M545 Low back pain, unspecified: Secondary | ICD-10-CM | POA: Diagnosis not present

## 2023-10-11 DIAGNOSIS — M9903 Segmental and somatic dysfunction of lumbar region: Secondary | ICD-10-CM | POA: Diagnosis not present

## 2023-10-11 DIAGNOSIS — M62830 Muscle spasm of back: Secondary | ICD-10-CM | POA: Diagnosis not present

## 2023-10-11 DIAGNOSIS — M9904 Segmental and somatic dysfunction of sacral region: Secondary | ICD-10-CM | POA: Diagnosis not present

## 2023-10-28 ENCOUNTER — Encounter: Payer: Self-pay | Admitting: Dermatology

## 2023-10-30 ENCOUNTER — Encounter: Payer: Self-pay | Admitting: Dermatology

## 2023-10-30 ENCOUNTER — Encounter: Admitting: Dermatology

## 2023-10-30 ENCOUNTER — Ambulatory Visit (INDEPENDENT_AMBULATORY_CARE_PROVIDER_SITE_OTHER): Admitting: Dermatology

## 2023-10-30 VITALS — BP 134/66 | HR 79 | Temp 98.4°F

## 2023-10-30 DIAGNOSIS — L578 Other skin changes due to chronic exposure to nonionizing radiation: Secondary | ICD-10-CM | POA: Diagnosis not present

## 2023-10-30 DIAGNOSIS — C439 Malignant melanoma of skin, unspecified: Secondary | ICD-10-CM

## 2023-10-30 DIAGNOSIS — D034 Melanoma in situ of scalp and neck: Secondary | ICD-10-CM

## 2023-10-30 DIAGNOSIS — L814 Other melanin hyperpigmentation: Secondary | ICD-10-CM | POA: Diagnosis not present

## 2023-10-30 NOTE — Progress Notes (Unsigned)
 Follow-Up Visit   Subjective  Terri Espinoza is a 68 y.o. female who presents for the following: Mohs of a Melanoma in Situ on the left posterior neck, biopsied by Dr. Hester on 08/26/2023.  The following portions of the chart were reviewed this encounter and updated as appropriate: medications, allergies, medical history  Review of Systems:  No other skin or systemic complaints except as noted in HPI or Assessment and Plan.  Objective  Well appearing patient in no apparent distress; mood and affect are within normal limits.  A focused examination was performed of the following areas: Left posterior neck Relevant physical exam findings are noted in the Assessment and Plan.   left posterior neck Pink scar with pigment   Assessment & Plan   MALIGNANT MELANOMA OF SKIN (HCC) left posterior neck Mohs surgery  Consent obtained: written  Anticoagulation: Is the patient taking prescription anticoagulant and/or aspirin prescribed/recommended by a physician? No   Was the anticoagulation regimen changed prior to Mohs? No    Anesthesia: Anesthesia method: local infiltration Local anesthetic: lidocaine 1% WITH epi  Procedure Details: Timeout: pre-procedure verification complete Procedure Prep: patient was prepped and draped in usual sterile fashion Prep type: chlorhexidine Biopsy accession number: [id number] Biopsy lab: [facility] Pre-Op diagnosis: melanoma Melanoma subtype: in situ Surgical site (from skin exam): left posterior neck  Micrographic Surgery Details: Number of Mohs stages: 2 Post surgery depth of defect: dermis and subcutaneous fat  Stage 1    Tumor features identified on Mohs section: melanoma  Stage 2    Tumor features identified on Mohs section: no tumor identified  Skin repair   Return in about 1 month (around 11/29/2023) for Wound Check.  Terri Rollene Gobble, RN, am acting as scribe for RUFUS CHRISTELLA HOLY, MD .   10/30/2023  HISTORY OF  PRESENT ILLNESS  Terri Espinoza is seen in consultation at the request of Dr. Hester for biopsy-proven Melanoma in Situ of the left posterior neck. They note that the area has been present for about 6 months increasing in size with time.  There is no history of previous treatment.  Reports no other new or changing lesions and has no other complaints today.  Medications and allergies: see patient chart.  Review of systems: Reviewed 8 systems and notable for the above skin cancer.  All other systems reviewed are unremarkable/negative, unless noted in the HPI. Past medical history, surgical history, family history, social history were also reviewed and are noted in the chart/questionnaire.    PHYSICAL EXAMINATION  General: Well-appearing, in no acute distress, alert and oriented x 4. Vitals reviewed in chart (if available).   Skin: Exam reveals a *** cm erythematous papule and biopsy scar on the left posterior neck. There are rhytids, telangiectasias, and lentigines, consistent with photodamage. Lymph nodes: ***  Biopsy report(s) reviewed, confirming the diagnosis.   ASSESSMENT  1) Melanoma in Situ of the left posterior neck 2) photodamage 3) solar lentigines   PLAN   1. Due to location, size, histology, or recurrence and the likelihood of subclinical extension as well as the need to conserve normal surrounding tissue, the patient was deemed acceptable for Mohs micrographic surgery (MMS).  The nature and purpose of the procedure, associated benefits and risks including recurrence and scarring, possible complications such as pain, infection, and bleeding, and alternative methods of treatment if appropriate were discussed with the patient during consent. The lesion location was verified by the patient, by reviewing previous notes, pathology reports, and  by photographs as well as angulation measurements if available.  Informed consent was reviewed and signed by the patient, and timeout was  performed at 8:14 AM. See op note below.  2. For the photodamage and solar lentigines, sun protection discussed/information given on OTC sunscreens, and we recommend continued regular follow-up with primary dermatologist every 6 months or sooner for any growing, bleeding, or changing lesions. 3. Prognosis and future surveillance discussed. 4. Letter with treatment outcome sent to referring provider.   MOHS MICROGRAPHIC SURGERY AND RECONSTRUCTION  Initial size:   *** cm Surgical defect/wound size: *** cm Anesthesia:    0.33% lidocaine with 1:200,000 epinephrine EBL:    <5 mL Complications:  None Repair type:   *** SQ suture:   5-0 Monocryl Cutaneous suture:  6-0 Plain gut Final size of the repair: *** cm  Stages:  ***  Reconstruction***    Documentation: I have reviewed the above documentation for accuracy and completeness, and I agree with the above.  RUFUS CHRISTELLA HOLY, MD

## 2023-10-30 NOTE — Patient Instructions (Signed)

## 2023-10-31 ENCOUNTER — Ambulatory Visit (INDEPENDENT_AMBULATORY_CARE_PROVIDER_SITE_OTHER): Admitting: Dermatology

## 2023-10-31 VITALS — BP 107/68 | HR 91

## 2023-10-31 DIAGNOSIS — Z08 Encounter for follow-up examination after completed treatment for malignant neoplasm: Secondary | ICD-10-CM

## 2023-10-31 DIAGNOSIS — Z85820 Personal history of malignant melanoma of skin: Secondary | ICD-10-CM

## 2023-10-31 NOTE — Progress Notes (Unsigned)
 Follow-Up Visit   Subjective  Terri Espinoza is a 68 y.o. female who presents for the following: Delayed repair following mohs for a Malignant Melanoma on the left posterior neck.   The following portions of the chart were reviewed this encounter and updated as appropriate: medications, allergies, medical history  Review of Systems:  No other skin or systemic complaints except as noted in HPI or Assessment and Plan.  Objective  Well appearing patient in no apparent distress; mood and affect are within normal limits.  A focused examination was performed of the following areas: Left posterior neck Relevant physical exam findings are noted in the Assessment and Plan.   Neck - Posterior Open granulating wound   Assessment & Plan   HISTORY OF MELANOMA Neck - Posterior Skin repair - Neck - Posterior Complexity:  Complex Informed consent: discussed and consent obtained   Timeout: patient name, date of birth, surgical site, and procedure verified   Procedure prep:  Patient was prepped and draped in usual sterile fashion Prep type:  Chlorhexidine Anesthesia: the lesion was anesthetized in a standard fashion   Anesthetic:  1% lidocaine w/ epinephrine 1-100,000 buffered w/ 8.4% NaHCO3 Reason for type of repair: reduce the risk of dehiscence, infection, and necrosis   Undermining: area extensively undermined   Subcutaneous layers (deep stitches):  Suture size:  3-0 Suture type: Vicryl (polyglactin 910)   Stitches:  Buried vertical mattress Fine/surface layer approximation (top stitches):  Suture size:  5-0 Suture type: Prolene (polypropylene)   Stitches: simple interrupted   Hemostasis achieved with: electrodesiccation Outcome: patient tolerated procedure well with no complications   Post-procedure details: sterile dressing applied and wound care instructions given   Dressing type: pressure dressing and petrolatum    PROCEDURE: Advancement Flap The nature of the procedure was  discussed with the patient in detail, including alternatives.  The risks discussed included but not limited to potential for infection, bleeding, scar formation, and damage to underlying structures.  The patient understood the risks and signed the consent form (scanned into chart).  This wound was reconstructed with an advancement flap.  Local anesthesia was achieved with the anesthetic indicated above.  The operative site was prepped with a surgical antiseptic solution, and then draped with sterile towels to insure a sterile field.  The beveled edges of the wound were excised to 90 degrees relative to the surface skin plane.  The wound was undermined in all directions, and meticulous hemostasis was achieved with an electrosurgical device.  Relaxing incisions were made, if necessary, for lateral tension release.  The tissue was advanced and closed centrally, and redundant tissue was trimmed as necessary.  The wound was sutured in a layered fashion to close potential dead space and to precisely and securely approximate the wound edges.  The dimensions of the flap were:  *** cm x *** cm for a total flap surface area of *** centimeters squared (cm2).    The wound was covered with petrolatum and a dressing.  The patient understands the need to return immediately for any signs of infection to include swelling, pain, purulent discharge, localized warmth, or fever.  Contact information was provided to the patient (including after-hours pager numbers)    Return in about 10 days (around 11/10/2023) for suture removal/ 4 week follow up.  LILLETTE Rollene Gobble, RN, am acting as scribe for RUFUS CHRISTELLA HOLY, MD .   Documentation: I have reviewed the above documentation for accuracy and completeness, and I agree with the  above.  RUFUS CHRISTELLA HOLY, MD

## 2023-10-31 NOTE — Patient Instructions (Signed)

## 2023-11-01 ENCOUNTER — Encounter: Payer: Self-pay | Admitting: Dermatology

## 2023-11-07 ENCOUNTER — Encounter: Payer: Self-pay | Admitting: Dermatology

## 2023-11-11 ENCOUNTER — Ambulatory Visit (INDEPENDENT_AMBULATORY_CARE_PROVIDER_SITE_OTHER): Admitting: Dermatology

## 2023-11-11 ENCOUNTER — Encounter: Payer: Self-pay | Admitting: Dermatology

## 2023-11-11 DIAGNOSIS — Z85820 Personal history of malignant melanoma of skin: Secondary | ICD-10-CM

## 2023-11-11 DIAGNOSIS — C439 Malignant melanoma of skin, unspecified: Secondary | ICD-10-CM

## 2023-11-11 DIAGNOSIS — T1490XD Injury, unspecified, subsequent encounter: Secondary | ICD-10-CM

## 2023-11-11 DIAGNOSIS — Z48817 Encounter for surgical aftercare following surgery on the skin and subcutaneous tissue: Secondary | ICD-10-CM

## 2023-11-11 NOTE — Progress Notes (Signed)
   Follow Up Visit   Subjective  Terri Espinoza is a 68 y.o. female who presents for the following: follow up from Mohs surgery   The patient presents for follow up from Mohs surgery for a MM on the posterior neck, treated on 10/30/23, repaired with tripolar advancement flap. The patient has been bandaging the wound as directed. The endorse the following concerns: none  The following portions of the chart were reviewed this encounter and updated as appropriate: medications, allergies, medical history  Review of Systems:  No other skin or systemic complaints except as noted in HPI or Assessment and Plan.  Objective  Well appearing patient in no apparent distress; mood and affect are within normal limits.  A focal examination was performed including scalp, head, face and posterior neck. All findings within normal limits unless otherwise noted below.  Healing wound with mild erythema  Relevant physical exam findings are noted in the Assessment and Plan.       Assessment & Plan   Healing Wound s/p Mohs for MM, treated on 10/30/23, repaired with tripolar advancement flap - Reassured that wound is healing well - No evidence of infection - No swelling, induration, purulence, dehiscence, or tenderness out of proportion to the clinical exam, see photo above - Discussed that scars take up to 12 months to mature from the date of surgery - Recommend SPF 30+ to scar daily to prevent purple color from UV exposure during scar maturation process - Discussed that erythema and raised appearance of scar will fade over the next 4-6 months - OK to start scar massage at 4-6 weeks post-op - Can consider silicone based products for scar healing starting at 6 weeks post-op - Ok to continue ointment daily to wound under a bandage for another week  HISTORY OF MELANOMA - No evidence of recurrence today - No lymphadenopathy - Recommend regular full body skin exams - Recommend daily broad spectrum  sunscreen SPF 30+ to sun-exposed areas, reapply every 2 hours as needed.  - Call if any new or changing lesions are noted between office visits  Return in about 4 weeks (around 12/09/2023) for mohs follow up.  I, Darice Smock, CMA, am acting as scribe for RUFUS CHRISTELLA HOLY, MD.   Documentation: I have reviewed the above documentation for accuracy and completeness, and I agree with the above.  RUFUS CHRISTELLA HOLY, MD

## 2023-11-11 NOTE — Patient Instructions (Signed)

## 2023-11-15 DIAGNOSIS — M62830 Muscle spasm of back: Secondary | ICD-10-CM | POA: Diagnosis not present

## 2023-11-15 DIAGNOSIS — M9904 Segmental and somatic dysfunction of sacral region: Secondary | ICD-10-CM | POA: Diagnosis not present

## 2023-11-15 DIAGNOSIS — M545 Low back pain, unspecified: Secondary | ICD-10-CM | POA: Diagnosis not present

## 2023-11-15 DIAGNOSIS — M9903 Segmental and somatic dysfunction of lumbar region: Secondary | ICD-10-CM | POA: Diagnosis not present

## 2023-11-22 ENCOUNTER — Other Ambulatory Visit: Payer: Self-pay | Admitting: Family Medicine

## 2023-11-28 ENCOUNTER — Ambulatory Visit: Admitting: Dermatology

## 2023-11-28 ENCOUNTER — Encounter: Payer: Self-pay | Admitting: Dermatology

## 2023-11-28 VITALS — BP 110/61 | HR 90

## 2023-11-28 DIAGNOSIS — W908XXA Exposure to other nonionizing radiation, initial encounter: Secondary | ICD-10-CM | POA: Diagnosis not present

## 2023-11-28 DIAGNOSIS — Z86006 Personal history of melanoma in-situ: Secondary | ICD-10-CM

## 2023-11-28 DIAGNOSIS — L578 Other skin changes due to chronic exposure to nonionizing radiation: Secondary | ICD-10-CM

## 2023-11-28 DIAGNOSIS — Z48817 Encounter for surgical aftercare following surgery on the skin and subcutaneous tissue: Secondary | ICD-10-CM

## 2023-11-28 DIAGNOSIS — L905 Scar conditions and fibrosis of skin: Secondary | ICD-10-CM

## 2023-11-28 DIAGNOSIS — D034 Melanoma in situ of scalp and neck: Secondary | ICD-10-CM

## 2023-11-28 NOTE — Patient Instructions (Signed)

## 2023-11-28 NOTE — Progress Notes (Signed)
   Follow Up Visit   Subjective  SAVONNA BIRCHMEIER is a 68 y.o. female who presents for the following: follow up from Mohs surgery   The patient presents for follow up from Mohs surgery for a MM on the posterior neck, treated on 10/30/23, repaired with tripolar flap. The patient has been bandaging the wound as directed. The endorse the following concerns: none  The following portions of the chart were reviewed this encounter and updated as appropriate: medications, allergies, medical history  Review of Systems:  No other skin or systemic complaints except as noted in HPI or Assessment and Plan.  Objective  Well appearing patient in no apparent distress; mood and affect are within normal limits.  A focal examination was performed including scalp, head, face and posterior neck. All findings within normal limits unless otherwise noted below.  Healing wound with mild erythema  Relevant physical exam findings are noted in the Assessment and Plan.    Assessment & Plan    Healing Wound s/p Mohs for MM, treated on 10/30/23, repaired with tripolar advancement flap - Reassured that wound is healing well - No evidence of infection - No swelling, induration, purulence, dehiscence, or tenderness out of proportion to the clinical exam, see photo above - Discussed that scars take up to 12 months to mature from the date of surgery - Recommend SPF 30+ to scar daily to prevent purple color from UV exposure during scar maturation process - Discussed that erythema and raised appearance of scar will fade over the next 4-6 months - OK to start scar massage at 4-6 weeks post-op - Can consider silicone based products for scar healing starting at 6 weeks post-op - Ok to continue ointment daily to wound under a bandage for another week  HISTORY OF MELANOMA IN SITU - No evidence of recurrence today - Recommend regular full body skin exams - Recommend daily broad spectrum sunscreen SPF 30+ to sun-exposed  areas, reapply every 2 hours as needed.  - Call if any new or changing lesions are noted between office visits  ACTINIC DAMAGE - chronic, secondary to cumulative UV radiation exposure/sun exposure over time - diffuse scaly erythematous macules with underlying dyspigmentation - Recommend daily broad spectrum sunscreen SPF 30+ to sun-exposed areas, reapply every 2 hours as needed.  - Recommend staying in the shade or wearing long sleeves, sun glasses (UVA+UVB protection) and wide brim hats (4-inch brim around the entire circumference of the hat). - Call for new or changing lesions.   Return if symptoms worsen or fail to improve.  I, Darice Smock, CMA, am acting as scribe for RUFUS CHRISTELLA HOLY, MD.   Documentation: I have reviewed the above documentation for accuracy and completeness, and I agree with the above.  RUFUS CHRISTELLA HOLY, MD

## 2023-12-10 ENCOUNTER — Ambulatory Visit: Admitting: Dermatology

## 2023-12-10 DIAGNOSIS — D692 Other nonthrombocytopenic purpura: Secondary | ICD-10-CM

## 2023-12-10 DIAGNOSIS — L814 Other melanin hyperpigmentation: Secondary | ICD-10-CM

## 2023-12-10 DIAGNOSIS — Z85828 Personal history of other malignant neoplasm of skin: Secondary | ICD-10-CM | POA: Diagnosis not present

## 2023-12-10 DIAGNOSIS — L820 Inflamed seborrheic keratosis: Secondary | ICD-10-CM

## 2023-12-10 DIAGNOSIS — L578 Other skin changes due to chronic exposure to nonionizing radiation: Secondary | ICD-10-CM

## 2023-12-10 DIAGNOSIS — Z1283 Encounter for screening for malignant neoplasm of skin: Secondary | ICD-10-CM

## 2023-12-10 DIAGNOSIS — Z86018 Personal history of other benign neoplasm: Secondary | ICD-10-CM

## 2023-12-10 DIAGNOSIS — Z8589 Personal history of malignant neoplasm of other organs and systems: Secondary | ICD-10-CM | POA: Diagnosis not present

## 2023-12-10 DIAGNOSIS — D229 Melanocytic nevi, unspecified: Secondary | ICD-10-CM | POA: Diagnosis not present

## 2023-12-10 DIAGNOSIS — W908XXA Exposure to other nonionizing radiation, initial encounter: Secondary | ICD-10-CM

## 2023-12-10 DIAGNOSIS — Z85820 Personal history of malignant melanoma of skin: Secondary | ICD-10-CM

## 2023-12-10 NOTE — Progress Notes (Unsigned)
 Follow-Up Visit   Subjective  Terri Espinoza is a 68 y.o. female who presents for the following: Skin Cancer Screening and Full Body Skin Exam Hx melanoma in situ, hx of bcc, hx of scc , hx of dysplastic nevi  Spot at left thigh in last 6 month ago red spot   The patient presents for Total-Body Skin Exam (TBSE) for skin cancer screening and mole check. The patient has spots, moles and lesions to be evaluated, some may be new or changing and the patient may have concern these could be cancer.  The following portions of the chart were reviewed this encounter and updated as appropriate: medications, allergies, medical history  Review of Systems:  No other skin or systemic complaints except as noted in HPI or Assessment and Plan.  Objective  Well appearing patient in no apparent distress; mood and affect are within normal limits.  A full examination was performed including scalp, head, eyes, ears, nose, lips, neck, chest, axillae, abdomen, back, buttocks, bilateral upper extremities, bilateral lower extremities, hands, feet, fingers, toes, fingernails, and toenails. All findings within normal limits unless otherwise noted below.   Relevant physical exam findings are noted in the Assessment and Plan.  left thigh x 1 Erythematous stuck-on, waxy papule or plaque  Assessment & Plan   HISTORY OF MELANOMA IN SITU 08/26/2023 - left posterior neck - Immuno Mohs Dr. Corey 10/30/2023  - right mid back - 2011 - No evidence of recurrence today - No lymphadenopathy - Recommend regular full body skin exams - Recommend daily broad spectrum sunscreen SPF 30+ to sun-exposed areas, reapply every 2 hours as needed.  - Call if any new or changing lesions are noted between office visits    HISTORY OF BASAL CELL CARCINOMA OF THE SKIN 11/16/2021 left abdomen at costal area - [facility]  06/22/2020 - right proximal lateral calf near knee ED&C  12/23/2014 right low back 12/29/2013 left proximal antecubital   12/08/2009 left antecutibal  01/11/2006 chest - No evidence of recurrence today - Recommend regular full body skin exams - Recommend daily broad spectrum sunscreen SPF 30+ to sun-exposed areas, reapply every 2 hours as needed.  - Call if any new or changing lesions are noted between office visits    HISTORY OF SQUAMOUS CELL CARCINOMA IN SITU OF THE SKIN - 11/16/2021 - right tricep - ED&C 01/30/2022 - No evidence of recurrence today - Recommend regular full body skin exams - Recommend daily broad spectrum sunscreen SPF 30+ to sun-exposed areas, reapply every 2 hours as needed.  - Call if any new or changing lesions are noted between office visits   HISTORY OF DYSPLASTIC NEVUS 07/04/2022 left scapula - severe - rechecked and ok 08/26/2023 09/15/2019 right mid medial scapula - moderate to severe 12/06/2014 right superior lateral buttocks mild , right posterior waistline mild , right posterior medial mid thigh  01/24/2007 right upper back- moderate  No evidence of recurrence today Recommend regular full body skin exams Recommend daily broad spectrum sunscreen SPF 30+ to sun-exposed areas, reapply every 2 hours as needed.  Call if any new or changing lesions are noted between office visits   SKIN CANCER SCREENING PERFORMED TODAY.  ACTINIC DAMAGE - Chronic condition, secondary to cumulative UV/sun exposure - diffuse scaly erythematous macules with underlying dyspigmentation - Recommend daily broad spectrum sunscreen SPF 30+ to sun-exposed areas, reapply every 2 hours as needed.  - Staying in the shade or wearing long sleeves, sun glasses (UVA+UVB protection) and wide brim hats (  4-inch brim around the entire circumference of the hat) are also recommended for sun protection.  - Call for new or changing lesions.  LENTIGINES, SEBORRHEIC KERATOSES, HEMANGIOMAS - Benign normal skin lesions - Benign-appearing - Call for any changes  MELANOCYTIC NEVI - Tan-brown and/or pink-flesh-colored  symmetric macules and papules - Benign appearing on exam today - Observation - Call clinic for new or changing moles - Recommend daily use of broad spectrum spf 30+ sunscreen to sun-exposed areas.   Purpura - Chronic; persistent and recurrent.  Treatable, but not curable. - Violaceous macules and patches - Benign - Related to trauma, age, sun damage and/or use of blood thinners, chronic use of topical and/or oral steroids - Observe - Can use OTC arnica containing moisturizer such as Dermend Bruise Formula if desired - Call for worsening or other concerns  INFLAMED SEBORRHEIC KERATOSIS left thigh x 1 Symptomatic, irritating, patient would like treated. Destruction of lesion - left thigh x 1 Complexity: simple   Destruction method: cryotherapy   Informed consent: discussed and consent obtained   Timeout:  patient name, date of birth, surgical site, and procedure verified Lesion destroyed using liquid nitrogen: Yes   Region frozen until ice ball extended beyond lesion: Yes   Outcome: patient tolerated procedure well with no complications   Post-procedure details: wound care instructions given    Return in about 3 months (around 03/11/2024) for TBSE hx of mmis .  IEleanor Blush, CMA, am acting as scribe for Alm Rhyme, MD.   Documentation: I have reviewed the above documentation for accuracy and completeness, and I agree with the above.  Alm Rhyme, MD

## 2023-12-10 NOTE — Patient Instructions (Addendum)
 Seborrheic Keratosis  What causes seborrheic keratoses? Seborrheic keratoses are harmless, common skin growths that first appear during adult life.  As time goes by, more growths appear.  Some people may develop a large number of them.  Seborrheic keratoses appear on both covered and uncovered body parts.  They are not caused by sunlight.  The tendency to develop seborrheic keratoses can be inherited.  They vary in color from skin-colored to gray, brown, or even black.  They can be either smooth or have a rough, warty surface.   Seborrheic keratoses are superficial and look as if they were stuck on the skin.  Under the microscope this type of keratosis looks like layers upon layers of skin.  That is why at times the top layer may seem to fall off, but the rest of the growth remains and re-grows.    Treatment Seborrheic keratoses do not need to be treated, but can easily be removed in the office.  Seborrheic keratoses often cause symptoms when they rub on clothing or jewelry.  Lesions can be in the way of shaving.  If they become inflamed, they can cause itching, soreness, or burning.  Removal of a seborrheic keratosis can be accomplished by freezing, burning, or surgery. If any spot bleeds, scabs, or grows rapidly, please return to have it checked, as these can be an indication of a skin cancer.   Cryotherapy Aftercare  Wash gently with soap and water everyday.   Apply Vaseline and Band-Aid daily until healed.     Melanoma ABCDEs  Melanoma is the most dangerous type of skin cancer, and is the leading cause of death from skin disease.  You are more likely to develop melanoma if you: Have light-colored skin, light-colored eyes, or red or blond hair Spend a lot of time in the sun Tan regularly, either outdoors or in a tanning bed Have had blistering sunburns, especially during childhood Have a close family member who has had a melanoma Have atypical moles or large birthmarks  Early detection  of melanoma is key since treatment is typically straightforward and cure rates are extremely high if we catch it early.   The first sign of melanoma is often a change in a mole or a new dark spot.  The ABCDE system is a way of remembering the signs of melanoma.  A for asymmetry:  The two halves do not match. B for border:  The edges of the growth are irregular. C for color:  A mixture of colors are present instead of an even brown color. D for diameter:  Melanomas are usually (but not always) greater than 6mm - the size of a pencil eraser. E for evolution:  The spot keeps changing in size, shape, and color.  Please check your skin once per month between visits. You can use a small mirror in front and a large mirror behind you to keep an eye on the back side or your body.   If you see any new or changing lesions before your next follow-up, please call to schedule a visit.  Please continue daily skin protection including broad spectrum sunscreen SPF 30+ to sun-exposed areas, reapplying every 2 hours as needed when you're outdoors.   Staying in the shade or wearing long sleeves, sun glasses (UVA+UVB protection) and wide brim hats (4-inch brim around the entire circumference of the hat) are also recommended for sun protection.     Due to recent changes in healthcare laws, you may see results of your  pathology and/or laboratory studies on MyChart before the doctors have had a chance to review them. We understand that in some cases there may be results that are confusing or concerning to you. Please understand that not all results are received at the same time and often the doctors may need to interpret multiple results in order to provide you with the best plan of care or course of treatment. Therefore, we ask that you please give us  2 business days to thoroughly review all your results before contacting the office for clarification. Should we see a critical lab result, you will be contacted  sooner.   If You Need Anything After Your Visit  If you have any questions or concerns for your doctor, please call our main line at [phone] and press option 4 to reach your doctor's medical assistant. If no one answers, please leave a voicemail as directed and we will return your call as soon as possible. Messages left after 4 pm will be answered the following business day.   You may also send us  a message via MyChart. We typically respond to MyChart messages within 1-2 business days.  For prescription refills, please ask your pharmacy to contact our office. Our fax number is [phone].  If you have an urgent issue when the clinic is closed that cannot wait until the next business day, you can page your doctor at the number below.    Please note that while we do our best to be available for urgent issues outside of office hours, we are not available 24/7.   If you have an urgent issue and are unable to reach us , you may choose to seek medical care at your doctor's office, retail clinic, urgent care center, or emergency room.  If you have a medical emergency, please immediately call 911 or go to the emergency department.  Pager Numbers  - Dr. Hester: [phone]  - Dr. Jackquline: [phone]  - Dr. Claudene: [phone]   - Dr. Raymund: [phone]  In the event of inclement weather, please call our main line at [phone] for an update on the status of any delays or closures.  Dermatology Medication Tips: Please keep the boxes that topical medications come in in order to help keep track of the instructions about where and how to use these. Pharmacies typically print the medication instructions only on the boxes and not directly on the medication tubes.   If your medication is too expensive, please contact our office at [phone] option 4 or send us  a message through MyChart.   We are unable to tell what your co-pay for medications will be in advance as this is  different depending on your insurance coverage. However, we may be able to find a substitute medication at lower cost or fill out paperwork to get insurance to cover a needed medication.   If a prior authorization is required to get your medication covered by your insurance company, please allow us  1-2 business days to complete this process.  Drug prices often vary depending on where the prescription is filled and some pharmacies may offer cheaper prices.  The website www.goodrx.com contains coupons for medications through different pharmacies. The prices here do not account for what the cost may be with help from insurance (it may be cheaper with your insurance), but the website can give you the price if you did not use any insurance.  - You can print the associated coupon and take it with your prescription to the pharmacy.  -  You may also stop by our office during regular business hours and pick up a GoodRx coupon card.  - If you need your prescription sent electronically to a different pharmacy, notify our office through [facility] or by phone at [phone] option 4.     Si Usted Necesita Algo Despus de Su Visita  Tambin puede enviarnos un mensaje a travs de Clinical cytogeneticist. Por lo general respondemos a los mensajes de MyChart en el transcurso de 1 a 2 das hbiles.  Para renovar recetas, por favor pida a su farmacia que se ponga en contacto con nuestra oficina. Randi lakes de fax es [place] [phone].  Si tiene un asunto urgente cuando la clnica est cerrada y que no puede esperar hasta el siguiente da hbil, puede llamar/localizar a su doctor(a) al nmero que aparece a continuacin.   Por favor, tenga en cuenta que aunque hacemos todo lo posible para estar disponibles para asuntos urgentes fuera del horario de [place], no estamos disponibles las 24 horas del da, los 7 [address]  [address] de la [place].   Si tiene un problema urgente y no puede comunicarse con nosotros, puede optar por buscar  atencin mdica  en el consultorio de su doctor(a), en una clnica privada, en un centro de atencin urgente o en una sala de emergencias.  Si tiene Engineer, drilling, por favor llame inmediatamente al 911 o vaya a la sala de emergencias.  Nmeros de bper  - Dr. Hester: [phone]  - Dra. Jackquline: [phone]  - Dr. Claudene: [phone]  - Dra. Kitts: [phone]  En caso de inclemencias del [place], por favor llame a nuestra lnea principal al [phone] para una actualizacin sobre el estado de cualquier retraso o cierre.  Consejos para la medicacin en dermatologa: Por favor, guarde las cajas en las que vienen los medicamentos de uso tpico para ayudarle a seguir las instrucciones sobre dnde y cmo usarlos. Las farmacias generalmente imprimen las instrucciones del medicamento slo en las cajas y no directamente en los tubos del [place].   Si su medicamento es muy caro, por favor, pngase en contacto con landry rieger llamando al [phone] y presione la opcin 4 o envenos un mensaje a travs de Clinical cytogeneticist.   No podemos decirle cul ser su copago por los medicamentos por adelantado ya que esto es diferente dependiendo de la cobertura de su seguro. Sin embargo, es posible que podamos encontrar un medicamento sustituto a Audiological scientist un formulario para que el seguro cubra el medicamento que se considera necesario.   Si se requiere una autorizacin previa para que su compaa de seguros malta su medicamento, por favor permtanos de 1 a 2 das hbiles para completar este proceso.  Los precios de los medicamentos varan con frecuencia dependiendo del Environmental consultant de dnde se surte la receta y alguna farmacias pueden ofrecer precios ms baratos.  El sitio web www.goodrx.com tiene cupones para medicamentos de Health and safety inspector. Los precios aqu no tienen en cuenta lo que podra costar con la ayuda del seguro (puede ser ms barato con su seguro), pero el sitio web puede  darle el precio si no utiliz Tourist information centre manager.  - Puede imprimir el cupn correspondiente y llevarlo con su receta a la farmacia.  - Tambin puede pasar por nuestra oficina durante el horario de atencin regular y Education officer, museum una tarjeta de cupones de GoodRx.  - Si necesita que su receta se enve electrnicamente a Psychiatrist, informe a nuestra oficina a travs de MyChart de [employer] o por  telfono llamando al [phone] y presione la opcin 4.

## 2023-12-11 ENCOUNTER — Encounter: Payer: Self-pay | Admitting: Dermatology

## 2023-12-13 DIAGNOSIS — M62830 Muscle spasm of back: Secondary | ICD-10-CM | POA: Diagnosis not present

## 2023-12-13 DIAGNOSIS — M9903 Segmental and somatic dysfunction of lumbar region: Secondary | ICD-10-CM | POA: Diagnosis not present

## 2023-12-13 DIAGNOSIS — M545 Low back pain, unspecified: Secondary | ICD-10-CM | POA: Diagnosis not present

## 2023-12-13 DIAGNOSIS — M9904 Segmental and somatic dysfunction of sacral region: Secondary | ICD-10-CM | POA: Diagnosis not present

## 2024-01-17 DIAGNOSIS — M62830 Muscle spasm of back: Secondary | ICD-10-CM | POA: Diagnosis not present

## 2024-01-17 DIAGNOSIS — M545 Low back pain, unspecified: Secondary | ICD-10-CM | POA: Diagnosis not present

## 2024-01-17 DIAGNOSIS — M9903 Segmental and somatic dysfunction of lumbar region: Secondary | ICD-10-CM | POA: Diagnosis not present

## 2024-01-17 DIAGNOSIS — M9904 Segmental and somatic dysfunction of sacral region: Secondary | ICD-10-CM | POA: Diagnosis not present

## 2024-02-25 ENCOUNTER — Other Ambulatory Visit: Payer: Self-pay | Admitting: Family Medicine

## 2024-03-10 ENCOUNTER — Encounter: Payer: Self-pay | Admitting: Dermatology

## 2024-03-10 ENCOUNTER — Ambulatory Visit: Admitting: Dermatology

## 2024-03-10 DIAGNOSIS — D229 Melanocytic nevi, unspecified: Secondary | ICD-10-CM

## 2024-03-10 DIAGNOSIS — Z85828 Personal history of other malignant neoplasm of skin: Secondary | ICD-10-CM

## 2024-03-10 DIAGNOSIS — Z8589 Personal history of malignant neoplasm of other organs and systems: Secondary | ICD-10-CM

## 2024-03-10 DIAGNOSIS — L821 Other seborrheic keratosis: Secondary | ICD-10-CM

## 2024-03-10 DIAGNOSIS — Z86007 Personal history of in-situ neoplasm of skin: Secondary | ICD-10-CM | POA: Diagnosis not present

## 2024-03-10 DIAGNOSIS — D1801 Hemangioma of skin and subcutaneous tissue: Secondary | ICD-10-CM

## 2024-03-10 DIAGNOSIS — L814 Other melanin hyperpigmentation: Secondary | ICD-10-CM

## 2024-03-10 DIAGNOSIS — L820 Inflamed seborrheic keratosis: Secondary | ICD-10-CM | POA: Diagnosis not present

## 2024-03-10 DIAGNOSIS — Z86018 Personal history of other benign neoplasm: Secondary | ICD-10-CM

## 2024-03-10 DIAGNOSIS — W908XXA Exposure to other nonionizing radiation, initial encounter: Secondary | ICD-10-CM

## 2024-03-10 DIAGNOSIS — D692 Other nonthrombocytopenic purpura: Secondary | ICD-10-CM | POA: Diagnosis not present

## 2024-03-10 DIAGNOSIS — Z86006 Personal history of melanoma in-situ: Secondary | ICD-10-CM | POA: Diagnosis not present

## 2024-03-10 DIAGNOSIS — Z1283 Encounter for screening for malignant neoplasm of skin: Secondary | ICD-10-CM

## 2024-03-10 DIAGNOSIS — Z85820 Personal history of malignant melanoma of skin: Secondary | ICD-10-CM

## 2024-03-10 DIAGNOSIS — L578 Other skin changes due to chronic exposure to nonionizing radiation: Secondary | ICD-10-CM

## 2024-03-10 NOTE — Patient Instructions (Addendum)

## 2024-03-10 NOTE — Progress Notes (Signed)
 "  Follow-Up Visit   Subjective  Terri Espinoza is a 69 y.o. female who presents for the following: Skin Cancer Screening and Full Body Skin Exam.  Hx melanoma in situ, hx of bcc, hx of scc , hx of dysplastic nevi.   The patient presents for Total-Body Skin Exam (TBSE) for skin cancer screening and mole check. The patient has spots, moles and lesions to be evaluated, some may be new or changing and the patient may have concern these could be cancer.   The following portions of the chart were reviewed this encounter and updated as appropriate: medications, allergies, medical history  Review of Systems:  No other skin or systemic complaints except as noted in HPI or Assessment and Plan.  Objective  Well appearing patient in no apparent distress; mood and affect are within normal limits.  A full examination was performed including scalp, head, eyes, ears, nose, lips, neck, chest, axillae, abdomen, back, buttocks, bilateral upper extremities, bilateral lower extremities, hands, feet, fingers, toes, fingernails, and toenails. All findings within normal limits unless otherwise noted below.   Relevant physical exam findings are noted in the Assessment and Plan.  Right & left neck x 2 (2) Stuck on waxy paps with erythema  Assessment & Plan   SKIN CANCER SCREENING PERFORMED TODAY.  ACTINIC DAMAGE - Chronic condition, secondary to cumulative UV/sun exposure - diffuse scaly erythematous macules with underlying dyspigmentation - Recommend daily broad spectrum sunscreen SPF 30+ to sun-exposed areas, reapply every 2 hours as needed.  - Staying in the shade or wearing long sleeves, sun glasses (UVA+UVB protection) and wide brim hats (4-inch brim around the entire circumference of the hat) are also recommended for sun protection.  - Call for new or changing lesions.  LENTIGINES, SEBORRHEIC KERATOSES, HEMANGIOMAS - Benign normal skin lesions - Benign-appearing - Call for any  changes  MELANOCYTIC NEVI - Tan-brown and/or pink-flesh-colored symmetric macules and papules - Benign appearing on exam today - Observation - Call clinic for new or changing moles - Recommend daily use of broad spectrum spf 30+ sunscreen to sun-exposed areas.   HISTORY OF MELANOMA IN SITU 08/26/2023 - left posterior neck - Immuno Mohs Dr. Corey 10/30/2023  - Right mid back - 2011 - No evidence of recurrence today - No lymphadenopathy.  - Recommend regular full body skin exams - Recommend daily broad spectrum sunscreen SPF 30+ to sun-exposed areas, reapply every 2 hours as needed.  - Call if any new or changing lesions are noted between office visits    HISTORY OF BASAL CELL CARCINOMA OF THE SKIN 11/16/2021 left abdomen at costal area - [facility]  06/22/2020 - right proximal lateral calf near knee ED&C  12/23/2014 right low back 12/29/2013 left proximal antecubital  12/08/2009 left antecutibal  01/11/2006 chest - No evidence of recurrence today - Recommend regular full body skin exams - Recommend daily broad spectrum sunscreen SPF 30+ to sun-exposed areas, reapply every 2 hours as needed.  - Call if any new or changing lesions are noted between office visits    HISTORY OF SQUAMOUS CELL CARCINOMA IN SITU OF THE SKIN - 11/16/2021 - right tricep - ED&C 01/30/2022 - No evidence of recurrence today - Recommend regular full body skin exams - Recommend daily broad spectrum sunscreen SPF 30+ to sun-exposed areas, reapply every 2 hours as needed.  - Call if any new or changing lesions are noted between office visits    HISTORY OF DYSPLASTIC NEVUS 07/04/2022 left scapula - severe - rechecked  and ok 08/26/2023 09/15/2019 right mid medial scapula - moderate to severe 12/06/2014 right superior lateral buttocks mild , right posterior waistline mild , right posterior medial mid thigh  01/24/2007 right upper back- moderate  No evidence of recurrence today Recommend regular full body skin exams Recommend  daily broad spectrum sunscreen SPF 30+ to sun-exposed areas, reapply every 2 hours as needed.  Call if any new or changing lesions are noted between office visits   Purpura - Chronic; persistent and recurrent.  Treatable, but not curable. - Violaceous macules and patches - Benign - Related to trauma, age, sun damage and/or use of blood thinners, chronic use of topical and/or oral steroids - Observe - Can use OTC arnica containing moisturizer such as Dermend Bruise Formula if desired - Call for worsening or other concerns   INFLAMED SEBORRHEIC KERATOSIS (2) Right & left neck x 2 (2) Symptomatic, irritating, patient would like treated. - Destruction of lesion - Right & left neck x 2 (2) Complexity: simple   Destruction method: cryotherapy   Informed consent: discussed and consent obtained   Timeout:  patient name, date of birth, surgical site, and procedure verified Lesion destroyed using liquid nitrogen: Yes   Region frozen until ice ball extended beyond lesion: Yes   Outcome: patient tolerated procedure well with no complications   Post-procedure details: wound care instructions given   Additional details:  Prior to procedure, discussed risks of blister formation, small wound, skin dyspigmentation, or rare scar following cryotherapy. Recommend Vaseline ointment to treated areas while healing.   Return in about 3 months (around 06/08/2024) for TBSE, w/ Dr. Hester.  Terri Espinoza, CMA, am acting as scribe for Alm Hester, MD.  Documentation: I have reviewed the above documentation for accuracy and completeness, and I agree with the above.  Alm Hester, MD    "

## 2024-06-10 ENCOUNTER — Ambulatory Visit: Admitting: Dermatology

## 2024-07-08 ENCOUNTER — Ambulatory Visit

## 2024-08-25 ENCOUNTER — Ambulatory Visit: Admitting: Dermatology
